# Patient Record
Sex: Female | Born: 1980 | Race: Black or African American | Hispanic: No | Marital: Single | Smoking: Current every day smoker
Health system: Southern US, Community
[De-identification: ages and names within clinical notes are randomized; demographics above are authoritative.]

## PROBLEM LIST (undated history)

## (undated) DIAGNOSIS — Z789 Other specified health status: Secondary | ICD-10-CM

## (undated) DIAGNOSIS — G43909 Migraine, unspecified, not intractable, without status migrainosus: Secondary | ICD-10-CM

## (undated) DIAGNOSIS — F200 Paranoid schizophrenia: Secondary | ICD-10-CM

## (undated) HISTORY — PX: CYST REMOVAL NECK: SHX6281

## (undated) HISTORY — PX: NO PAST SURGERIES: SHX2092

---
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
## 2001-03-03 ENCOUNTER — Emergency Department (HOSPITAL_COMMUNITY): Admission: EM | Admit: 2001-03-03 | Discharge: 2001-03-03 | Payer: Self-pay | Admitting: Emergency Medicine

## 2001-04-06 ENCOUNTER — Other Ambulatory Visit: Admission: RE | Admit: 2001-04-06 | Discharge: 2001-04-06 | Payer: Self-pay | Admitting: *Deleted

## 2001-08-25 ENCOUNTER — Encounter: Admission: RE | Admit: 2001-08-25 | Discharge: 2001-08-25 | Payer: Self-pay | Admitting: Internal Medicine

## 2001-09-15 ENCOUNTER — Encounter: Admission: RE | Admit: 2001-09-15 | Discharge: 2001-09-15 | Payer: Self-pay | Admitting: Internal Medicine

## 2001-11-24 ENCOUNTER — Encounter: Admission: RE | Admit: 2001-11-24 | Discharge: 2001-11-24 | Payer: Self-pay | Admitting: Internal Medicine

## 2002-01-28 ENCOUNTER — Encounter: Admission: RE | Admit: 2002-01-28 | Discharge: 2002-01-28 | Payer: Self-pay | Admitting: Internal Medicine

## 2002-03-16 ENCOUNTER — Encounter: Admission: RE | Admit: 2002-03-16 | Discharge: 2002-03-16 | Payer: Self-pay | Admitting: Internal Medicine

## 2002-03-30 ENCOUNTER — Encounter: Admission: RE | Admit: 2002-03-30 | Discharge: 2002-03-30 | Payer: Self-pay | Admitting: Internal Medicine

## 2002-07-25 ENCOUNTER — Encounter: Admission: RE | Admit: 2002-07-25 | Discharge: 2002-07-25 | Payer: Self-pay | Admitting: Internal Medicine

## 2002-10-12 ENCOUNTER — Ambulatory Visit (HOSPITAL_COMMUNITY): Admission: RE | Admit: 2002-10-12 | Discharge: 2002-10-12 | Payer: Self-pay | Admitting: Internal Medicine

## 2003-02-18 HISTORY — PX: CYST REMOVAL NECK: SHX6281

## 2003-03-09 ENCOUNTER — Inpatient Hospital Stay (HOSPITAL_COMMUNITY): Admission: AD | Admit: 2003-03-09 | Discharge: 2003-03-11 | Payer: Self-pay | Admitting: Internal Medicine

## 2003-03-09 ENCOUNTER — Encounter (INDEPENDENT_AMBULATORY_CARE_PROVIDER_SITE_OTHER): Payer: Self-pay | Admitting: Specialist

## 2003-10-09 ENCOUNTER — Encounter (INDEPENDENT_AMBULATORY_CARE_PROVIDER_SITE_OTHER): Payer: Self-pay | Admitting: Specialist

## 2003-10-09 ENCOUNTER — Ambulatory Visit (HOSPITAL_COMMUNITY): Admission: RE | Admit: 2003-10-09 | Discharge: 2003-10-09 | Payer: Self-pay | Admitting: Otolaryngology

## 2003-10-09 ENCOUNTER — Ambulatory Visit (HOSPITAL_BASED_OUTPATIENT_CLINIC_OR_DEPARTMENT_OTHER): Admission: RE | Admit: 2003-10-09 | Discharge: 2003-10-09 | Payer: Self-pay | Admitting: Otolaryngology

## 2003-10-22 ENCOUNTER — Emergency Department (HOSPITAL_COMMUNITY): Admission: EM | Admit: 2003-10-22 | Discharge: 2003-10-22 | Payer: Self-pay | Admitting: Emergency Medicine

## 2007-06-04 ENCOUNTER — Emergency Department (HOSPITAL_COMMUNITY): Admission: EM | Admit: 2007-06-04 | Discharge: 2007-06-04 | Payer: Self-pay | Admitting: Emergency Medicine

## 2007-12-08 ENCOUNTER — Emergency Department (HOSPITAL_COMMUNITY): Admission: EM | Admit: 2007-12-08 | Discharge: 2007-12-08 | Payer: Self-pay | Admitting: Emergency Medicine

## 2009-02-01 ENCOUNTER — Inpatient Hospital Stay (HOSPITAL_COMMUNITY): Admission: AD | Admit: 2009-02-01 | Discharge: 2009-02-01 | Payer: Self-pay | Admitting: Obstetrics & Gynecology

## 2009-02-28 ENCOUNTER — Encounter: Payer: Self-pay | Admitting: Obstetrics & Gynecology

## 2009-02-28 ENCOUNTER — Ambulatory Visit: Payer: Self-pay | Admitting: Family Medicine

## 2009-02-28 LAB — CONVERTED CEMR LAB
Antibody Screen: NEGATIVE
Basophils Absolute: 0 10*3/uL (ref 0.0–0.1)
Basophils Relative: 0 % (ref 0–1)
Eosinophils Absolute: 0.2 10*3/uL (ref 0.0–0.7)
Eosinophils Relative: 2 % (ref 0–5)
HCT: 37.9 % (ref 36.0–46.0)
Hemoglobin: 12.7 g/dL (ref 12.0–15.0)
Lymphocytes Relative: 19 % (ref 12–46)
MCHC: 33.5 g/dL (ref 30.0–36.0)
MCV: 85.2 fL (ref 78.0–100.0)
Monocytes Absolute: 0.6 10*3/uL (ref 0.1–1.0)
Platelets: 251 10*3/uL (ref 150–400)
RDW: 13.7 % (ref 11.5–15.5)
Rh Type: POSITIVE

## 2009-03-05 ENCOUNTER — Other Ambulatory Visit: Admission: RE | Admit: 2009-03-05 | Discharge: 2009-03-05 | Payer: Self-pay | Admitting: Obstetrics and Gynecology

## 2009-03-05 ENCOUNTER — Ambulatory Visit: Payer: Self-pay | Admitting: Obstetrics and Gynecology

## 2009-03-07 ENCOUNTER — Ambulatory Visit (HOSPITAL_COMMUNITY): Admission: RE | Admit: 2009-03-07 | Discharge: 2009-03-07 | Payer: Self-pay | Admitting: Obstetrics & Gynecology

## 2009-03-29 ENCOUNTER — Ambulatory Visit: Payer: Self-pay | Admitting: Obstetrics & Gynecology

## 2009-04-05 ENCOUNTER — Ambulatory Visit: Payer: Self-pay | Admitting: Obstetrics & Gynecology

## 2009-05-01 ENCOUNTER — Ambulatory Visit: Payer: Self-pay | Admitting: Obstetrics and Gynecology

## 2009-05-01 ENCOUNTER — Encounter: Payer: Self-pay | Admitting: Obstetrics & Gynecology

## 2009-05-01 LAB — CONVERTED CEMR LAB
MCHC: 33.6 g/dL (ref 30.0–36.0)
RBC: 4.2 M/uL (ref 3.87–5.11)
RDW: 14.3 % (ref 11.5–15.5)

## 2009-05-23 ENCOUNTER — Ambulatory Visit: Payer: Self-pay | Admitting: Obstetrics & Gynecology

## 2009-06-26 ENCOUNTER — Ambulatory Visit: Payer: Self-pay | Admitting: Obstetrics & Gynecology

## 2009-06-26 ENCOUNTER — Encounter: Payer: Self-pay | Admitting: Obstetrics & Gynecology

## 2009-06-26 LAB — CONVERTED CEMR LAB: Chlamydia, DNA Probe: NEGATIVE

## 2009-06-27 ENCOUNTER — Encounter: Payer: Self-pay | Admitting: Obstetrics & Gynecology

## 2009-07-04 ENCOUNTER — Ambulatory Visit: Payer: Self-pay | Admitting: Obstetrics & Gynecology

## 2009-07-24 ENCOUNTER — Ambulatory Visit (HOSPITAL_COMMUNITY): Admission: RE | Admit: 2009-07-24 | Discharge: 2009-07-25 | Payer: Self-pay | Admitting: Family Medicine

## 2009-07-25 ENCOUNTER — Ambulatory Visit: Payer: Self-pay | Admitting: Advanced Practice Midwife

## 2009-07-25 ENCOUNTER — Inpatient Hospital Stay (HOSPITAL_COMMUNITY): Admission: AD | Admit: 2009-07-25 | Discharge: 2009-07-27 | Payer: Self-pay | Admitting: Obstetrics & Gynecology

## 2009-07-25 ENCOUNTER — Ambulatory Visit: Payer: Self-pay | Admitting: Family Medicine

## 2009-09-11 ENCOUNTER — Ambulatory Visit: Payer: Self-pay | Admitting: Obstetrics & Gynecology

## 2010-05-06 LAB — RPR: RPR Ser Ql: NONREACTIVE

## 2010-05-06 LAB — CBC
MCHC: 33.5 g/dL (ref 30.0–36.0)
MCV: 88.4 fL (ref 78.0–100.0)
Platelets: 200 10*3/uL (ref 150–400)
WBC: 13.9 10*3/uL — ABNORMAL HIGH (ref 4.0–10.5)

## 2010-05-20 LAB — WET PREP, GENITAL
Clue Cells Wet Prep HPF POC: NONE SEEN
Trich, Wet Prep: NONE SEEN

## 2010-05-20 LAB — GC/CHLAMYDIA PROBE AMP, GENITAL: Chlamydia, DNA Probe: NEGATIVE

## 2010-05-20 LAB — URINALYSIS, ROUTINE W REFLEX MICROSCOPIC
Bilirubin Urine: NEGATIVE
Ketones, ur: NEGATIVE mg/dL
Nitrite: POSITIVE — AB
Urobilinogen, UA: 0.2 mg/dL (ref 0.0–1.0)

## 2010-07-02 NOTE — Assessment & Plan Note (Signed)
Angelica [name], Angelica [name]                 ACCOUNT NO.:  [id number]   MEDICAL RECORD NO.:  [id number]          PATIENT TYPE:  POB   LOCATION:  [facility] at [facility]         FACILITY:  [facility]   PHYSICIAN:  Scheryl Darter, MD       DATE OF BIRTH:  1980-05-11   DATE OF SERVICE:  09/11/2009                                  CLINIC NOTE   The patient is postpartum from vaginal delivery 6 weeks ago.  Infant is  doing well.  She delivered a live born female, 8 pounds and 6 ounces on  July 25, 2009.  No complications.  She has abstained from intercourse  since delivery.  She had some bleeding for about 3 weeks and now has a  mucus discharge.  There have been no complaints of itching or odor.  She  says she thinks she may have had some postpartum depression with her  first delivery, but she feels like she is doing better this time.  The  patient had a LSIL Pap during this pregnancy and colposcopy was done in  February and plan was to repeat colposcopy postpartum.   PHYSICAL EXAMINATION:  GENERAL:  The patient's affect appears normal.  VITAL SIGNS:  Weight is 198 pounds, height 64 inches, blood pressure  114/89.  ABDOMEN:  Soft, nontender, no mass.  PELVIC:  External genitalia, vagina and cervix showed some slight mucus  at os, but no abnormal discharge.  Uterus normal size, nontender, no  mass.   IMPRESSION:  1. The patient is doing well postpartum.  2. The patient wishes to start oral contraceptives with history of      abnormal Pap smear.   PLAN:  The patient will return for repeat colposcopy.  Gave prescription  for [facility] as requested.  She scored 8 on the postnatal depression  scale which is normal.      Scheryl Darter, MD     JA/MEDQ  D:  09/11/2009  T:  09/12/2009  Job:  [id number]

## 2010-07-05 NOTE — Op Note (Signed)
NAME:  Angelica [name], Angelica [name]                           ACCOUNT NO.:  [id number]   MEDICAL RECORD NO.:  [id number]                   PATIENT TYPE:  AMB   LOCATION:  [facility]                                  FACILITY:  [facility]   PHYSICIAN:  Jefry H. Pollyann Kennedy, M.D.                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   PROCEDURE:  __________ procedure.   HISTORY:  This [name] 30 year old with [name] history of an anterior neck mass.  Risks, benefits, alternatives and complications of the procedure were  explained to the patient who seemed to understand and agreed with surgery.   PROCEDURE:  The patient was taken to the operating room and placed on the  operating table in the supine position.  Following induction of general  endotracheal anesthesia, the anterior neck was prepped and draped in [name]  standard fashion.  [name] transverse incision was outlined in [name] cervical skin  crease just above the thyroid notch.  Incision was created using  electrocautery and electrocautery was then used to dissect through the  superficial fascia.  The strap muscles were identified and reflected  laterally.  Just above the thyroid notch was [name] cystic mass.  The mass was  mobile and slightly to the left of midline.  Careful dissection around the  mass was accomplished to remove the mass in its entirety without disturbing  any surrounding structures.  During the dissection, during manipulation of  the mass, [name] small tear created and cloudy mucoid type material was produced  within the mass and this was suctioned from the operative field.  The  dissection continued up to the attachment to the midline of the hyoid bone.  The muscles were dissected off of the hyoid laterally to the  lesser horns  and superiorly and inferiorly.  [name] bone cutting rongeur was used to divide  the hyoid on either side at the lesser horn and this was kept in continuity  with the cyst.  Dissection superior to the hyoid through the muscle layers  did not reveal any extension of the cyst or any type of sinus tract.  The  specimen was delivered from the wound and sent for pathologic evaluation.  The wound was irrigated with saline.  Hemostasis was completed using  electrocautery and the wound was then closed in layers with [name] rubber band  drain in place.  The strap muscles were reapproximated using 4-0 chromic  suture as well as the platysma layer.  The skin was reapproximated with  running 5-0 nylon.  The drain was secured in place with nylon as well.  [name]  sterile dressing was applied.  The patient was  then awakened, extubated and  transferred to recovery in stable condition.                                               Jefry H. Pollyann Kennedy, M.D.    JHR/MEDQ  D:  10/09/2003  T:  10/09/2003  Job:  [id number]

## 2012-01-17 ENCOUNTER — Encounter (HOSPITAL_COMMUNITY): Payer: Self-pay | Admitting: Emergency Medicine

## 2012-01-17 ENCOUNTER — Emergency Department (HOSPITAL_COMMUNITY)
Admission: EM | Admit: 2012-01-17 | Discharge: 2012-01-17 | Disposition: A | Discharge status: Home / Self Care | Payer: Medicaid Other | Attending: Emergency Medicine | Admitting: Emergency Medicine

## 2012-01-17 DIAGNOSIS — Z3202 Encounter for pregnancy test, result negative: Secondary | ICD-10-CM | POA: Insufficient documentation

## 2012-01-17 DIAGNOSIS — F17200 Nicotine dependence, unspecified, uncomplicated: Secondary | ICD-10-CM | POA: Insufficient documentation

## 2012-01-17 DIAGNOSIS — R300 Dysuria: Secondary | ICD-10-CM | POA: Insufficient documentation

## 2012-01-17 DIAGNOSIS — R109 Unspecified abdominal pain: Secondary | ICD-10-CM | POA: Insufficient documentation

## 2012-01-17 LAB — URINE MICROSCOPIC-ADD ON

## 2012-01-17 LAB — URINALYSIS, ROUTINE W REFLEX MICROSCOPIC
Glucose, UA: NEGATIVE mg/dL
Protein, ur: NEGATIVE mg/dL
pH: 6.5 (ref 5.0–8.0)

## 2012-01-17 LAB — PREGNANCY, URINE: Preg Test, Ur: NEGATIVE

## 2012-01-17 LAB — WET PREP, GENITAL
Clue Cells Wet Prep HPF POC: NONE SEEN
Trich, Wet Prep: NONE SEEN

## 2012-01-17 MED ORDER — HYDROCODONE-ACETAMINOPHEN 5-500 MG PO TABS: 1.0000 | ORAL_TABLET | Freq: Four times a day (QID) | ORAL | Status: DC | PRN

## 2012-01-17 NOTE — ED Notes (Signed)
Lab called to advised that the GC/Chlamydia swap needs to be redone with blue swab instead of white.  Advised PA - will repeat.

## 2012-01-17 NOTE — ED Provider Notes (Signed)
Medical screening examination/treatment/procedure(s) were performed by non-physician practitioner and as supervising physician I was immediately available for consultation/collaboration.   Dione Booze, MD 01/17/12 [phone]

## 2012-01-17 NOTE — ED Provider Notes (Signed)
History     CSN: [id number]  Arrival date & time 01/17/12  4098   First MD Initiated Contact with Patient 01/17/12 [phone]      Chief Complaint  Patient presents with  . Abdominal Pain    (Consider location/radiation/quality/duration/timing/severity/associated sxs/prior treatment) HPI  31 year old female with no significant medical history is for evaluations of dysuria.  Patient reports gradual onset of intermittent low abdomen pressure sensation along with the urge to urinate but having difficulty for the past 3 days.  And also affect low back..  No fever, Chills, nausea, vomiting, diarrhea, burning urination, vaginal discharge, or rash.  LMP Nov 21st.  No hx of diabetes, or kidney stone.  Has tried Azo at home with minimal relief.    History reviewed. No pertinent past medical history.  History reviewed. No pertinent past surgical history.  No family history on file.  History  Substance Use Topics  . Smoking status: Current Every Day Smoker -- 0.5 packs/day for 5 years    Types: Cigarettes  . Smokeless tobacco: Not on file  . Alcohol Use: Yes    OB History    Grav Para Term Preterm Abortions TAB SAB Ect Mult Living                  Review of Systems  Constitutional: Negative for fever.  Gastrointestinal: Negative for nausea, vomiting and diarrhea.  Genitourinary: Positive for dysuria, flank pain and difficulty urinating. Negative for vaginal bleeding, vaginal discharge and pelvic pain.  Skin: Negative for rash.  Neurological: Negative for numbness.    Allergies  Review of patient's allergies indicates no known allergies.  Home Medications   Current Outpatient Rx  Name  Route  Sig  Dispense  Refill  . AZO TABS PO   Oral   Take 1 tablet by mouth 2 (two) times daily.           BP 120/59  Pulse 86  Temp 98.1 F (36.7 C) (Oral)  Resp 20  Ht 5\' 3"  (1.6 m)  Wt 190 lb (86.183 kg)  BMI 33.66 kg/m2  SpO2 98%  LMP 01/08/2012  Physical Exam  Nursing note  and vitals reviewed. Constitutional: She appears well-developed and well-nourished. No distress.  HENT:  Head: Normocephalic and atraumatic.  Eyes: Conjunctivae normal are normal.  Neck: Normal range of motion. Neck supple.  Cardiovascular: Normal rate and regular rhythm.   Pulmonary/Chest: Effort normal and breath sounds normal. She exhibits no tenderness.  Abdominal: Soft. There is tenderness (mild tenderness to LLQ without guarding, rebound, or hernia noted.) in the left lower quadrant. There is no rebound, no CVA tenderness, no tenderness at McBurney's point and negative Murphy's sign.       No CVA tenderness  Genitourinary: Vagina normal and uterus normal. There is no rash or lesion on the right labia. There is no rash or lesion on the left labia. Cervix exhibits no motion tenderness and no discharge. Right adnexum displays no mass and no tenderness. Left adnexum displays no mass and no tenderness. No erythema, tenderness or bleeding around the vagina. No vaginal discharge found.       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  Lymphadenopathy:       Right: No inguinal adenopathy present.       Left: No inguinal adenopathy present.    ED Course  Procedures (including critical care time)   Labs Reviewed  URINALYSIS, ROUTINE W  REFLEX MICROSCOPIC  PREGNANCY, URINE  WET PREP, GENITAL  GC/CHLAMYDIA PROBE AMP   No results found.   No diagnosis found.  Results for orders placed during the hospital encounter of 01/17/12  URINALYSIS, ROUTINE W REFLEX MICROSCOPIC      Component Value Range   Color, Urine YELLOW  YELLOW   APPearance CLEAR  CLEAR   Specific Gravity, Urine 1.021  1.005 - 1.030   pH 6.5  5.0 - 8.0   Glucose, UA NEGATIVE  NEGATIVE mg/dL   Hgb urine dipstick NEGATIVE  NEGATIVE   Bilirubin Urine NEGATIVE  NEGATIVE   Ketones, ur NEGATIVE  NEGATIVE mg/dL   Protein, ur NEGATIVE  NEGATIVE mg/dL    Urobilinogen, UA 1.0  0.0 - 1.0 mg/dL   Nitrite NEGATIVE  NEGATIVE   Leukocytes, UA TRACE (*) NEGATIVE  PREGNANCY, URINE      Component Value Range   Preg Test, Ur NEGATIVE  NEGATIVE  WET PREP, GENITAL      Component Value Range   Yeast Wet Prep HPF POC NONE SEEN  NONE SEEN   Trich, Wet Prep NONE SEEN  NONE SEEN   Clue Cells Wet Prep HPF POC NONE SEEN  NONE SEEN   WBC, Wet Prep HPF POC MANY (*) NONE SEEN  URINE MICROSCOPIC-ADD ON      Component Value Range   Squamous Epithelial / LPF MANY (*) RARE   WBC, UA 0-2  <3 WBC/hpf   RBC / HPF 0-2  <3 RBC/hpf   Bacteria, UA RARE  RARE   No results found.    MDM  Pt presents with dysuria and pain to her LLQ. Non surgical abdomen, afebrile, VSS.  Will check UA, pelvic exam, pregnancy test.    1:13 PM Pelvic examination is unremarkable, low suspicion for PID. Doubt ovarian torsion, tuboovarian abscess, diverticulitis.  Doubt kidney stone.  UA shows no evidence of UTI, preg test neg.  GC/Chlamydia culture sent.  I recommend pt to f/u with PCP for further evauation.  Strict return precaution including fever, persistent vomit, worsening abd pain.  Pt voice understanding and agrees with plan.  Able to tolerates PO.  Stable for discharge.    BP 97/48  Pulse 58  Temp 98.1 F (36.7 C) (Oral)  Resp 18  Ht 5\' 3"  (1.6 m)  Wt 190 lb (86.183 kg)  BMI 33.66 kg/m2  SpO2 100%  LMP 01/08/2012   I have reviewed nursing notes and vital signs.  I reviewed available ER/hospitalization records thought the EMR       Fayrene Helper, [place] 01/17/12 1318

## 2012-01-17 NOTE — ED Notes (Signed)
Patient claims having trouble urinating.   Has been taking AZO since Thursday without relief.  Patient claims LLQ pain into L flank pain.

## 2012-01-18 ENCOUNTER — Inpatient Hospital Stay (HOSPITAL_COMMUNITY)
Admission: AD | Admit: 2012-01-18 | Discharge: 2012-01-18 | Disposition: A | Discharge status: Home / Self Care | Payer: Medicaid Other | Source: Ambulatory Visit | Attending: Obstetrics & Gynecology | Admitting: Obstetrics & Gynecology

## 2012-01-18 ENCOUNTER — Encounter (HOSPITAL_COMMUNITY): Payer: Self-pay | Admitting: Obstetrics and Gynecology

## 2012-01-18 ENCOUNTER — Inpatient Hospital Stay (HOSPITAL_COMMUNITY): Payer: Medicaid Other

## 2012-01-18 DIAGNOSIS — K5900 Constipation, unspecified: Secondary | ICD-10-CM

## 2012-01-18 DIAGNOSIS — R1032 Left lower quadrant pain: Secondary | ICD-10-CM

## 2012-01-18 DIAGNOSIS — R350 Frequency of micturition: Secondary | ICD-10-CM | POA: Insufficient documentation

## 2012-01-18 HISTORY — DX: Other specified health status: Z78.9

## 2012-01-18 LAB — URINALYSIS, ROUTINE W REFLEX MICROSCOPIC
Bilirubin Urine: NEGATIVE
Hgb urine dipstick: NEGATIVE
Ketones, ur: 15 mg/dL — AB
Specific Gravity, Urine: 1.02 (ref 1.005–1.030)
Urobilinogen, UA: 0.2 mg/dL (ref 0.0–1.0)
pH: 6 (ref 5.0–8.0)

## 2012-01-18 MED ORDER — POLYETHYLENE GLYCOL 3350 17 G PO PACK: 17.0000 g | PACK | Freq: Every day | ORAL | Status: DC

## 2012-01-18 MED ORDER — KETOROLAC TROMETHAMINE 60 MG/2ML IM SOLN
60.0000 mg | Freq: Once | INTRAMUSCULAR | Status: AC
  Administered 2012-01-18: 60 mg via INTRAMUSCULAR
  Filled 2012-01-18: qty 2

## 2012-01-18 MED ORDER — PHOSPHATE ENEMA 7-19 GM/118ML RE ENEM: 1.0000 | ENEMA | Freq: Once | RECTAL | Status: DC | PRN

## 2012-01-18 NOTE — MAU Note (Signed)
"  I have been having pain in my LLQ since Wednesday or Thursday.  It is like a spasm feeling that comes and goes.  It aches and I'm having trouble sleeping at night.  It is difficult passing urine for the past couple of days.  I have run water in order to be able to use the BR.  No N/V/D."

## 2012-01-18 NOTE — MAU Note (Signed)
Pt reports having LLQ pain that has been for the past 4 days. Feels like a knott in her side. Hurts more in the morning and evening time. Though it may be a UTI went to MD on Friday and it was ruled out. Still having pain. Pt taking AZO for pain but not helping much. Took some Ibuprofen with some relief.

## 2012-01-18 NOTE — MAU Provider Note (Signed)
History     CSN: [id number]  Arrival date and time: 01/18/12 4098   First Provider Initiated Contact with Patient 01/18/12 [phone]      Chief Complaint  Patient presents with  . Abdominal Pain   HPI 31 y.o. [id number] with left sided pelvic pain x 3-4 days, also c/o urinary frequency, urgency and hesitancy. Denies hematuria, vaginal discharge, fever, chills, n/v. + Constipation - no BM in 3-4 days. Pt was evaluated yesterday at [facility] - UPT, urinalysis, wet prep and GC/CT were all negative. Pain continues, however.    Past Medical History  Diagnosis Date  . No pertinent past medical history     Past Surgical History  Procedure Date  . Cyst removal neck     History reviewed. No pertinent family history.  History  Substance Use Topics  . Smoking status: Current Every Day Smoker -- 0.5 packs/day for 5 years    Types: Cigarettes  . Smokeless tobacco: Not on file  . Alcohol Use: 0.0 oz/week    7-8 Glasses of wine per week    Allergies: No Known Allergies  Prescriptions prior to admission  Medication Sig Dispense Refill  . ibuprofen (ADVIL,MOTRIN) 200 MG tablet Take 800 mg by mouth every 6 (six) hours as needed.        Review of Systems  Constitutional: Negative.   Respiratory: Negative.   Cardiovascular: Negative.   Gastrointestinal: Positive for abdominal pain and constipation. Negative for nausea, vomiting and diarrhea.  Genitourinary: Positive for urgency and frequency. Negative for dysuria, hematuria and flank pain.       Negative for vaginal bleeding, vaginal discharge  Musculoskeletal: Negative.   Neurological: Negative.   Psychiatric/Behavioral: Negative.    Physical Exam   Blood pressure 133/63, pulse 87, temperature 97.8 F (36.6 C), temperature source Oral, resp. rate 18, height 5\' 3"  (1.6 m), weight 189 lb 3.2 oz (85.821 kg), last menstrual period 01/08/2012.  Physical Exam  Nursing note and vitals reviewed. Constitutional: She is oriented to person,  place, and time. She appears well-developed and well-nourished. No distress.  Cardiovascular: Normal rate.   Respiratory: Effort normal.  GI: Soft. She exhibits no distension and no mass. There is tenderness (LLQ). There is no rebound and no guarding.  Musculoskeletal: Normal range of motion.  Neurological: She is alert and oriented to person, place, and time.  Skin: Skin is warm and dry.  Psychiatric: She has a normal mood and affect.    MAU Course  Procedures  Results for orders placed during the hospital encounter of 01/18/12 (from the past 24 hour(s))  URINALYSIS, ROUTINE W REFLEX MICROSCOPIC     Status: Abnormal   Collection Time   01/18/12 10:42 AM      Component Value Range   Color, Urine YELLOW  YELLOW   APPearance CLEAR  CLEAR   Specific Gravity, Urine 1.020  1.005 - 1.030   pH 6.0  5.0 - 8.0   Glucose, UA NEGATIVE  NEGATIVE mg/dL   Hgb urine dipstick NEGATIVE  NEGATIVE   Bilirubin Urine NEGATIVE  NEGATIVE   Ketones, ur 15 (*) NEGATIVE mg/dL   Protein, ur NEGATIVE  NEGATIVE mg/dL   Urobilinogen, UA 0.2  0.0 - 1.0 mg/dL   Nitrite NEGATIVE  NEGATIVE   Leukocytes, UA NEGATIVE  NEGATIVE   US Transvaginal Non-ob  01/18/2012  *RADIOLOGY REPORT*  Clinical Data: Left lower quadrant pain for 4 days.  LMP 01/08/2012.  TRANSABDOMINAL AND TRANSVAGINAL ULTRASOUND OF PELVIS Technique:  Both transabdominal and transvaginal  ultrasound examinations of the pelvis were performed. Transabdominal technique was performed for global imaging of the pelvis including uterus, ovaries, adnexal regions, and pelvic cul-de-sac.  It was necessary to proceed with endovaginal exam following the transabdominal exam to visualize the adnexa.  Comparison:  None  Findings:  Uterus: Measures 7.9 x 4.6 x 5.2 cm.  Anteverted.  Within normal limits for echotexture.  No focal mass.  Endometrium: Normal in thickness and appearance.  Measures 8.8 mm.  Right ovary:  Normal appearance/no adnexal mass.  Measures 3.9 x 3.1 x  2.7 cm and contains a dominant follicle.  Left ovary: Normal appearance/no adnexal mass.  Measures 3.3 x 1.6 x 2.6 cm.  Other findings: No free fluid  IMPRESSION: Normal study. No evidence of pelvic mass or other significant abnormality.   Original Report Authenticated By: Britta Mccreedy, M.D.    US Pelvis Complete  01/18/2012  *RADIOLOGY REPORT*  Clinical Data: Left lower quadrant pain for 4 days.  LMP 01/08/2012.  TRANSABDOMINAL AND TRANSVAGINAL ULTRASOUND OF PELVIS Technique:  Both transabdominal and transvaginal ultrasound examinations of the pelvis were performed. Transabdominal technique was performed for global imaging of the pelvis including uterus, ovaries, adnexal regions, and pelvic cul-de-sac.  It was necessary to proceed with endovaginal exam following the transabdominal exam to visualize the adnexa.  Comparison:  None  Findings:  Uterus: Measures 7.9 x 4.6 x 5.2 cm.  Anteverted.  Within normal limits for echotexture.  No focal mass.  Endometrium: Normal in thickness and appearance.  Measures 8.8 mm.  Right ovary:  Normal appearance/no adnexal mass.  Measures 3.9 x 3.1 x 2.7 cm and contains a dominant follicle.  Left ovary: Normal appearance/no adnexal mass.  Measures 3.3 x 1.6 x 2.6 cm.  Other findings: No free fluid  IMPRESSION: Normal study. No evidence of pelvic mass or other significant abnormality.   Original Report Authenticated By: Britta Mccreedy, M.D.     Assessment and Plan   1. Left lower quadrant pain   2. Constipation       Medication List     As of 01/18/2012  3:20 PM    START taking these medications         phosphate enema   Commonly known as: FLEET   Place 1 enema rectally once as needed for constipation.      polyethylene glycol packet   Commonly known as: MIRALAX / GLYCOLAX   Take 17 g by mouth daily.      CONTINUE taking these medications         ibuprofen 200 MG tablet   Commonly known as: ADVIL,MOTRIN          Where to get your medications    These  are the prescriptions that you need to pick up. We sent them to a specific pharmacy, so you will need to go there to get them.   [facility] [address] [place], [place] [address]    [address]  [address]    Phone: [phone]        phosphate enema   polyethylene glycol packet            Follow-up Information    Follow up with a primary care doctor. (As needed)            Navjot Pilgrim 01/18/2012, 9:45 AM

## 2012-01-21 NOTE — MAU Provider Note (Signed)
Attestation of Attending Supervision of Advanced Practitioner (CNM/NP): Evaluation and management procedures were performed by the Advanced Practitioner under my supervision and collaboration. I have reviewed the Advanced Practitioner's note and chart, and I agree with the management and plan.  Akai Dollard H. 9:00 PM   

## 2012-05-08 ENCOUNTER — Encounter (HOSPITAL_COMMUNITY): Payer: Self-pay | Admitting: *Deleted

## 2012-05-08 ENCOUNTER — Emergency Department (HOSPITAL_COMMUNITY): Payer: Medicaid Other

## 2012-05-08 ENCOUNTER — Emergency Department (HOSPITAL_COMMUNITY)
Admission: EM | Admit: 2012-05-08 | Discharge: 2012-05-08 | Disposition: A | Discharge status: Home / Self Care | Payer: Medicaid Other | Attending: Emergency Medicine | Admitting: Emergency Medicine

## 2012-05-08 DIAGNOSIS — R51 Headache: Secondary | ICD-10-CM

## 2012-05-08 DIAGNOSIS — R5381 Other malaise: Secondary | ICD-10-CM | POA: Insufficient documentation

## 2012-05-08 DIAGNOSIS — Z3202 Encounter for pregnancy test, result negative: Secondary | ICD-10-CM | POA: Insufficient documentation

## 2012-05-08 DIAGNOSIS — R42 Dizziness and giddiness: Secondary | ICD-10-CM | POA: Insufficient documentation

## 2012-05-08 DIAGNOSIS — R5383 Other fatigue: Secondary | ICD-10-CM | POA: Insufficient documentation

## 2012-05-08 DIAGNOSIS — F17200 Nicotine dependence, unspecified, uncomplicated: Secondary | ICD-10-CM | POA: Insufficient documentation

## 2012-05-08 LAB — POCT I-STAT, CHEM 8
BUN: 10 mg/dL (ref 6–23)
Creatinine, Ser: 0.8 mg/dL (ref 0.50–1.10)
Glucose, Bld: 105 mg/dL — ABNORMAL HIGH (ref 70–99)
Potassium: 3.8 mEq/L (ref 3.5–5.1)
Sodium: 142 mEq/L (ref 135–145)

## 2012-05-08 MED ORDER — HYDROCODONE-ACETAMINOPHEN 5-325 MG PO TABS
2.0000 | ORAL_TABLET | Freq: Once | ORAL | Status: AC
  Administered 2012-05-08: 2 via ORAL
  Filled 2012-05-08: qty 2

## 2012-05-08 MED ORDER — HYDROCODONE-ACETAMINOPHEN 5-325 MG PO TABS: 2.0000 | ORAL_TABLET | Freq: Four times a day (QID) | ORAL | Status: DC | PRN

## 2012-05-08 NOTE — ED Notes (Signed)
MD at bedside. 

## 2012-05-08 NOTE — ED Notes (Signed)
Pt instructed to notify RN when she is able to void for urine specimen.

## 2012-05-08 NOTE — ED Notes (Signed)
Pt has been has been having increasingly painful headaches x 1.5 months.  Pt describes pain as "spasms" that last for an hour and then go away.  States pain to entire. Pt photophobic, dizzy and lightheaded but denies nausea.  No relief with ibuprofen.

## 2012-05-08 NOTE — ED Provider Notes (Signed)
History     CSN: [id number]  Arrival date & time 05/08/12  1251   First MD Initiated Contact with Patient 05/08/12 1329      Chief Complaint  Patient presents with  . Headache    (Consider location/radiation/quality/duration/timing/severity/associated sxs/prior treatment) HPI [place] of diffuse headache dull in nature, constant, accompanied by generalized weakness onset gradually 1.5 months ago. She has to do herself with ibuprofen with partial relief symptoms are accompanied by lightheadedness, sensation of vertigo intermittently. She is not vertiginous now. She is treated herself with ibuprofen with partial relief. She presents today as pain is worse over the past few days. No fever no other associated symptoms. Pain is worsened with loud noises improved after treatment with ibuprofen. Last normal menstrual period 5 days ago. She is currently on her menses Past Medical History  Diagnosis Date  . No pertinent past medical history     Past Surgical History  Procedure Laterality Date  . Cyst removal neck      No family history on file.  History  Substance Use Topics  . Smoking status: Current Every Day Smoker -- 0.50 packs/day for 5 years    Types: Cigarettes  . Smokeless tobacco: Not on file  . Alcohol Use: 0.0 oz/week    7-8 Glasses of wine per week    OB History   Grav Para Term Preterm Abortions TAB SAB Ect Mult Living   2 2 2       2       Review of Systems  HENT:       Hyperacusis  Neurological: Positive for dizziness, light-headedness and headaches. Negative for weakness.  All other systems reviewed and are negative.    Allergies  Review of patient's allergies indicates no known allergies.  Home Medications   Current Outpatient Rx  Name  Route  Sig  Dispense  Refill  . ibuprofen (ADVIL,MOTRIN) 200 MG tablet   Oral   Take 600-800 mg by mouth every 6 (six) hours as needed for pain.            BP 131/71  Pulse 69  Temp(Src) 98.2 F (36.8 C)  (Oral)  Resp 16  SpO2 99%  LMP 05/03/2012  Physical Exam  Nursing note and vitals reviewed. Constitutional: She is oriented to person, place, and time. She appears well-developed and well-nourished.  HENT:  Head: Normocephalic and atraumatic.  Eyes: Conjunctivae are normal. Pupils are equal, round, and reactive to light.  Fundi benign  Neck: Neck supple. No tracheal deviation present. No thyromegaly present.  Cardiovascular: Normal rate and regular rhythm.   No murmur heard. Pulmonary/Chest: Effort normal and breath sounds normal.  Abdominal: Soft. Bowel sounds are normal. She exhibits no distension. There is no tenderness.  Musculoskeletal: Normal range of motion. She exhibits no edema and no tenderness.  Neurological: She is alert and oriented to person, place, and time. She has normal reflexes. Coordination normal.  Gait normal  Normal Romberg normal pronator drift normal. Not lightheaded on standing.  Skin: Skin is warm and dry. No rash noted.  Psychiatric: She has a normal mood and affect.    ED Course  Procedures (including critical care time)  Labs Reviewed - No data to display No results found.   No diagnosis found. 4:30 PM pain improved after treatment with Norco. Results for orders placed during the hospital encounter of 05/08/12  POCT PREGNANCY, URINE      Result Value Range   Preg Test, Ur NEGATIVE  NEGATIVE  POCT I-STAT, CHEM 8      Result Value Range   Sodium 142  135 - 145 mEq/L   Potassium 3.8  3.5 - 5.1 mEq/L   Chloride 107  96 - 112 mEq/L   BUN 10  6 - 23 mg/dL   Creatinine, Ser 1.61  0.50 - 1.10 mg/dL   Glucose, Bld 096 (*) 70 - 99 mg/dL   Calcium, Ion 0.45  4.09 - 1.23 mmol/L   TCO2 26  0 - 100 mmol/L   Hemoglobin 15.0  12.0 - 15.0 g/dL   HCT 81.1  91.4 - 78.2 %   Ct Head Wo Contrast  05/08/2012  *RADIOLOGY REPORT*  Clinical Data: Headaches.  Increasing in severity.  Photophobia  CT HEAD WITHOUT CONTRAST  Technique:  Contiguous axial images  were obtained from the base of the skull through the vertex without contrast.  Comparison: None  Findings: No acute intracranial hemorrhage.  No focal mass lesion. No CT evidence of acute infarction.   No midline shift or mass effect.  No hydrocephalus.  Basilar cisterns are patent. Paranasal sinuses and mastoid air cells are clear.  Orbits are normal.  IMPRESSION: Normal head CT   Original Report Authenticated By: Genevive Bi, M.D.     MDM  Plan prescription Norco. Referral resource guide Diagnosis#1 nonspecific headache #2 nonspecific weakness        Doug Sou, MD 05/08/12 1640

## 2012-05-08 NOTE — ED Notes (Addendum)
Pt denies feeling like the room is spinning but states she feels lightheaded when she stands up. VSS at this time. Pt ambulatory to room. Pt states she is not sure if HA onset due to new living situation, pt is now living with her parents.

## 2012-05-17 ENCOUNTER — Emergency Department (HOSPITAL_COMMUNITY)
Admission: EM | Admit: 2012-05-17 | Discharge: 2012-05-17 | Discharge status: Against Medical Advice | Payer: Medicaid Other | Attending: Emergency Medicine | Admitting: Emergency Medicine

## 2012-05-17 ENCOUNTER — Encounter (HOSPITAL_COMMUNITY): Payer: Self-pay | Admitting: Emergency Medicine

## 2012-05-17 ENCOUNTER — Telehealth (HOSPITAL_COMMUNITY): Payer: Self-pay | Admitting: Emergency Medicine

## 2012-05-17 DIAGNOSIS — R51 Headache: Secondary | ICD-10-CM | POA: Insufficient documentation

## 2012-05-17 NOTE — ED Notes (Addendum)
Pt here c/o left sided temple HA x several months with some anxiety; family concerned and feels pt is depressed but pt denies wanting any help for this; pt tearful at present; pt sts seen here recently for HA; pt denies SI/HI

## 2012-05-18 ENCOUNTER — Emergency Department (HOSPITAL_COMMUNITY)
Admission: EM | Admit: 2012-05-18 | Discharge: 2012-05-18 | Disposition: A | Discharge status: Home / Self Care | Payer: Medicaid Other | Attending: Emergency Medicine | Admitting: Emergency Medicine

## 2012-05-18 ENCOUNTER — Encounter (HOSPITAL_COMMUNITY): Payer: Self-pay | Admitting: *Deleted

## 2012-05-18 DIAGNOSIS — F32A Depression, unspecified: Secondary | ICD-10-CM

## 2012-05-18 DIAGNOSIS — F919 Conduct disorder, unspecified: Secondary | ICD-10-CM | POA: Insufficient documentation

## 2012-05-18 DIAGNOSIS — F329 Major depressive disorder, single episode, unspecified: Secondary | ICD-10-CM | POA: Insufficient documentation

## 2012-05-18 DIAGNOSIS — R51 Headache: Secondary | ICD-10-CM | POA: Insufficient documentation

## 2012-05-18 DIAGNOSIS — F17200 Nicotine dependence, unspecified, uncomplicated: Secondary | ICD-10-CM | POA: Insufficient documentation

## 2012-05-18 DIAGNOSIS — G479 Sleep disorder, unspecified: Secondary | ICD-10-CM | POA: Insufficient documentation

## 2012-05-18 DIAGNOSIS — F3289 Other specified depressive episodes: Secondary | ICD-10-CM | POA: Insufficient documentation

## 2012-05-18 DIAGNOSIS — Z3202 Encounter for pregnancy test, result negative: Secondary | ICD-10-CM | POA: Insufficient documentation

## 2012-05-18 LAB — COMPREHENSIVE METABOLIC PANEL
ALT: 19 U/L (ref 0–35)
BUN: 13 mg/dL (ref 6–23)
CO2: 26 mEq/L (ref 19–32)
Calcium: 9.7 mg/dL (ref 8.4–10.5)
Creatinine, Ser: 0.91 mg/dL (ref 0.50–1.10)
GFR calc Af Amer: >90 mL/min (ref >90)
GFR calc non Af Amer: 84 mL/min — ABNORMAL LOW (ref >90)
Glucose, Bld: 98 mg/dL (ref 70–99)

## 2012-05-18 LAB — COMPREHENSIVE METABOLIC PANEL WITH GFR
AST: 16 U/L (ref 0–37)
Albumin: 4.2 g/dL (ref 3.5–5.2)
Alkaline Phosphatase: 71 U/L (ref 39–117)
Chloride: 104 meq/L (ref 96–112)
Potassium: 4.4 meq/L (ref 3.5–5.1)
Sodium: 140 meq/L (ref 135–145)
Total Bilirubin: 0.5 mg/dL (ref 0.3–1.2)
Total Protein: 7.6 g/dL (ref 6.0–8.3)

## 2012-05-18 LAB — ACETAMINOPHEN LEVEL: Acetaminophen (Tylenol), Serum: <15 ug/mL (ref 10–30)

## 2012-05-18 LAB — CBC
HCT: 44 % (ref 36.0–46.0)
Hemoglobin: 15.5 g/dL — ABNORMAL HIGH (ref 12.0–15.0)
MCH: 29.5 pg (ref 26.0–34.0)
MCHC: 35.2 g/dL (ref 30.0–36.0)
MCV: 83.7 fL (ref 78.0–100.0)
Platelets: 207 K/uL (ref 150–400)
RBC: 5.26 MIL/uL — ABNORMAL HIGH (ref 3.87–5.11)
RDW: 13.9 % (ref 11.5–15.5)
WBC: 14.3 K/uL — ABNORMAL HIGH (ref 4.0–10.5)

## 2012-05-18 LAB — RAPID URINE DRUG SCREEN, HOSP PERFORMED
Amphetamines: NOT DETECTED
Barbiturates: NOT DETECTED
Benzodiazepines: NOT DETECTED
Cocaine: NOT DETECTED
Opiates: NOT DETECTED
Tetrahydrocannabinol: NOT DETECTED

## 2012-05-18 LAB — SALICYLATE LEVEL: Salicylate Lvl: <2 mg/dL — ABNORMAL LOW (ref 2.8–20.0)

## 2012-05-18 LAB — POCT PREGNANCY, URINE: Preg Test, Ur: NEGATIVE

## 2012-05-18 LAB — ETHANOL: Alcohol, Ethyl (B): <11 mg/dL (ref 0–11)

## 2012-05-18 MED ORDER — ZOLPIDEM TARTRATE 5 MG PO TABS: 5.0000 mg | ORAL_TABLET | Freq: Every evening | ORAL | Status: DC | PRN

## 2012-05-18 MED ORDER — IBUPROFEN 400 MG PO TABS: 600.0000 mg | ORAL_TABLET | Freq: Three times a day (TID) | ORAL | Status: DC | PRN

## 2012-05-18 MED ORDER — LORAZEPAM 1 MG PO TABS: 1.0000 mg | ORAL_TABLET | Freq: Three times a day (TID) | ORAL | Status: DC | PRN

## 2012-05-18 MED ORDER — KETOROLAC TROMETHAMINE 30 MG/ML IJ SOLN
30.0000 mg | Freq: Once | INTRAMUSCULAR | Status: AC
  Administered 2012-05-18: 30 mg via INTRAVENOUS
  Filled 2012-05-18: qty 1

## 2012-05-18 MED ORDER — METOCLOPRAMIDE HCL 5 MG/ML IJ SOLN
10.0000 mg | Freq: Once | INTRAMUSCULAR | Status: AC
  Administered 2012-05-18: 10 mg via INTRAVENOUS
  Filled 2012-05-18: qty 2

## 2012-05-18 MED ORDER — ACETAMINOPHEN 325 MG PO TABS: 650.0000 mg | ORAL_TABLET | ORAL | Status: DC | PRN

## 2012-05-18 MED ORDER — ONDANSETRON HCL 8 MG PO TABS: 4.0000 mg | ORAL_TABLET | Freq: Three times a day (TID) | ORAL | Status: DC | PRN

## 2012-05-18 MED ORDER — DIPHENHYDRAMINE HCL 50 MG/ML IJ SOLN
25.0000 mg | Freq: Once | INTRAMUSCULAR | Status: AC
  Administered 2012-05-18: 14:00:00 via INTRAVENOUS
  Filled 2012-05-18: qty 1
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
## 2012-05-18 NOTE — ED Provider Notes (Signed)
History     CSN: [id number]  Arrival date & time 05/18/12  1155   First MD Initiated Contact with Patient 05/18/12 1216      Chief Complaint  Patient presents with  . Depression    (Consider location/radiation/quality/duration/timing/severity/associated sxs/prior treatment) HPI Comments: Patient presents to the ED voluntarily due to depression.  Patient brought in by her mother.  Patient reports that she has been depressed over the past six months.  Depression has gotten worse recently.  Mother reports that the patient sits at home in a dark room all day. Patient is unable to care for her children.  She also has been having panic attacks intermittently and has been very anxious.  She has also been having difficulty sleeping at night due to anxiety.   She is currently not on any psychiatric medications.  She does not have a Photographer.  She denies SI or HI.  She denies recreational drug use.  Denies regular alcohol use.  Patient's mother in concerned about the patient going home.    She is also complaining of a headache.  Headache has been present intermittently over the past two weeks.  She was seen in the ED for this on 05/08/12 and had a negative Head CT done at that time.  She denies any head trauma.  Denies vision changes.  Denies neck pain or stiffness.  No fever or chills.  No nausea or vomiting.  She does have some photophobia.    The history is provided by the patient.    Past Medical History  Diagnosis Date  . No pertinent past medical history     Past Surgical History  Procedure Laterality Date  . Cyst removal neck      No family history on file.  History  Substance Use Topics  . Smoking status: Current Every Day Smoker -- 0.50 packs/day for 5 years    Types: Cigarettes  . Smokeless tobacco: Not on file  . Alcohol Use: 0.0 oz/week    7-8 Glasses of wine per week    OB History   Grav Para Term Preterm Abortions TAB SAB Ect Mult Living   2 2 2       2        Review of Systems  Constitutional: Negative for fever and chills.  HENT: Negative for neck pain and neck stiffness.   Eyes: Negative for visual disturbance.  Neurological: Positive for headaches. Negative for dizziness, syncope and light-headedness.  Psychiatric/Behavioral: Positive for behavioral problems, sleep disturbance and dysphoric mood. Negative for suicidal ideas, hallucinations, confusion and self-injury. The patient is nervous/anxious.   All other systems reviewed and are negative.    Allergies  Review of patient's allergies indicates no known allergies.  Home Medications   Current Outpatient Rx  Name  Route  Sig  Dispense  Refill  . HYDROcodone-acetaminophen (NORCO/VICODIN) 5-325 MG per tablet   Oral   Take 2 tablets by mouth every 6 (six) hours as needed for pain.   10 tablet   0   . ibuprofen (ADVIL,MOTRIN) 200 MG tablet   Oral   Take 600-800 mg by mouth every 6 (six) hours as needed for pain.            BP 132/60  Pulse 94  Temp(Src) 98.1 F (36.7 C) (Oral)  Resp 18  SpO2 100%  LMP 05/03/2012  Physical Exam  Nursing note and vitals reviewed. Constitutional: She appears well-developed and well-nourished. No distress.  HENT:  Head: Normocephalic  and atraumatic.  Mouth/Throat: Oropharynx is clear and moist.  Eyes: EOM are normal. Pupils are equal, round, and reactive to light.  Neck: Normal range of motion. Neck supple.  Cardiovascular: Normal rate, regular rhythm and normal heart sounds.   Pulmonary/Chest: Effort normal and breath sounds normal.  Musculoskeletal: Normal range of motion.  Neurological: She is alert. She has normal strength. No cranial nerve deficit or sensory deficit. Gait normal.  Skin: Skin is warm and dry. No rash noted. She is not diaphoretic.  Psychiatric: Her speech is normal. She is withdrawn. Cognition and memory are normal. She exhibits a depressed mood. She expresses no homicidal and no suicidal ideation. She  expresses no suicidal plans and no homicidal plans.    ED Course  Procedures (including critical care time)  Labs Reviewed  CBC - Abnormal; Notable for the following:    WBC 14.3 (*)    RBC 5.26 (*)    Hemoglobin 15.5 (*)    All other components within normal limits  COMPREHENSIVE METABOLIC PANEL - Abnormal; Notable for the following:    GFR calc non Af Amer 84 (*)    All other components within normal limits  SALICYLATE LEVEL - Abnormal; Notable for the following:    Salicylate Lvl <2.0 (*)    All other components within normal limits  ETHANOL  ACETAMINOPHEN LEVEL  URINE RAPID DRUG SCREEN (HOSP PERFORMED)  POCT PREGNANCY, URINE   No results found.   No diagnosis found.  Discussed with the ACT team.  She recommends getting a Telepsych consult.    MDM  Patient presents today with depression.  Patient denies SI or HI.  No regular EtOH use.  No recreational drug use.  ACT team consulted and recommended Telepsych.  Telepsych consult has been ordered.  Labs unremarkable.  Telepsych pending.       Pascal Lux Wilbur, PA-C 05/18/12 1616

## 2012-05-18 NOTE — ED Notes (Signed)
Pt's mother has requested that we not give pt any meds without her there.  She left to go check on her grandson.  States she'll be back around 5pm.

## 2012-05-18 NOTE — ED Provider Notes (Signed)
Medical screening examination/treatment/procedure(s) were performed by non-physician practitioner and as supervising physician I was immediately available for consultation/collaboration.   Gavin Pound. Oletta Lamas, MD 05/18/12 2147

## 2012-05-18 NOTE — ED Notes (Signed)
Pt feels as if she is having a panic attack and cant catch her breath.  Resp 20, O2 97% on room air.  HR 70.

## 2012-05-18 NOTE — BH Assessment (Signed)
[facility] Assessment Progress Note      Consulted with Pascal Lux Wingen, PA-C in regard to pt.  Telepsych to be consulted, as pt denies SI/HI or psychosis and because pt's mother is worried about pt.  ACT to see pt if inpatient treatment recommended by telepsych.

## 2012-05-18 NOTE — ED Notes (Signed)
Pt discharged.Vital signs stable and GCS 15 
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
## 2012-05-19 ENCOUNTER — Emergency Department (HOSPITAL_COMMUNITY)
Admission: EM | Admit: 2012-05-19 | Discharge: 2012-05-19 | Disposition: A | Discharge status: Home / Self Care | Payer: Medicaid Other | Source: Home / Self Care

## 2012-05-19 ENCOUNTER — Encounter (HOSPITAL_COMMUNITY): Payer: Self-pay | Admitting: *Deleted

## 2012-05-19 DIAGNOSIS — G43909 Migraine, unspecified, not intractable, without status migrainosus: Secondary | ICD-10-CM

## 2012-05-19 MED ORDER — SUMATRIPTAN SUCCINATE 25 MG PO TABS: 25.0000 mg | ORAL_TABLET | Freq: Four times a day (QID) | ORAL | Status: DC | PRN

## 2012-05-19 MED ORDER — IBUPROFEN 600 MG PO TABS: 600.0000 mg | ORAL_TABLET | Freq: Three times a day (TID) | ORAL | Status: DC | PRN

## 2012-05-19 NOTE — ED Notes (Signed)
Patient complains of head aches, confusion, depression. Was seen at [facility] ED yesterday and was told to follow up with a primary care provider.

## 2012-05-19 NOTE — ED Notes (Signed)
Patient Demographics  Angelica [name], is a 32 y.o. female  ZOX:[id number]  [id number]  DOB - 11/19/1980  Chief Complaint  Patient presents with  . Panic Attack  . Depression        Subjective:   Angelica [name] mild depression here for headaches which are around the left frontal area, pulsatile, intermittent lasting few hours, causes mild nausea and she prefers staying in a dark room where she feels better, he also has mild depression she was checked out in the ER and was referred for outpatient treatment, she's not suicidal homicidal. Affect seems to be good on exam.  Objective:   Past Medical History  Diagnosis Date  . No pertinent past medical history       Past Surgical History  Procedure Laterality Date  . Cyst removal neck       Filed Vitals:   05/19/12 1701  BP: 118/73  Pulse: 78  Temp: 98.1 F (36.7 C)  TempSrc: Oral  Resp: 16  SpO2: 100%     Exam  Awake Alert, Oriented X 3, No new F.N deficits, Normal affect, smiling and laughing. [place].AT,PERRAL Supple Neck,No JVD, No cervical lymphadenopathy appriciated. Not suicidal homicidal Symmetrical Chest wall movement, Good air movement bilaterally, CTAB RRR,No Gallops,Rubs or new Murmurs, No Parasternal Heave +ve B.Sounds, Abd Soft, Non tender, No organomegaly appriciated, No rebound - guarding or rigidity. No Cyanosis, Clubbing or edema, No new Rash or bruise       Data Review   CBC  Recent Labs Lab 05/18/12 1215  WBC 14.3*  HGB 15.5*  HCT 44.0  PLT 207  MCV 83.7  MCH 29.5  MCHC 35.2  RDW 13.9    Chemistries    Recent Labs Lab 05/18/12 1215  NA 140  K 4.4  CL 104  CO2 26  GLUCOSE 98  BUN 13  CREATININE 0.91  CALCIUM 9.7  AST 16  ALT 19  ALKPHOS 71  BILITOT 0.5   ------------------------------------------------------------------------------------------------------------------ No results found for this basename: HGBA1C,  in the last 72  hours ------------------------------------------------------------------------------------------------------------------ No results found for this basename: CHOL, HDL, LDLCALC, TRIG, CHOLHDL, LDLDIRECT,  in the last 72 hours ------------------------------------------------------------------------------------------------------------------ No results found for this basename: TSH, T4TOTAL, FREET3, T3FREE, THYROIDAB,  in the last 72 hours ------------------------------------------------------------------------------------------------------------------ No results found for this basename: VITAMINB12, FOLATE, FERRITIN, TIBC, IRON, RETICCTPCT,  in the last 72 hours  Coagulation profile  No results found for this basename: INR, PROTIME,  in the last 168 hours     Prior to Admission medications   Medication Sig Start Date End Date Taking? Authorizing Provider  diazepam (VALIUM) 5 MG tablet Take 5 mg by mouth every 6 (six) hours as needed for anxiety.   Yes Historical Provider, MD  ibuprofen (ADVIL,MOTRIN) 600 MG tablet Take 1 tablet (600 mg total) by mouth every 8 (eight) hours as needed for pain. 05/19/12   Leroy Sea, MD  SUMAtriptan (IMITREX) 25 MG tablet Take 1 tablet (25 mg total) by mouth every 6 (six) hours as needed for migraine. 05/19/12   Leroy Sea, MD     Assessment & Plan   Migraine headaches. Advised to stop taking narcotics for the same, prescribed her with Motrin and Imitrex.   Depression. Mild. Not suicidal homicidal, outpatient psychiatry followup being set by Engineer, manufacturing Jamila.    Follow-up Information   Follow up with this clinic. Schedule an appointment as soon as possible for a visit in 1 month.      Follow  up with Psychiatrist . Schedule an appointment as soon as possible for a visit in 1 week. (you will be given details before discharge by Practice Manager Harvin Hazel)        Leroy Sea M.D on 05/19/2012 at 5:20 PM   Leroy Sea,  MD 05/19/12 [phone]

## 2012-07-31 ENCOUNTER — Emergency Department (HOSPITAL_COMMUNITY)
Admission: EM | Admit: 2012-07-31 | Discharge: 2012-08-02 | Disposition: A | Discharge status: Home / Self Care | Payer: Medicaid Other | Attending: Emergency Medicine | Admitting: Emergency Medicine

## 2012-07-31 ENCOUNTER — Encounter (HOSPITAL_COMMUNITY): Payer: Self-pay

## 2012-07-31 DIAGNOSIS — F39 Unspecified mood [affective] disorder: Secondary | ICD-10-CM | POA: Insufficient documentation

## 2012-07-31 DIAGNOSIS — F319 Bipolar disorder, unspecified: Secondary | ICD-10-CM | POA: Insufficient documentation

## 2012-07-31 DIAGNOSIS — F17200 Nicotine dependence, unspecified, uncomplicated: Secondary | ICD-10-CM | POA: Insufficient documentation

## 2012-07-31 LAB — CBC
MCHC: 33.9 g/dL (ref 30.0–36.0)
Platelets: 208 10*3/uL (ref 150–400)
RDW: 13.6 % (ref 11.5–15.5)
WBC: 13.7 10*3/uL — ABNORMAL HIGH (ref 4.0–10.5)
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
## 2012-08-01 ENCOUNTER — Encounter (HOSPITAL_COMMUNITY): Payer: Self-pay | Admitting: Registered Nurse

## 2012-08-01 DIAGNOSIS — F329 Major depressive disorder, single episode, unspecified: Secondary | ICD-10-CM

## 2012-08-01 LAB — COMPREHENSIVE METABOLIC PANEL
AST: 13 U/L (ref 0–37)
Albumin: 3.9 g/dL (ref 3.5–5.2)
Alkaline Phosphatase: 65 U/L (ref 39–117)
BUN: 11 mg/dL (ref 6–23)
Chloride: 104 mEq/L (ref 96–112)
Potassium: 3.7 mEq/L (ref 3.5–5.1)
Sodium: 139 mEq/L (ref 135–145)
Total Bilirubin: 0.3 mg/dL (ref 0.3–1.2)
Total Protein: 7.1 g/dL (ref 6.0–8.3)

## 2012-08-01 LAB — RAPID URINE DRUG SCREEN, HOSP PERFORMED
Amphetamines: NOT DETECTED
Barbiturates: NOT DETECTED
Benzodiazepines: NOT DETECTED
Cocaine: NOT DETECTED

## 2012-08-01 LAB — ETHANOL: Alcohol, Ethyl (B): <11 mg/dL (ref 0–11)

## 2012-08-01 LAB — ACETAMINOPHEN LEVEL: Acetaminophen (Tylenol), Serum: <15 ug/mL (ref 10–30)

## 2012-08-01 MED ORDER — DIVALPROEX SODIUM ER 500 MG PO TB24
500.0000 mg | ORAL_TABLET | Freq: Every day | ORAL | Status: DC
  Filled 2012-08-01 (×2): qty 1

## 2012-08-01 MED ORDER — MECLIZINE HCL 25 MG PO TABS
25.0000 mg | ORAL_TABLET | Freq: Once | ORAL | Status: AC
  Administered 2012-08-01: 25 mg via ORAL
  Filled 2012-08-01: qty 1

## 2012-08-01 MED ORDER — ACETAMINOPHEN 325 MG PO TABS
650.0000 mg | ORAL_TABLET | ORAL | Status: DC | PRN
  Administered 2012-08-02: 650 mg via ORAL
  Filled 2012-08-01: qty 2
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
## 2012-08-01 NOTE — BH Assessment (Signed)
BHH Assessment Progress Note    Pt accepted to [facility] by [facility] NP and will go when bed available.  BHH will follow up with ACT

## 2012-08-01 NOTE — ED Provider Notes (Addendum)
Patient is alert. She appears depressed. She denies active suicidal ideation or intent to hurt anyone. She has not yet been assessed by the psychiatric service. She remains under involuntary commitment.  Flint Melter, MD 08/01/12 1125  She has been seen by the psychiatric services and they plan on admitting her to the behavioral health hospital  Flint Melter, MD 08/01/12 1442

## 2012-08-01 NOTE — BHH Counselor (Signed)
Writer ordered telepsych consult by phone and fax. Aurther Loft from [facility] states Dr. Jacky Kindle is MD on call tonight.  Evette Cristal, [place] Assessment Counselor
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
## 2012-08-01 NOTE — ED Provider Notes (Signed)
Medical screening examination/treatment/procedure(s) were performed by non-physician practitioner and as supervising physician I was immediately available for consultation/collaboration.  Derwood Kaplan, MD 08/01/12 1526

## 2012-08-02 NOTE — ED Provider Notes (Signed)
Psychiatry consultation appreciated. She does not meet criteria for involuntary hospitalization and recommendation was made to refer her back to her outpatient clinic with [facility]. She is discharged with instructions to followup with [facility].  Dione Booze, MD 08/02/12 [phone]

## 2012-08-02 NOTE — BHH Counselor (Signed)
Dr. Lanell Matar telepsych consult recommends discharge and he reversed pt's involuntary commitment. EDP Preston Fleeting notified and in agreement with disposition. Per Joanie Coddington RN, pt requests to be d/c at 7 am.  Evette Cristal, [place] Assessment Counselor

## 2013-02-02 ENCOUNTER — Encounter (HOSPITAL_COMMUNITY): Payer: Self-pay | Admitting: Emergency Medicine

## 2013-02-02 ENCOUNTER — Encounter (HOSPITAL_COMMUNITY): Payer: Self-pay | Admitting: Behavioral Health

## 2013-02-02 ENCOUNTER — Emergency Department (HOSPITAL_COMMUNITY): Payer: Medicaid Other

## 2013-02-02 ENCOUNTER — Emergency Department (HOSPITAL_COMMUNITY)
Admission: EM | Admit: 2013-02-02 | Discharge: 2013-02-02 | Disposition: A | Discharge status: Other Acute Inpatient Hospital | Payer: Medicaid Other | Attending: Emergency Medicine | Admitting: Emergency Medicine

## 2013-02-02 ENCOUNTER — Inpatient Hospital Stay (HOSPITAL_COMMUNITY)
Admission: AD | Admit: 2013-02-02 | Discharge: 2013-02-09 | DRG: 885 | Disposition: A | Discharge status: Home / Self Care | Payer: Medicaid Other | Source: Intra-hospital | Attending: Psychiatry | Admitting: Psychiatry

## 2013-02-02 DIAGNOSIS — F200 Paranoid schizophrenia: Principal | ICD-10-CM | POA: Diagnosis present

## 2013-02-02 DIAGNOSIS — F17200 Nicotine dependence, unspecified, uncomplicated: Secondary | ICD-10-CM | POA: Insufficient documentation

## 2013-02-02 DIAGNOSIS — F39 Unspecified mood [affective] disorder: Secondary | ICD-10-CM | POA: Diagnosis present

## 2013-02-02 DIAGNOSIS — F29 Unspecified psychosis not due to a substance or known physiological condition: Secondary | ICD-10-CM | POA: Diagnosis present

## 2013-02-02 DIAGNOSIS — H5500 Unspecified nystagmus: Secondary | ICD-10-CM | POA: Insufficient documentation

## 2013-02-02 DIAGNOSIS — Z0289 Encounter for other administrative examinations: Secondary | ICD-10-CM | POA: Insufficient documentation

## 2013-02-02 DIAGNOSIS — Z79899 Other long term (current) drug therapy: Secondary | ICD-10-CM

## 2013-02-02 DIAGNOSIS — Z3202 Encounter for pregnancy test, result negative: Secondary | ICD-10-CM | POA: Insufficient documentation

## 2013-02-02 HISTORY — DX: Other specified health status: Z78.9

## 2013-02-02 LAB — COMPREHENSIVE METABOLIC PANEL
Alkaline Phosphatase: 65 U/L (ref 39–117)
BUN: 12 mg/dL (ref 6–23)
GFR calc Af Amer: >90 mL/min (ref >90)
Glucose, Bld: 104 mg/dL — ABNORMAL HIGH (ref 70–99)
Potassium: 4 mEq/L (ref 3.5–5.1)
Total Bilirubin: 0.4 mg/dL (ref 0.3–1.2)
Total Protein: 7.2 g/dL (ref 6.0–8.3)

## 2013-02-02 LAB — ETHANOL: Alcohol, Ethyl (B): <11 mg/dL (ref 0–11)

## 2013-02-02 LAB — RAPID URINE DRUG SCREEN, HOSP PERFORMED
Barbiturates: NOT DETECTED
Cocaine: NOT DETECTED

## 2013-02-02 LAB — CBC
HCT: 42 % (ref 36.0–46.0)
Hemoglobin: 14.2 g/dL (ref 12.0–15.0)
MCHC: 33.8 g/dL (ref 30.0–36.0)
MCV: 84.7 fL (ref 78.0–100.0)

## 2013-02-02 LAB — ACETAMINOPHEN LEVEL: Acetaminophen (Tylenol), Serum: <15 ug/mL (ref 10–30)

## 2013-02-02 MED ORDER — QUETIAPINE FUMARATE 300 MG PO TABS
300.0000 mg | ORAL_TABLET | Freq: Every day | ORAL | Status: DC
  Administered 2013-02-02: 300 mg via ORAL
  Filled 2013-02-02 (×4): qty 1

## 2013-02-02 MED ORDER — MECLIZINE HCL 25 MG PO TABS: 25.0000 mg | ORAL_TABLET | Freq: Four times a day (QID) | ORAL | Status: DC | PRN

## 2013-02-02 MED ORDER — QUETIAPINE FUMARATE 100 MG PO TABS
100.0000 mg | ORAL_TABLET | ORAL | Status: AC
  Administered 2013-02-02: 100 mg via ORAL
  Filled 2013-02-02 (×2): qty 1

## 2013-02-02 MED ORDER — PNEUMOCOCCAL VAC POLYVALENT 25 MCG/0.5ML IJ INJ
0.5000 mL | INJECTION | INTRAMUSCULAR | Status: AC
  Administered 2013-02-03: 0.5 mL via INTRAMUSCULAR

## 2013-02-02 MED ORDER — ALUM & MAG HYDROXIDE-SIMETH 200-200-20 MG/5ML PO SUSP
30.0000 mL | ORAL | Status: DC | PRN
  Administered 2013-02-08: 30 mL via ORAL

## 2013-02-02 MED ORDER — ACETAMINOPHEN 325 MG PO TABS
650.0000 mg | ORAL_TABLET | Freq: Four times a day (QID) | ORAL | Status: DC | PRN
  Administered 2013-02-02 – 2013-02-08 (×6): 650 mg via ORAL
  Filled 2013-02-02 (×7): qty 2

## 2013-02-02 MED ORDER — INFLUENZA VAC SPLIT QUAD 0.5 ML IM SUSP
0.5000 mL | INTRAMUSCULAR | Status: AC
  Administered 2013-02-03: 0.5 mL via INTRAMUSCULAR
  Filled 2013-02-02: qty 0.5

## 2013-02-02 MED ORDER — MAGNESIUM HYDROXIDE 400 MG/5ML PO SUSP: 30.0000 mL | Freq: Every day | ORAL | Status: DC | PRN

## 2013-02-02 MED ORDER — QUETIAPINE FUMARATE 100 MG PO TABS
100.0000 mg | ORAL_TABLET | ORAL | Status: DC
  Administered 2013-02-02: 100 mg via ORAL
  Filled 2013-02-02: qty 1

## 2013-02-02 MED ORDER — QUETIAPINE FUMARATE 300 MG PO TABS: 300.0000 mg | ORAL_TABLET | Freq: Every day | ORAL | Status: DC

## 2013-02-02 NOTE — Progress Notes (Signed)
CSW completed support paperwork with patient. Pt accepted to [facility] [id number]. TTS to call rn when bed available. Pt to be transported voluntarily by [employer].   Catha Gosselin, LCSW [phone]  ED CSW .02/02/2013 1442pm

## 2013-02-02 NOTE — ED Notes (Signed)
Pt w/ mobile crisis [employer], mother called them. Pt denies SI/HI, states she feels great not depressed. Pt states along worker that pt wanted to talk to psychiatrist about psychosis.

## 2013-02-02 NOTE — ED Notes (Signed)
Pt. Belongings are blue jean pants, pink and grey tennis shoes, brown sun glasses, purple shirt, white socks, head band, bra, panties.  Nurse was notified.
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
## 2013-02-02 NOTE — ED Notes (Signed)
Pt. Was given a [place] sandwich along with a cup of  Ginger ale. Nurse was notified.

## 2013-02-02 NOTE — Tx Team (Signed)
Initial Interdisciplinary Treatment Plan  PATIENT STRENGTHS: (choose at least two) Ability for insight Average or above average intelligence Communication skills Motivation for treatment/growth Physical Health Supportive family/friends  PATIENT STRESSORS: Loss of estrainged from entire family due to mental illness Marital or family conflict Medication change or noncompliance   PROBLEM LIST: Problem List/Patient Goals Date to be addressed Date deferred Reason deferred Estimated date of resolution  Psychosis 02/02/2013   02/08/2013  Depression 02/02/2013   02/08/2013  Family conflict 02/02/2013   02/08/2013                                       DISCHARGE CRITERIA:  Ability to meet basic life and health needs Improved stabilization in mood, thinking, and/or behavior Motivation to continue treatment in a less acute level of care Need for constant or close observation no longer present Safe-care adequate arrangements made Verbal commitment to aftercare and medication compliance  PRELIMINARY DISCHARGE PLAN: Outpatient therapy Participate in family therapy Return to previous living arrangement Return to previous work or school arrangements  PATIENT/FAMIILY INVOLVEMENT: This treatment plan has been presented to and reviewed with the patient, Angelica [name], and/or family member.  The patient and family have been given the opportunity to ask questions and make suggestions.  Angelica [name], Angelica [name] 02/02/2013, 6:53 PM

## 2013-02-02 NOTE — Progress Notes (Signed)
Patient ID: Angelica [name], female   DOB: 02-21-1980, 32 y.o.   MRN: [id number] This is a 32 year old female admitted for tx of depression and psychosis. Pt has increased conflict within the family and feels as though she is lost to them. Pt believes that her mother and GM wanted to "make her sick to get her in here." Pt is somewhat ambivalent. She does not believe she is bipolar, but agrees that she needs some kind of help. Pt mood is depressed and her affect is flat, but she is pleasant and cooperative with staff and redirectable. Pt did request a 72 hour request for d/c. Writer informed RN providing care. Pt denies SI/HI and AVH and contracts for safety as well. Writer oriented pt to the unit and 15 minute checks initiated for safety.

## 2013-02-02 NOTE — ED Notes (Signed)
[facility] has asked for [place] to wait .
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
## 2013-02-02 NOTE — ED Notes (Signed)
Pt transported to [facility] by [facility].  All belongings sent w/ Pt.

## 2013-02-02 NOTE — Progress Notes (Signed)
The focus of this group is to help patients review their daily goal of treatment and discuss progress on daily workbooks. Pt attended the evening group session and responded to all discussion prompts from the Writer. Pt shared that today was a good day on account of being at the hospital voluntarily. "I brought myself here and I consider this the first step to making myself better." Pt shared that her goal was to be happy and spend time with her children. Pt's affect was appropriate.
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
## 2013-02-03 DIAGNOSIS — F200 Paranoid schizophrenia: Secondary | ICD-10-CM | POA: Diagnosis present

## 2013-02-03 LAB — POCT PREGNANCY, URINE: Preg Test, Ur: NEGATIVE

## 2013-02-03 MED ORDER — HALOPERIDOL 2 MG PO TABS
2.0000 mg | ORAL_TABLET | Freq: Two times a day (BID) | ORAL | Status: DC
Start: 2013-02-03 — End: 2013-02-04
  Administered 2013-02-03 – 2013-02-04 (×2): 2 mg via ORAL
  Filled 2013-02-03 (×4): qty 1

## 2013-02-03 MED ORDER — IBUPROFEN 600 MG PO TABS
ORAL_TABLET | ORAL | Status: AC
  Filled 2013-02-03: qty 1

## 2013-02-03 MED ORDER — TRAZODONE HCL 50 MG PO TABS
50.0000 mg | ORAL_TABLET | Freq: Every day | ORAL | Status: DC
  Administered 2013-02-03 – 2013-02-06 (×3): 50 mg via ORAL
  Filled 2013-02-03 (×8): qty 1

## 2013-02-03 MED ORDER — BENZTROPINE MESYLATE 0.5 MG PO TABS
0.5000 mg | ORAL_TABLET | Freq: Two times a day (BID) | ORAL | Status: DC
  Administered 2013-02-03 – 2013-02-04 (×2): 0.5 mg via ORAL
  Filled 2013-02-03 (×4): qty 1

## 2013-02-03 MED ORDER — IBUPROFEN 200 MG PO TABS
600.0000 mg | ORAL_TABLET | Freq: Four times a day (QID) | ORAL | Status: DC | PRN
  Administered 2013-02-03 – 2013-02-07 (×7): 600 mg via ORAL
  Filled 2013-02-03 (×7): qty 3

## 2013-02-03 MED ORDER — OLANZAPINE 10 MG PO TBDP
10.0000 mg | ORAL_TABLET | Freq: Three times a day (TID) | ORAL | Status: DC | PRN
  Filled 2013-02-03: qty 1

## 2013-02-03 NOTE — Progress Notes (Addendum)
D-pt is paranoid and seems depressed, sts she slept well, denies hopelessness, feels slightly depressed, she sts she will cont. Taking her meds when she leaves here A-pt attended group and took her medications R-cont. To monitor pt for safety

## 2013-02-03 NOTE — [facility] Suicide Risk Assessment (Signed)
[facility] INPATIENT: Family/Significant Other Suicide Prevention Education  Suicide Prevention Education:  Education Completed; No one has been identified by the patient as the family member/significant other with whom the patient will be residing, and identified as the person(s) who will aid the patient in the event of a mental health crisis (suicidal ideations/suicide attempt).   Pt did not c/o SI at admission, nor have they endorsed SI during their stay here. SPE not required. SPI pamphlet provided to pt and she was encouraged to share with her support network. Pt refused to consent to family contact for collateral information.  [employer], LCSWA 02/03/2013 2:43 PM
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
## 2013-02-03 NOTE — [facility] Group Notes (Signed)
[facility] Group Notes:  (Counselor/Nursing/MHT/Case Management/Adjunct)  02/03/2013 1:15PM  Type of Therapy:  Group Therapy  Participation Level:  Invited, refused to attend    Summary of Progress/Problems: The topic for group was balance in life.  Pt participated in the discussion about when their life was in balance and out of balance and how this feels.  Pt discussed ways to get back in balance and short term goals they can work on to get where they want to be.    Angelica [name] B 02/03/2013 2:26 PM
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
## 2013-02-03 NOTE — Progress Notes (Signed)
D: Patient denies SI/HI/AVH.  She has a depressed mood and flat affect.  Pt. Has been up and doing some interacting.  She did attend group tonight for karaoke and stated she had a good time.  Pt. Reports that her appetite and sleep are good.    A: Patient given emotional support from RN. Patient encouraged to come to staff with concerns and/or questions. Patient's medication routine continued. Patient's orders and plan of care reviewed.   R: Patient remains appropriate and cooperative. Will continue to monitor patient q15 minutes for safety.

## 2013-02-03 NOTE — Progress Notes (Signed)
Adult Psychoeducational Group Note  Date:  02/03/2013 Time:  9:55 PM  Group Topic/Focus:  Karaoke  Participation Level:  Active  Participation Quality:  Appropriate  Affect:  Appropriate  Cognitive:  Alert  Insight: Appropriate  Engagement in Group:  Engaged  Modes of Intervention:  Socialization  Additional CommentsFlonnie Hailstone 02/03/2013, 9:55 PM

## 2013-02-04 MED ORDER — CARBAMAZEPINE ER 200 MG PO TB12
200.0000 mg | ORAL_TABLET | Freq: Two times a day (BID) | ORAL | Status: DC
  Administered 2013-02-04 – 2013-02-08 (×4): 200 mg via ORAL
  Filled 2013-02-04 (×14): qty 1

## 2013-02-04 MED ORDER — BENZTROPINE MESYLATE 1 MG PO TABS
1.0000 mg | ORAL_TABLET | Freq: Two times a day (BID) | ORAL | Status: DC
  Administered 2013-02-04 – 2013-02-08 (×4): 1 mg via ORAL
  Filled 2013-02-04 (×14): qty 1

## 2013-02-04 MED ORDER — HALOPERIDOL 5 MG PO TABS
5.0000 mg | ORAL_TABLET | Freq: Two times a day (BID) | ORAL | Status: DC
  Administered 2013-02-04 – 2013-02-08 (×4): 5 mg via ORAL
  Filled 2013-02-04 (×14): qty 1

## 2013-02-04 NOTE — [facility] Group Notes (Signed)
[facility] LCSW Group Therapy  02/04/2013  1:05 PM  Type of Therapy:  Group therapy  Participation Level:  Active  Participation Quality:  Attentive  Affect:  Flat  Cognitive:  Oriented  Insight:  Limited  Engagement in Therapy:  Limited  Modes of Intervention:  Discussion, Socialization  Summary of Progress/Problems:  Chaplain was here to lead a group on themes of hope and courage. "Hope is having goals.  One of my goals is to be a better mom by protecting my children."  This in the context of her paranoia that is making her think that her family is putting cocaine in her children's food and drink.  She was engaged throughout in terms of talking about family relationships and the need for trusting relationships. Daryel Gerald B 02/04/2013 1:23 PM

## 2013-02-04 NOTE — Progress Notes (Signed)
Recreation Therapy Notes  Date: 12.19.2014 Time: 9:30am Location: [address] Dayroom   Group Topic: Self-Expression  Goal Area(s) Addresses:  Patient will effectively express emotions through use of art.   Behavioral Response: Appropriate  Intervention: Art, Music  Activity: Two faces of me. Patients were given a worksheet with two faces facing each other. On the right side patients were asked to draw or identify how they feel about themselves in this moment. On to left side patients were asked to draw or identify how they think they will feel at d/c.   Education: Discahrge Planning, Emotional Recognition.    Education Outcome: Acknowledges understanding.   Clinical Observations/Feedback: Patient participated in group session, but was unable to do so within parameters of activity. Patient worksheet was covered in statements such as "go back to school" and "i need a new life."  Patient explained this as all the thoughts going through her head.   Marykay Lex Anden Bartolo, LRT/CTRS  Jearl Klinefelter 02/04/2013 12:15 PM
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
## 2013-02-04 NOTE — Progress Notes (Signed)
[facility] MD Progress Note  02/04/2013 11:19 AM Angelica [name]  MRN:  [id number] Subjective: "I am very sure that my family are after me, my mother is feeding my 59 and 31 year old children cocaine." Objective: Patient who is very paranoid, delusional, easily agitated, irritable with bizarre behavior and disorganized thought process. Patient is religiously preoccupied and has a fixed paranoia ideation that her family were going poison her. Patient is showing poor judgment and poor insight into her mental illness.  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   HENT: Negative.   Eyes: Negative.   Respiratory: Negative.   Cardiovascular: Negative.   Gastrointestinal: Negative.   Genitourinary: Negative.   Musculoskeletal: Negative.   Skin: Negative.   Neurological: Negative.   Endo/Heme/Allergies: Negative.   Psychiatric/Behavioral: Positive for hallucinations. The patient is nervous/anxious and has insomnia.     Blood pressure 104/70, pulse 93, temperature 98.4 F (36.9 C), temperature source Oral, resp. rate 18, height 5\' 3"  (1.6 m), weight 86.183 kg (190 lb), last menstrual period 01/03/2013.Body mass index is 33.67 kg/(m^2).  General Appearance: Fairly Groomed  Patent attorney::  Fair  Speech:  Pressured  Volume:  Increased  Mood:  Angry and Irritable  Affect:  Labile  Thought Process:  Disorganized  Orientation:  Full (Time, Place, and Person)  Thought Content:  Delusions and Paranoid Ideation  Suicidal  Thoughts:  No  Homicidal Thoughts:  No  Memory:  Immediate;   Fair Recent;   Fair Remote;   Fair  Judgement:  Poor  Insight:  Lacking  Psychomotor Activity:  Increased  Concentration:  Fair  Recall:  Fair  Akathisia:  No  Handed:  Right  AIMS (if indicated):     Assets:  Physical Health Social Support  Sleep:  Number of Hours: 6.25   Current Medications: Current Facility-Administered Medications  Medication Dose Route Frequency Provider Last Rate Last Dose  . acetaminophen (TYLENOL) tablet 650 mg  650 mg Oral Q6H PRN Shuvon Rankin, NP   650 mg at 02/04/13 0823  . alum & mag hydroxide-simeth (MAALOX/MYLANTA) 200-200-20 MG/5ML suspension 30 mL  30 mL Oral Q4H PRN Shuvon Rankin, NP      . benztropine (COGENTIN) tablet 1 mg  1 mg Oral BID Kashonda Sarkisyan      . carbamazepine (TEGRETOL XR) 12 hr tablet 200 mg  200 mg Oral BID Archie Shea      . haloperidol (HALDOL) tablet 5 mg  5 mg Oral BID Kurt Azimi      . ibuprofen (ADVIL,MOTRIN) tablet 600 mg  600 mg Oral Q6H PRN Fransisca Kaufmann, NP   600 mg at 02/03/13 1108  . magnesium hydroxide (MILK OF MAGNESIA) suspension 30 mL  30 mL Oral Daily PRN Shuvon Rankin, NP      . OLANZapine zydis (ZYPREXA) disintegrating tablet 10 mg  10 mg Oral Q8H PRN Brant Peets      . traZODone (DESYREL) tablet 50 mg  50 mg Oral QHS Fujie Dickison   50 mg at 02/03/13 2157    Lab Results:  Results for orders placed during the hospital  encounter of 02/02/13 (from the past 48 hour(s))  ACETAMINOPHEN LEVEL     Status: None   Collection Time    02/02/13 12:25 PM      Result Value Range   Acetaminophen (Tylenol), Serum <15.0  10 - 30 ug/mL   Comment:            THERAPEUTIC CONCENTRATIONS VARY     SIGNIFICANTLY. A RANGE OF 10-30     ug/mL MAY BE AN EFFECTIVE     CONCENTRATION FOR MANY PATIENTS.     HOWEVER, SOME ARE BEST TREATED     AT CONCENTRATIONS OUTSIDE THIS     RANGE.     ACETAMINOPHEN CONCENTRATIONS     >150 ug/mL AT 4 HOURS AFTER      INGESTION AND >50 ug/mL AT 12     HOURS AFTER INGESTION ARE     OFTEN ASSOCIATED WITH TOXIC     REACTIONS.  CBC     Status: Abnormal   Collection Time    02/02/13 12:25 PM      Result Value Range   WBC 10.7 (*) 4.0 - 10.5 K/uL   RBC 4.96  3.87 - 5.11 MIL/uL   Hemoglobin 14.2  12.0 - 15.0 g/dL   HCT 16.1  09.6 - 04.5 %   MCV 84.7  78.0 - 100.0 fL   MCH 28.6  26.0 - 34.0 pg   MCHC 33.8  30.0 - 36.0 g/dL   RDW 40.9  81.1 - 91.4 %   Platelets 187  150 - 400 K/uL  COMPREHENSIVE METABOLIC PANEL     Status: Abnormal   Collection Time    02/02/13 12:25 PM      Result Value Range   Sodium 136  135 - 145 mEq/L   Potassium 4.0  3.5 - 5.1 mEq/L   Chloride 102  96 - 112 mEq/L   CO2 26  19 - 32 mEq/L   Glucose, Bld 104 (*) 70 - 99 mg/dL   BUN 12  6 - 23 mg/dL   Creatinine, Ser 7.82  0.50 - 1.10 mg/dL   Calcium 9.8  8.4 - 95.6 mg/dL   Total Protein 7.2  6.0 - 8.3 g/dL   Albumin 4.0  3.5 - 5.2 g/dL   AST 11  0 - 37 U/L   ALT 12  0 - 35 U/L   Alkaline Phosphatase 65  39 - 117 U/L   Total Bilirubin 0.4  0.3 - 1.2 mg/dL   GFR calc non Af Amer >90  >90 mL/min   GFR calc Af Amer >90  >90 mL/min   Comment: (NOTE)     The eGFR has been calculated using the CKD EPI equation.     This calculation has not been validated in all clinical situations.     eGFR's persistently <90 mL/min signify possible Chronic Kidney     Disease.  ETHANOL     Status: None   Collection Time    02/02/13 12:25 PM      Result Value Range   Alcohol, Ethyl (B) <11  0 - 11 mg/dL   Comment:            LOWEST DETECTABLE LIMIT FOR     SERUM ALCOHOL IS 11 mg/dL     FOR MEDICAL PURPOSES ONLY  SALICYLATE LEVEL     Status: Abnormal   Collection Time    02/02/13 12:25 PM      Result Value Range   Salicylate Lvl <2.0 (*)  2.8 - 20.0 mg/dL  URINE RAPID DRUG SCREEN (HOSP PERFORMED)     Status: None   Collection Time    02/02/13 12:27 PM      Result Value Range   Opiates NONE DETECTED  NONE DETECTED   Cocaine NONE  DETECTED  NONE DETECTED   Benzodiazepines NONE DETECTED  NONE DETECTED   Amphetamines NONE DETECTED  NONE DETECTED   Tetrahydrocannabinol NONE DETECTED  NONE DETECTED   Barbiturates NONE DETECTED  NONE DETECTED   Comment:            DRUG SCREEN FOR MEDICAL PURPOSES     ONLY.  IF CONFIRMATION IS NEEDED     FOR ANY PURPOSE, NOTIFY LAB     WITHIN 5 DAYS.                LOWEST DETECTABLE LIMITS     FOR URINE DRUG SCREEN     Drug Class       Cutoff (ng/mL)     Amphetamine      1000     Barbiturate      200     Benzodiazepine   200     Tricyclics       300     Opiates          300     Cocaine          300     THC              50  POCT PREGNANCY, URINE     Status: None   Collection Time    02/02/13 12:32 PM      Result Value Range   Preg Test, Ur NEGATIVE  NEGATIVE   Comment:            THE SENSITIVITY OF THIS     METHODOLOGY IS >24 mIU/mL    Physical Findings: AIMS: Facial and Oral Movements Muscles of Facial Expression: None, normal Lips and Perioral Area: None, normal Jaw: None, normal Tongue: None, normal,Extremity Movements Upper (arms, wrists, hands, fingers): None, normal Lower (legs, knees, ankles, toes): None, normal, Trunk Movements Neck, shoulders, hips: None, normal, Overall Severity Severity of abnormal movements (highest score from questions above): None, normal Incapacitation due to abnormal movements: None, normal Patient's awareness of abnormal movements (rate only patient's report): No Awareness, Dental Status Current problems with teeth and/or dentures?: Yes (left lower jaw cavity) Does patient usually wear dentures?: No  CIWA:    COWS:     Treatment Plan Summary: Daily contact with patient to assess and evaluate symptoms and progress in treatment Medication management  Plan:1. Admit for crisis management and stabilization. 2. Medication management to reduce current symptoms to base line and improve the     patient's overall level of functioning 3.  Treat health problems as indicated. 4. Develop treatment plan to decrease risk of relapse upon discharge and the need for     readmission. 5. Psycho-social education regarding relapse prevention and self care. 6. Health care follow up as needed for medical problems. 7. Restart home medications where appropriate. 8. Increase Haldol to 5mg  po BID for delusions 9. Initiate Tegretol XR 200mg  po bid for agitation.  Medical Decision Making Problem Points:  Established problem, worsening (2), Review of last therapy session (1) and Review of psycho-social stressors (1) Data Points:  Order Aims Assessment (2) Review of medication regiment & side effects (2) Review of new medications or change in dosage (2)  I certify  that inpatient services furnished can reasonably be expected to improve the patient's condition.   Thedore Mins, MD 02/04/2013, 11:19 AM

## 2013-02-04 NOTE — Progress Notes (Signed)
D-Patient is out in milieu with minimal interaction.  She rates her depression at 2 and hopelessness at "0-any".  Reports fair sleep and good appetite.  A-Support and encouragement offered.  Continue current POC and evaluation of treatment goals.  Continue 15' checks for safety.  R- Safety maintained.

## 2013-02-04 NOTE — Progress Notes (Signed)
Patient observed interacting among peers in the dayroom. Denies SI, HI, AVH. Contracts for safety. Patient states "I'm doing fine".  Encouragement offered. Given tylenol for headache. Patient reports headaches on going since 12/17. Patient is unsure of possible source of pain. Given medications as ordered.  Patient safety maintained, q15 checks continue.

## 2013-02-05 DIAGNOSIS — F200 Paranoid schizophrenia: Secondary | ICD-10-CM

## 2013-02-05 MED ORDER — MAGNESIUM CITRATE PO SOLN
1.0000 | Freq: Once | ORAL | Status: AC
  Administered 2013-02-05: 1 via ORAL

## 2013-02-05 NOTE — Progress Notes (Addendum)
Patient ID: Angelica [name], female   DOB: [birth year], 32 y.o.   MRN: [id number] D. Patient presents with labile mood, suspicious , with paranoid disorganized though process.She continues to be very gamey and difficult with medication administration. Patient states '' Oh I don't really feel like getting into why I'm here. Let's just say it was bad vibes and bad karma, people were putting out on me. '' Patient in am noted to cheek medications and refused to swallow or spit out at medication window, running back to room to spit out medications. She remains resistant to care and paranoid stating '' I didn't cheek those medications,well what was it that you gave me, Im not bipolar I'm not supposed to be taking that. Let me see. I'm supposed to take depakote and I need something to help me go to the bathroom '' Pt offered magnesium citrate and then refused stating '' I don't want this it tastes funny? Did you put something in it ? I want another ''  A. Patient educated on medications. Support and encouragement provided. Discussed above information with L. Davis NP, (pt cheeking medications and persistent paranoid thinking.) R. Patient remains in milieu, interacting with peers. She denies any further acute concerns at this time. Will continue to monitor q 15 minutes for safety.

## 2013-02-05 NOTE — Progress Notes (Signed)
Patient ID: Angelica [name], female   DOB: December 26, 1980, 32 y.o.   MRN: [id number] Psychoeducational Group Note  Date:  02/05/2013 Time:0930am  Group Topic/Focus:  Identifying Needs:   The focus of this group is to help patients identify their personal needs that have been historically problematic and identify healthy behaviors to address their needs.  Participation Level:  Active  Participation Quality:  Appropriate  Affect:  Appropriate  Cognitive:  Delusional  Insight:  Limited  Engagement in Group:  Limited  Additional Comments:  Inventory and Psychoeducational group    [name] 02/05/2013,10:05 AM

## 2013-02-05 NOTE — Progress Notes (Signed)
[facility] Group Notes:  (Nursing/MHT/Case Management/Adjunct)  Date:  02/05/2013  Time:  8:00 p.m.   Type of Therapy:  Psychoeducational Skills  Participation Level:  Minimal  Participation Quality:  Attentive  Affect:  Depressed  Cognitive:  Disorganized  Insight:  Lacking  Engagement in Group:  Lacking  Modes of Intervention:  Education  Summary of Progress/Problems: The patient described her day as having been "okay". The patient gave credit to God (her "creator") for her good day and also mentioned that she felt good about spending time with her peers. Finally, the patient verbalized that she went to the gym where she was able to let some of the stress out by playing basketball. In terms of the theme of the day, her coping skill is to meditate.   Hazle Coca S 02/05/2013, 9:07 PM

## 2013-02-05 NOTE — [facility] Group Notes (Signed)
[facility] Group Notes:  (Clinical Social Work)  02/05/2013  11:00-11:45AM  Summary of Progress/Problems:   The main focus of today's process group was for the patient to identify ways in which they have in the past sabotaged their own recovery and reasons they may have done this/what they received from doing it.  We then worked to identify a specific plan to avoid doing this when discharged from the hospital for this admission.  The patient expressed that she does not like taking her psychatric medications because of the side effects.    Type of Therapy:  Group Therapy - Process  Participation Level:  Active  Participation Quality:  Attentive and Resistant  Affect:  Blunted  Cognitive:  Alert  Insight:  Limited  Engagement in Therapy:  Improving  Modes of Intervention:  Clarification, Education, Exploration, Discussion  Ambrose Mantle, LCSW 02/05/2013, 1:16 PM

## 2013-02-05 NOTE — Progress Notes (Signed)
Patient ID: Angelica [name], female   DOB: 01-May-1980, 32 y.o.   MRN: [id number] [facility] MD Progress Note  02/05/2013 2:49 PM CAROLE DONER  MRN:  [id number] Subjective:  Patient states "I came in here voluntarily so I should be able to leave. I don't know what medicines you all are giving me. I don't trust this place. Let me see some medication handouts. Yes I did spit out my medications this morning because I did not know what they were. I want a copy of my rights."   Objective: Patient who is very paranoid, delusional, easily agitated, irritable with bizarre behavior and disorganized thought process. Patient is religiously preoccupied and has a fixed paranoia ideation that her family were going poison her. Patient is showing poor judgment and poor insight into her mental illness. Nursing staff report that patient has been acting very paranoid. She declined the magnesium citrate she requested because "It does not taste right." This Clinical research associate spoke with patient in presence of AC about rescinding her 72 hour request for discharge rather than be made IVC due to her psychotic symptoms. The patient continued to express displeasure with taking medications stating "I don't need anything except to leave" but did rescind 72 hr.   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   HENT: Negative.   Eyes: Negative.   Respiratory: Negative.   Cardiovascular: Negative.   Gastrointestinal: Negative.   Genitourinary: Negative.    Musculoskeletal: Negative.   Skin: Negative.   Neurological: Negative.   Endo/Heme/Allergies: Negative.   Psychiatric/Behavioral: Positive for hallucinations. The patient is nervous/anxious and has insomnia.     Blood pressure 106/69, pulse 89, temperature 98.1 F (36.7 C), temperature source Oral, resp. rate 18, height 5\' 3"  (1.6 m), weight 86.183 kg (190 lb), last menstrual period 01/03/2013.Body mass index is 33.67 kg/(m^2).  General Appearance: Fairly Groomed  Patent attorney::  Fair  Speech:  Pressured  Volume:  Increased  Mood:  Angry and Irritable  Affect:  Labile  Thought Process:  Disorganized  Orientation:  Full (Time, Place, and Person)  Thought Content:  Delusions and Paranoid Ideation  Suicidal Thoughts:  No  Homicidal Thoughts:  No  Memory:  Immediate;   Fair Recent;   Fair Remote;   Fair  Judgement:  Poor  Insight:  Lacking  Psychomotor Activity:  Increased  Concentration:  Fair  Recall:  Fair  Akathisia:  No  Handed:  Right  AIMS (if indicated):     Assets:  Physical Health Social Support  Sleep:  Number of Hours: 4.75   Current Medications: Current Facility-Administered Medications  Medication Dose Route Frequency Provider Last Rate Last Dose  . acetaminophen (TYLENOL) tablet 650 mg  650 mg Oral Q6H PRN Shuvon Rankin, NP   650 mg at 02/04/13 1949  . alum & mag hydroxide-simeth (MAALOX/MYLANTA) 200-200-20 MG/5ML suspension 30 mL  30 mL Oral Q4H PRN Shuvon Rankin, NP      . benztropine (COGENTIN) tablet 1 mg  1 mg Oral BID Mojeed Akintayo   1 mg at 02/04/13 1700  . carbamazepine (TEGRETOL XR) 12 hr tablet  200 mg  200 mg Oral BID Mojeed Akintayo   200 mg at 02/04/13 1700  . haloperidol (HALDOL) tablet 5 mg  5 mg Oral BID Mojeed Akintayo   5 mg at 02/04/13 1700  . ibuprofen (ADVIL,MOTRIN) tablet 600 mg  600 mg Oral Q6H PRN Fransisca Kaufmann, NP   600 mg at 02/05/13 1610  . magnesium hydroxide (MILK OF MAGNESIA) suspension 30 mL  30 mL Oral Daily PRN Shuvon Rankin, NP       . OLANZapine zydis (ZYPREXA) disintegrating tablet 10 mg  10 mg Oral Q8H PRN Mojeed Akintayo      . traZODone (DESYREL) tablet 50 mg  50 mg Oral QHS Mojeed Akintayo   50 mg at 02/04/13 2139    Lab Results:  No results found for this or any previous visit (from the past 48 hour(s)).  Physical Findings: AIMS: Facial and Oral Movements Muscles of Facial Expression: None, normal Lips and Perioral Area: None, normal Jaw: None, normal Tongue: None, normal,Extremity Movements Upper (arms, wrists, hands, fingers): None, normal Lower (legs, knees, ankles, toes): None, normal, Trunk Movements Neck, shoulders, hips: None, normal, Overall Severity Severity of abnormal movements (highest score from questions above): None, normal Incapacitation due to abnormal movements: None, normal Patient's awareness of abnormal movements (rate only patient's report): No Awareness, Dental Status Current problems with teeth and/or dentures?: Yes (left lower jaw cavity) Does patient usually wear dentures?: No  CIWA:    COWS:     Treatment Plan Summary: Daily contact with patient to assess and evaluate symptoms and progress in treatment Medication management  Plan:1. Continue crisis management and stabilization. 2. Medication management to reduce current symptoms to base line and improve the  patient's overall level of functioning 3. Treat health problems as indicated. 4. Develop treatment plan to decrease risk of relapse upon discharge and the need for readmission. 5. Psycho-social education regarding relapse prevention and self care. 6. Health care follow up as needed for medical problems. 7. Restart home medications where appropriate. 8. Continue Haldol to 5mg  po BID for delusions 9. Continue Tegretol XR 200mg  po bid for agitation. 10. Will provide patient with medication handouts. Obtain second opinion for forced medications if the patient continues to refuse her medications.   Medical Decision  Making Problem Points:  Established problem, worsening (2), Review of last therapy session (1) and Review of psycho-social stressors (1) Data Points:  Order Aims Assessment (2) Review of medication regiment & side effects (2) Review of new medications or change in dosage (2)  I certify that inpatient services furnished can reasonably be expected to improve the patient's condition.   Fransisca Kaufmann, NP-C 02/05/2013, 2:48 PM  Reviewed the information documented and agree with the treatment plan.  Bristyl Mclees,JANARDHAHA R. 02/05/2013 3:04 PM

## 2013-02-06 NOTE — Progress Notes (Signed)
Patient ID: Angelica [name], female   DOB: 1980/07/25, 32 y.o.   MRN: [id number] [facility] MD Progress Note  02/06/2013 4:12 PM Angelica [name]  MRN:  [id number] Subjective:  Patient states "I'm not taking any medications. I read the printouts for haldol and I don't need any of that. I don't have Tourette's. I need to get a lawyer because of all this."   Objective: Patient remains very delusional, paranoid, confrontation, and noncompliant with treatment. She was seen by Dr. Elsie Saas who agrees that the patient would benefit from taking psychiatric medication. The patient has no insight into her mental illness and nursing staff report she is becoming increasingly paranoid and suspicious of staff. Staff report that patient poured out her water and refused tylenol due to her paranoia about being poisoned.   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   HENT: Negative.   Eyes: Negative.   Respiratory: Negative.   Cardiovascular: Negative.   Gastrointestinal: Negative.   Genitourinary: Negative.   Musculoskeletal: Negative.   Skin: Negative.   Neurological: Negative.   Endo/Heme/Allergies: Negative.   Psychiatric/Behavioral: Positive for hallucinations. The patient is nervous/anxious and has insomnia.     Blood pressure 106/63, pulse 85, temperature 98.6 F (37 C), temperature source Oral, resp. rate 18, height 5\' 3"  (1.6 m), weight 86.183 kg (190 lb), last menstrual period 01/03/2013.Body mass index is 33.67  kg/(m^2).  General Appearance: Fairly Groomed  Patent attorney::  Fair  Speech:  Pressured  Volume:  Increased  Mood:  Angry and Irritable  Affect:  Labile  Thought Process:  Disorganized  Orientation:  Full (Time, Place, and Person)  Thought Content:  Delusions and Paranoid Ideation  Suicidal Thoughts:  No  Homicidal Thoughts:  No  Memory:  Immediate;   Fair Recent;   Fair Remote;   Fair  Judgement:  Poor  Insight:  Lacking  Psychomotor Activity:  Increased  Concentration:  Fair  Recall:  Fair  Akathisia:  No  Handed:  Right  AIMS (if indicated):     Assets:  Physical Health Social Support  Sleep:  Number of Hours: 5.75   Current Medications: Current Facility-Administered Medications  Medication Dose Route Frequency Provider Last Rate Last Dose  . acetaminophen (TYLENOL) tablet 650 mg  650 mg Oral Q6H PRN Shuvon Rankin, NP   650 mg at 02/04/13 1949  . alum & mag hydroxide-simeth (MAALOX/MYLANTA) 200-200-20 MG/5ML suspension 30 mL  30 mL Oral Q4H PRN Shuvon Rankin, NP      . benztropine (COGENTIN) tablet 1 mg  1 mg Oral BID Mojeed Akintayo   1 mg at 02/04/13 1700  . carbamazepine (TEGRETOL XR) 12 hr tablet 200 mg  200 mg Oral BID Mojeed Akintayo   200 mg at 02/04/13 1700  . haloperidol (HALDOL) tablet 5 mg  5 mg Oral BID Mojeed Akintayo   5 mg at 02/04/13 1700  . ibuprofen (ADVIL,MOTRIN) tablet 600 mg  600 mg Oral Q6H PRN Fransisca Kaufmann, NP   600 mg at 02/06/13 1537  . magnesium hydroxide (MILK OF MAGNESIA) suspension 30 mL  30 mL Oral Daily  PRN Shuvon Rankin, NP      . OLANZapine zydis (ZYPREXA) disintegrating tablet 10 mg  10 mg Oral Q8H PRN Mojeed Akintayo      . traZODone (DESYREL) tablet 50 mg  50 mg Oral QHS Mojeed Akintayo   50 mg at 02/04/13 2139    Lab Results:  No results found for this or any previous visit (from the past 48 hour(s)).  Physical Findings: AIMS: Facial and Oral Movements Muscles of Facial Expression: None, normal Lips and Perioral Area: None,  normal Jaw: None, normal Tongue: None, normal,Extremity Movements Upper (arms, wrists, hands, fingers): None, normal Lower (legs, knees, ankles, toes): None, normal, Trunk Movements Neck, shoulders, hips: None, normal, Overall Severity Severity of abnormal movements (highest score from questions above): None, normal Incapacitation due to abnormal movements: None, normal Patient's awareness of abnormal movements (rate only patient's report): No Awareness, Dental Status Current problems with teeth and/or dentures?: Yes (left lower jaw cavity) Does patient usually wear dentures?: No  CIWA:    COWS:     Treatment Plan Summary: Daily contact with patient to assess and evaluate symptoms and progress in treatment Medication management  Plan:1. Continue crisis management and stabilization. 2. Medication management to reduce current symptoms to base line and improve the patient's overall level of functioning. Continue to encourage patient to be compliant with medications that are prescribed for her.  3. Treat health problems as indicated. 4. Develop treatment plan to decrease risk of relapse upon discharge and the need for readmission. 5. Psycho-social education regarding relapse prevention and self care. 6. Health care follow up as needed for medical problems. 7. Restart home medications where appropriate. 8. Continue Haldol to 5mg  po BID for delusions 9. Continue Tegretol XR 200mg  po bid for agitation. 10. Second opinion for forced medication completed today with note from Dr. Elsie Saas in epic.   Medical Decision Making Problem Points:  Established problem, worsening (2), Review of last therapy session (1) and Review of psycho-social stressors (1) Data Points:  Order Aims Assessment (2) Review of medication regiment & side effects (2) Review of new medications or change in dosage (2)  I certify that inpatient services furnished can reasonably be expected to improve the patient's  condition.   Fransisca Kaufmann, NP-C 02/06/2013, 4:12 PM

## 2013-02-06 NOTE — Progress Notes (Signed)
[facility] Group Notes:  (Nursing/MHT/Case Management/Adjunct)  Date:  02/06/2013  Time:  8:00 pm.   Type of Therapy:  Psychoeducational Skills  Participation Level:  Active  Participation Quality:  Appropriate  Affect:  Excited  Cognitive:  Disorganized  Insight:  Lacking  Engagement in Group:  Improving  Modes of Intervention:  Education  Summary of Progress/Problems: The patient was more conversational and involved with the group as compared to yesterday. Her affect brightened considerably when she spoke of her new friendship with her roommate. She described her roommate as her "b.f.f." and that her roommate will be part of her support system (theme of the day).   Hazle Coca S 02/06/2013, 9:29 PM

## 2013-02-06 NOTE — Progress Notes (Signed)
Patient ID: Angelica [name], female   DOB: 16-May-1980, 32 y.o.   MRN: [id number]  Patient has been suffering with the Paranoid schizophrenia in the noncompliant with medications. Agree with forced medication management and patient becomes unmanageable with the danger to self and others without treatment.  Angelica [name],JANARDHAHA R. 02/06/2013 3:32 PM

## 2013-02-06 NOTE — Progress Notes (Signed)
Patient ID: Angelica [name], female   DOB: [birth year], 32 y.o.   MRN: [id number] D. Patient presents with labile mood, suspicious , with paranoid disorganized thought process.She continues to be very gamey and difficult with medication administration, and very paranoid and suspicious of staff. In am she states '' I am having a headache, I need some tylenol. '' Writer pulled tylenol, unopened package, and opened in front of patient and then she refused stating '' No I've changed my mind. '' Attempted to give am medications as well, and patient noted to be suspicious of water, pouring water out that was provided to her and getting fresh from water fountain at medication window. She then refused all medications stating '' I don't need medications.'' She remains with poor insight and disorganized thought processes stating '' My family are the problem they are the ones that made me come in here, I volunteered to come in here because of them, But i'm not taking any medications you can talk to my lawyer. '' A. . Support and encouragement provided. Discussed above information with L. Earlene Plater NP, R. Patient remains in milieu, interacting with peers. She denies any further acute concerns at this time. Will continue to monitor q 15 minutes for safety.

## 2013-02-06 NOTE — Progress Notes (Signed)
Patient ID: Angelica [name], female   DOB: 1980/11/01, 32 y.o.   MRN: [id number] Psychoeducational Group Note  Date:  02/06/2013 Time:  [id number] am  Group Topic/Focus:  Making Healthy Choices:   The focus of this group is to help patients identify negative/unhealthy choices they were using prior to admission and identify positive/healthier coping strategies to replace them upon discharge.  Participation Level:  Active  Participation Quality:  Intrusive  Affect:  Anxious  Cognitive:  Disorganized  Insight:  Monopolizing  Engagement in Group:  Monopolizing  Additional Comments:  Inventory and Psychoeducational group- healthy support systems.   [name] 02/06/2013,11:54 AM

## 2013-02-06 NOTE — [facility] Group Notes (Signed)
[facility] Group Notes:  (Clinical Social Work)  02/06/2013   11:15am-12:00pm  Summary of Progress/Problems:  The main focus of today's process group was to listen to a variety of genres of music and to identify that different types of music provoke different responses.  The patient then was able to identify personally what was soothing for them, as well as energizing.  Handouts were used to record feelings evoked, as well as how patient can personally use this knowledge in sleep habits, with depression, and with other symptoms.  The patient expressed understanding of concepts, as well as knowledge of how each type of music affected them and how this can be used when they are at home as a tool in their recovery.  Angelica [name] responded to numerous music genres as ways she can improve her mood.  Type of Therapy:  Music Therapy   Participation Level:  Active  Participation Quality:  Attentive and Sharing  Affect:  Blunted  Cognitive:  Oriented  Insight:  Engaged  Engagement in Therapy:  Engaged  Modes of Intervention:   Activity, Exploration  Ambrose Mantle, LCSW 02/06/2013, 12:30pm

## 2013-02-07 NOTE — Progress Notes (Signed)
D:  Per pt self inventory pt reports sleeping fair, appetite good, energy level normal, ability to pay attention improving, rates depression at a 1 out of 10 and hopelessness at a 1 out of 10, denies SI/BHi/AVH, pt is very animated this am, laughing inappropriately during meeting with MD, bizarre and does not feel as though she should be here, pt still having paranoid delusions that her family and staff here at [facility] are trying to poison her, pt did take her am meds but would not drink the water provided to her by the writer d/t suspicion.    A:  Emotional support provided, Encouraged pt to continue with treatment plan and attend all group activities, q15 min checks maintained for safety.  R:  Pt is calm and cooperative, going to groups, insight is limited.

## 2013-02-07 NOTE — [facility] Group Notes (Signed)
[facility] LCSW Group Therapy  02/07/2013 1:15 pm  Type of Therapy: Process Group Therapy  Participation Level:  Active  Participation Quality:  Appropriate  Affect:  Flat  Cognitive:  Oriented  Insight:  Limited  Engagement in Group:  Limited  Engagement in Therapy:  Limited  Modes of Intervention:  Activity, Clarification, Education, Problem-solving and Support  Summary of Progress/Problems: Today's group addressed the issue of overcoming obstacles.  Patients were asked to identify their biggest obstacle post d/c that stands in the way of their on-going success, and then problem solve as to how to manage this.  Toryn stated her biggest obstacle is the people at home that she does not trust, and the fact that as a result she will have to move into a shelter, and take her children with her.  Feedback from group members was that a shelter is a place you only want to go to as a last resort, but if you must go, remember that it is only temporary and of course a mother needs to do whatever she can for the best interest of her children.  Of course, this feedback was without the benefit of her revealing the extent of her paranoid delusions.  Daryel Gerald B 02/07/2013   3:05 PM

## 2013-02-07 NOTE — Progress Notes (Signed)
t is observed in the dayroom watching TV and occasionally talking with peers.  Pt reports she is doing fine this evening and feels she is ready to go home.  Earlier shift nurse reported that pt has been refusing her meds until today.  She still thinks her family and staff here are trying to poison her.  RN reported pt took her meds this afternoon and swallowed them.  Pt says she may be going to a shelter.  Pt did not elaborate on why she made this decision.  Pt denies SI/HI/AV.  Pt was encouraged to make her needs known to staff.  Support and encouragement offered.  Safety maintained with q15 minute checks.

## 2013-02-07 NOTE — Progress Notes (Signed)
Patient ID: Angelica [name], female   DOB: 1980/11/20, 32 y.o.   MRN: [id number] D. The patient spent most of the evening interacting with her roommate. She has become overly attached referring to her as her best friend and identified her as her support system for her recovery plan. When her roommate had a headache and requested pain medication, she stated she had a headache too and needed medication. A. Met with patient to assess. Encouraged to attend evening group. Administered HS medication. R. Attended evening group. Poor judgement and limited insight.

## 2013-02-07 NOTE — Progress Notes (Signed)
Patient ID: Angelica [name], female   DOB: October 02, 1980, 32 y.o.   MRN: [id number]  [facility] MD Progress Note  02/07/2013 11:03 AM Angelica [name]  MRN:  [id number] Subjective:  "I'm ready to go home, I think you all are plotting with my family to poison me with your medications.'' Objective: Patient with poor insight and judgment. She has refused to take her medications since admission until today. She insist that her medications are poisions and that the hospital staff are plotting with her family to harm her. She remains very delusional, paranoid, confrontation, and partially. She has been reported by staffs to refuse our drinking water for fear of being poisoned.   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   HENT: Negative.   Eyes: Negative.   Respiratory: Negative.   Cardiovascular: Negative.   Gastrointestinal: Negative.   Genitourinary: Negative.   Musculoskeletal: Negative.   Skin: Negative.   Neurological: Negative.   Endo/Heme/Allergies: Negative.   Psychiatric/Behavioral: Positive for hallucinations. The patient is nervous/anxious and has insomnia.     Blood pressure 103/67, pulse 88, temperature 98.1 F (36.7 C), temperature source Oral, resp. rate 16, height 5\' 3"  (1.6 m), weight 86.183 kg (190 lb), last menstrual period 01/03/2013.Body mass index is 33.67 kg/(m^2).  General Appearance: Fairly Groomed  Patent attorney::  Fair  Speech:  Pressured  Volume:  Increased  Mood:  Angry and Irritable  Affect:   Labile  Thought Process:  Disorganized  Orientation:  Full (Time, Place, and Person)  Thought Content:  Delusions and Paranoid Ideation  Suicidal Thoughts:  No  Homicidal Thoughts:  No  Memory:  Immediate;   Fair Recent;   Fair Remote;   Fair  Judgement:  Poor  Insight:  Lacking  Psychomotor Activity:  Increased  Concentration:  Fair  Recall:  Fair  Akathisia:  No  Handed:  Right  AIMS (if indicated):     Assets:  Physical Health Social Support  Sleep:  Number of Hours: 6.5   Current Medications: Current Facility-Administered Medications  Medication Dose Route Frequency Provider Last Rate Last Dose  . acetaminophen (TYLENOL) tablet 650 mg  650 mg Oral Q6H PRN Shuvon Rankin, NP   650 mg at 02/04/13 1949  . alum & mag hydroxide-simeth (MAALOX/MYLANTA) 200-200-20 MG/5ML suspension 30 mL  30 mL Oral Q4H PRN Shuvon Rankin, NP      . benztropine (COGENTIN) tablet 1 mg  1 mg Oral BID Daci Stubbe   1 mg at 02/07/13 0815  . carbamazepine (TEGRETOL XR) 12 hr tablet 200 mg  200 mg Oral BID Bowdy Bair   200 mg at 02/07/13 0815  . haloperidol (HALDOL) tablet 5 mg  5 mg Oral BID Mckenize Mezera   5 mg at 02/07/13 0815  . ibuprofen (ADVIL,MOTRIN) tablet 600 mg  600 mg Oral Q6H PRN Fransisca Kaufmann, NP   600 mg at 02/06/13 2115  . magnesium hydroxide (MILK OF MAGNESIA) suspension 30 mL  30 mL Oral Daily PRN Shuvon Rankin, NP      . OLANZapine zydis (ZYPREXA) disintegrating tablet 10 mg  10 mg Oral  Q8H PRN Noah Lembke      . traZODone (DESYREL) tablet 50 mg  50 mg Oral QHS Talesha Ellithorpe   50 mg at 02/06/13 2114    Lab Results:  No results found for this or any previous visit (from the past 48 hour(s)).  Physical Findings: AIMS: Facial and Oral Movements Muscles of Facial Expression: None, normal Lips and Perioral Area: None, normal Jaw: None, normal Tongue: None, normal,Extremity Movements Upper (arms, wrists, hands, fingers): None, normal Lower (legs, knees, ankles, toes):  None, normal, Trunk Movements Neck, shoulders, hips: None, normal, Overall Severity Severity of abnormal movements (highest score from questions above): None, normal Incapacitation due to abnormal movements: None, normal Patient's awareness of abnormal movements (rate only patient's report): No Awareness, Dental Status Current problems with teeth and/or dentures?: Yes (left lower jaw cavity) Does patient usually wear dentures?: No  CIWA:    COWS:     Treatment Plan Summary: Daily contact with patient to assess and evaluate symptoms and progress in treatment Medication management  Plan:1. Continue crisis management and stabilization. 2. Medication management to reduce current symptoms to base line and improve the patient's overall level of functioning. Continue to encourage patient to be compliant with medications that are prescribed for her.  3. Treat health problems as indicated. 4. Develop treatment plan to decrease risk of relapse upon discharge and the need for readmission. 5. Psycho-social education regarding relapse prevention and self care. 6. Health care follow up as needed for medical problems. 7. Restart home medications where appropriate. 8. Continue Haldol to 5mg  po BID for delusions 9. Continue Tegretol XR 200mg  po bid for agitation. 10. Second opinion for forced medication completed by Dr. Elsie Saas in epic.   Medical Decision Making Problem Points:  Established problem, worsening (2), Review of last therapy session (1) and Review of psycho-social stressors (1) Data Points:  Order Aims Assessment (2) Review of medication regiment & side effects (2) Review of new medications or change in dosage (2)  I certify that inpatient services furnished can reasonably be expected to improve the patient's condition.   Espiridion Supinski,MD 02/07/2013, 11:03 AM

## 2013-02-07 NOTE — Progress Notes (Signed)
Recreation Therapy Notes  Date: 12.22.2014 Time: 9:30am Location: [address] Dayroom   Group Topic: Exercise/Wellness  Goal Area(s) Addresses:  Patient will verbalize benefit of exercise. Patient will identify how they can implement exercise into their lives post d/c.   Behavioral Response: Engaged, Attentive, Appropriate   Intervention: Game   Activity: LRT places 9 tiles on the floor, each tile containing a different exercise (arm circles, jogging in place, jumping jacks, leg lifts, plank, push-ups, sit-ups, stretching, touch toes). Patient was asked to toss a bean bag onto a tile and perform the exercise on that tile.   Education: Exercise, Wellness, Building control surveyor.   Education Outcome: Acknowledges understanding.   Clinical Observations/Feedback: Patient actively engaged in group activity, performing exercises requested. Patient identified benefit of exercise as it relates to sleep and helping her keep to a schedule.    Marykay Lex Seve Monette, LRT/CTRS  Prim Morace L 02/07/2013 12:30 PM

## 2013-02-07 NOTE — [facility] Group Notes (Signed)
[facility] LCSW Aftercare Discharge Planning Group Note   02/07/2013 11:25 AM  Participation Quality:  Engaged  Mood/Affect:  Flat  Depression Rating:  3  Anxiety Rating:  3  Thoughts of Suicide:  No Will you contract for safety?   NA  Current AVH:  No  Plan for Discharge/Comments:  States she is becoming increasingly paranoid "because of the medication you are giving me."  Denied that she has been refusing medication, but then accused nursing staff of treating her like a child "because they think I have not been taking the medication."  States she needs a list of shelters "because I will not be returning to live with family-they are all against me."  Transportation Means:  unk  Supports: none  [place], [place]

## 2013-02-08 NOTE — Tx Team (Signed)
  Interdisciplinary Treatment Plan Update   Date Reviewed:  02/08/2013  Time Reviewed:  8:26 AM  Progress in Treatment:   Attending groups: Yes Participating in groups: Yes Taking medication as prescribed: Yes  Tolerating medication: Yes Family/Significant other contact made: Yes  Patient understands diagnosis: Yes  Discussing patient identified problems/goals with staff: Yes Medical problems stabilized or resolved: Yes Denies suicidal/homicidal ideation: Yes Patient has not harmed self or others: Yes  For review of initial/current patient goals, please see plan of care.  Estimated Length of Stay:  Likely d/c tomorrow  Reason for Continuation of Hospitalization:   New Problems/Goals identified:  N/A  Discharge Plan or Barriers:   states she will stay in a shelter, but in actuality will likely return home, and follow up at [facility]  Additional Comments:  Angelica [name] continues to believe that both she and her children are in danger from her family, and she is not safe, nor are they.  She has consistently stated that she plans to take them to a shelter with her.  Began taking anti-psychotic for the first time yesterday.  Attendees:  Signature: Thedore Mins, MD 02/08/2013 8:26 AM   Signature: Richelle Ito, LCSW 02/08/2013 8:26 AM  Signature: Fransisca Kaufmann, NP 02/08/2013 8:26 AM  Signature: Joslyn Devon, RN 02/08/2013 8:26 AM  Signature: Liborio Nixon, RN 02/08/2013 8:26 AM  Signature:  02/08/2013 8:26 AM  Signature:   02/08/2013 8:26 AM  Signature:    Signature:    Signature:    Signature:    Signature:    Signature:      Scribe for Treatment Team:   Richelle Ito, LCSW  02/08/2013 8:26 AM

## 2013-02-08 NOTE — Progress Notes (Signed)
D:  Per pt self inventory pt reports sleeping fair, appetite good, energy level normal, ability to pay attention improving, rates depression at a 1 out of 10 and hopelessness at a 0 out of 10, pt is euphoric, superficial during interaction, denies SI/HI/AVH.    A:  Emotional support provided, Encouraged pt to continue with treatment plan and attend all group activities, q15 min checks maintained for safety.  R:  Pt is going to groups, cooperative with patients and staff

## 2013-02-08 NOTE — Progress Notes (Signed)
D:Pt is suspicious and guarded during interaction with Clinical research associate. Pt reports that she is waiting on a check from where she works at an anonymous job. 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   A:Offered support, redirection and 15 minute checks. R:Pt denies si and hi. Safety maintained on the unit.

## 2013-02-08 NOTE — B[facility] Group Notes (Signed)
[facility] LCSW Group Therapy  02/08/2013 , 3:15 PM   Type of Therapy:  Group Therapy  Participation Level:  Active  Participation Quality:  Attentive  Affect:  Appropriate  Cognitive:  Alert  Insight:  Limited  Engagement in Therapy:  Engaged  Modes of Intervention:  Discussion, Exploration and Socialization  Summary of Progress/Problems: Today's group focused on the term Diagnosis.  Participants were asked to define the term, and then pronounce whether it is a negative, positive or neutral term.  Angelica [name] presented in her usual somewhat disorganized and bizarre fashion with paranoid overtones.  Her examples of diagnosis were interesting, but heavily influenced by the fact that she believes there is nothing wrong with her, though she did admit to having some paranoid thoughts at one point in the group, but then became extremely anxious as a result, and stated she regretted saying that "as it will probably be used against me."  Others assured her it would not, while she accused everyone of "stigmatizing me,"  But was unable to give examples when pressed.  Angelica [name] 02/08/2013 , 3:15 PM

## 2013-02-08 NOTE — Progress Notes (Signed)
Patient ID: Angelica [name], female   DOB: 08/08/1980, 31 y.o.   MRN: [id number]  [facility] MD Progress Note  02/08/2013 10:15 AM Angelica [name]  MRN:  [id number] Subjective:  "Please I need you to discharge me before Christmas so that I can be celebrate Christmas with my children.''  Objective: Patient with  poor insight into her mental illness but has started taking medications since yesterday. She continues to insist that her medications are poisions and  remains very delusional, paranoid but less confrontation. So far, she has not reported any adverse reactions to her medications.  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   HENT: Negative.   Eyes: Negative.   Respiratory: Negative.   Cardiovascular: Negative.   Gastrointestinal: Negative.   Genitourinary: Negative.   Musculoskeletal: Negative.   Skin: Negative.   Neurological: Negative.   Endo/Heme/Allergies: Negative.   Psychiatric/Behavioral: Positive for hallucinations. The patient is nervous/anxious and has insomnia.     Blood pressure 117/72, pulse 82, temperature 98.6 F (37 C), temperature source Oral, resp. rate 20, height 5\' 3"  (1.6 m), weight 86.183 kg (190 lb), last menstrual period 01/03/2013.Body mass index is 33.67 kg/(m^2).  General Appearance: Fairly Groomed  Patent attorney::  Fair  Speech:  Pressured  Volume:  Increased  Mood:   Irritable  Affect:  Labile  Thought Process:  Disorganized  Orientation:  Full (Time, Place, and Person)   Thought Content:  Delusions and Paranoid Ideation  Suicidal Thoughts:  No  Homicidal Thoughts:  No  Memory:  Immediate;   Fair Recent;   Fair Remote;   Fair  Judgement:  Poor  Insight:  Lacking  Psychomotor Activity:  Increased  Concentration:  Fair  Recall:  Fair  Akathisia:  No  Handed:  Right  AIMS (if indicated):     Assets:  Physical Health Social Support  Sleep:  Number of Hours: 6.75   Current Medications: Current Facility-Administered Medications  Medication Dose Route Frequency Provider Last Rate Last Dose  . acetaminophen (TYLENOL) tablet 650 mg  650 mg Oral Q6H PRN Shuvon Rankin, NP   650 mg at 02/08/13 0756  . alum & mag hydroxide-simeth (MAALOX/MYLANTA) 200-200-20 MG/5ML suspension 30 mL  30 mL Oral Q4H PRN Shuvon Rankin, NP      . benztropine (COGENTIN) tablet 1 mg  1 mg Oral BID Ryla Cauthon   1 mg at 02/08/13 0754  . carbamazepine (TEGRETOL XR) 12 hr tablet 200 mg  200 mg Oral BID Emelyn Roen   200 mg at 02/08/13 0754  . haloperidol (HALDOL) tablet 5 mg  5 mg Oral BID Dekari Bures   5 mg at 02/08/13 0754  . ibuprofen (ADVIL,MOTRIN) tablet 600 mg  600 mg Oral Q6H PRN Fransisca Kaufmann, NP   600 mg at 02/07/13 2127  . magnesium hydroxide (MILK OF MAGNESIA) suspension 30 mL  30 mL Oral Daily PRN Shuvon Rankin, NP      . OLANZapine zydis (ZYPREXA) disintegrating tablet 10 mg  10 mg Oral Q8H PRN Tyrie Porzio      . traZODone (DESYREL) tablet 50 mg  50  mg Oral QHS Darric Plante   50 mg at 02/06/13 2114    Lab Results:  No results found for this or any previous visit (from the past 48 hour(s)).  Physical Findings: AIMS: Facial and Oral Movements Muscles of Facial Expression: None, normal Lips and Perioral Area: None, normal Jaw: None, normal Tongue: None, normal,Extremity Movements Upper (arms, wrists, hands, fingers): None, normal Lower (legs, knees, ankles, toes): None, normal, Trunk Movements Neck, shoulders, hips: None, normal, Overall  Severity Severity of abnormal movements (highest score from questions above): None, normal Incapacitation due to abnormal movements: None, normal Patient's awareness of abnormal movements (rate only patient's report): No Awareness, Dental Status Current problems with teeth and/or dentures?: Yes (left lower jaw cavity) Does patient usually wear dentures?: No  CIWA:    COWS:     Treatment Plan Summary: Daily contact with patient to assess and evaluate symptoms and progress in treatment Medication management  Plan:1. Continue crisis management and stabilization. 2. Medication management to reduce current symptoms to base line and improve the patient's overall level of functioning. Continue to encourage patient to be compliant with medications that are prescribed for her.  3. Treat health problems as indicated. 4. Develop treatment plan to decrease risk of relapse upon discharge and the need for readmission. 5. Psycho-social education regarding relapse prevention and self care. 6. Health care follow up as needed for medical problems. 7. Restart home medications where appropriate. 8. Continue Haldol to 5mg  po BID for delusions 9. Continue Tegretol XR 200mg  po bid for agitation. 10. Second opinion for forced medication completed by Dr. Elsie Saas in epic.   Medical Decision Making Problem Points:  Established problem, worsening (2), Review of last therapy session (1) and Review of psycho-social stressors (1) Data Points:  Order Aims Assessment (2) Review of medication regiment & side effects (2) Review of new medications or change in dosage (2)  I certify that inpatient services furnished can reasonably be expected to improve the patient's condition.   Joy Reiger,MD 02/08/2013, 10:15 AM

## 2013-02-08 NOTE — Progress Notes (Signed)
Pt has been visible on the unit, with minimal interaction with her peers.  Pt is still suspicious of staff.  Previous RN reported that pt refused her afternoon medications.  She would not take them from writer, but she did request med for heartburn which was given.  Even though pt asked for the med, she was hesitant to take it and studied the packaging before taking it.  Pt states she does not need her scheduled sleep aid.  Pt reports she is doing well and wants to be discharged Wednesday to spend Christmas with her kids.  She does not want to return to her mother's home, but plans to go to a shelter and take her kids with her.  Pt denies SI/HI/AV at this time.  Pt makes her needs known to staff.  Support and encouragement offered.  Safety maintained with q15 minute checks.

## 2013-02-08 NOTE — BHH Group Notes (Signed)
Adult Psychoeducational Group Note  Date:  02/08/2013 Time:  8:35 PM  Group Topic/Focus:  Wrap-Up Group:   The focus of this group is to help patients review their daily goal of treatment and discuss progress on daily workbooks.  Participation Level:  Minimal  Participation Quality:  Appropriate  Affect:  Flat  Cognitive:  Appropriate  Insight: Appropriate  Engagement in Group:  Limited  Modes of Intervention:  Discussion  Additional Comments:  Brigitta said her day was ok and hopefully she will go home tomorrow.  She also expressed that she is looking forward to seeing her children.  In addition, she stated she is working on a recovery plan to work on stress when she is released.  Caroll Rancher A 02/08/2013, 8:35 PM

## 2013-02-08 NOTE — Progress Notes (Signed)
Pt continues to be paranoid and guarded. Pt refuses to take her medication. Encouraged pt to take medication at the medication window and again in her room. Mental health tech also encouraged pt to take medication. Pt continues to refuse. Pt then reported that the medication bothers her esophagus. Pt breathing WNL.Respirations even and unlabored. Pt later reported that she needs caffeine pills no ibuprofen as she was given earlier in the day.

## 2013-02-08 NOTE — Progress Notes (Signed)
Adult Psychoeducational Group Note  Date:  02/08/2013 Time:  10:46 AM  Group Topic/Focus:  Recovery Goals:   The focus of this group is to identify appropriate goals for recovery and establish a plan to achieve them.  Participation Level:  Minimal  Participation Quality:  Inattentive and Redirectable  Affect:  Blunted and Irritable  Cognitive:  Confused  Insight: Lacking  Engagement in Group:  Off Topic and Poor  Modes of Intervention:  Discussion and Education  Additional Comments:  Pt was off topic and had to be redirected several times.  Renly Guedes E 02/08/2013, 10:46 AM

## 2013-02-09 MED ORDER — BENZTROPINE MESYLATE 1 MG PO TABS: 1.0000 mg | ORAL_TABLET | Freq: Two times a day (BID) | ORAL | Status: DC

## 2013-02-09 MED ORDER — HALOPERIDOL 5 MG PO TABS: 5.0000 mg | ORAL_TABLET | Freq: Two times a day (BID) | ORAL | Status: DC

## 2013-02-09 MED ORDER — TRAZODONE HCL 50 MG PO TABS: 50.0000 mg | ORAL_TABLET | Freq: Every day | ORAL | Status: DC

## 2013-02-09 MED ORDER — CARBAMAZEPINE ER 200 MG PO TB12: 200.0000 mg | ORAL_TABLET | Freq: Two times a day (BID) | ORAL | Status: DC

## 2013-02-09 NOTE — Progress Notes (Signed)
Pt discharged per MD orders; pt currently denies SI/HI and auditory/visual hallucinations; pt was given education by RN regarding follow-up appointments and medications and pt denied any questions or concerns about these instructions; pt was then escorted to search room to retrieve her belongings by RN before being discharged to hospital lobby. 

## 2013-02-09 NOTE — Progress Notes (Signed)
[facility] Adult Case Management Discharge Plan :  Will you be returning to the same living situation after discharge: Yes,  home At discharge, do you have transportation home?:Yes,  bus pass Do you have the ability to pay for your medications:Yes,  mental health  Release of information consent forms completed and in the chart;  Patient's signature needed at discharge.  Patient to Follow up at: Follow-up Information   Follow up with [facility]. (Go to the walk-in clinic M-F between 8 and9AM for your hospital follow up appointment)    Contact information:   [address]  [place]  [phone]      Patient denies SI/HI:   Yes,  yes    Safety Planning and Suicide Prevention discussed:  Yes,  yes  Daryel Gerald B 02/09/2013, 10:06 AM
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
## 2013-02-09 NOTE — [facility] Suicide Risk Assessment (Signed)
Suicide Risk Assessment  Discharge Assessment     Demographic Factors:  Low socioeconomic status and Unemployed  Mental Status Per Nursing Assessment::   On Admission:  NA  Current Mental Status by Physician: patient denies suicidal ideation, intent or plan  Loss Factors: Financial problems/change in socioeconomic status  Historical Factors: Impulsivity  Risk Reduction Factors:   Sense of responsibility to family and Positive social support  Continued Clinical Symptoms:  Resolving delusional thinking  Cognitive Features That Contribute To Risk:  Closed-mindedness Polarized thinking    Suicide Risk:  Minimal: No identifiable suicidal ideation.  Patients presenting with no risk factors but with morbid ruminations; may be classified as minimal risk based on the severity of the depressive symptoms  Discharge Diagnoses:   AXIS I:  Chronic Paranoid Schizophrenia AXIS II:  Deferred AXIS III:   Past Medical History  Diagnosis Date  . No pertinent past medical history   . Medical history non-contributory    AXIS IV:  other psychosocial or environmental problems, problems related to social environment and problems with primary support group AXIS V:  61-70 mild symptoms  Plan Of Care/Follow-up recommendations:  Activity:  as tolerated Diet:  healthy Tests:  routine Other:  patient to keep her after care appointment  Is patient on multiple antipsychotic therapies at discharge:  No   Has Patient had three or more failed trials of antipsychotic monotherapy by history:  No  Recommended Plan for Multiple Antipsychotic Therapies: NA  Thedore Mins, MD 02/09/2013, 10:21 AM

## 2013-02-14 NOTE — Progress Notes (Signed)
Patient Discharge Instructions:  After Visit Summary (AVS):   Faxed to:  02/14/13 Discharge Summary Note:   Faxed to:  02/14/13 Psychiatric Admission Assessment Note:   Faxed to:  02/14/13 Suicide Risk Assessment - Discharge Assessment:   Faxed to:  02/14/13 Faxed/Sent to the Next Level Care provider:  02/14/13 Faxed to [facility] @ [phone]  Jerelene Redden, 02/14/2013, 4:41 PM

## 2013-07-30 ENCOUNTER — Emergency Department (HOSPITAL_COMMUNITY)
Admission: EM | Admit: 2013-07-30 | Discharge: 2013-08-01 | Payer: Medicaid Other | Attending: Emergency Medicine | Admitting: Emergency Medicine

## 2013-07-30 ENCOUNTER — Encounter (HOSPITAL_COMMUNITY): Payer: Self-pay | Admitting: Emergency Medicine

## 2013-07-30 DIAGNOSIS — F23 Brief psychotic disorder: Secondary | ICD-10-CM

## 2013-07-30 DIAGNOSIS — F17200 Nicotine dependence, unspecified, uncomplicated: Secondary | ICD-10-CM | POA: Insufficient documentation

## 2013-07-30 DIAGNOSIS — R443 Hallucinations, unspecified: Secondary | ICD-10-CM | POA: Insufficient documentation

## 2013-07-30 DIAGNOSIS — Z79899 Other long term (current) drug therapy: Secondary | ICD-10-CM | POA: Insufficient documentation

## 2013-07-30 DIAGNOSIS — IMO0002 Reserved for concepts with insufficient information to code with codable children: Secondary | ICD-10-CM | POA: Insufficient documentation

## 2013-07-30 DIAGNOSIS — Z3202 Encounter for pregnancy test, result negative: Secondary | ICD-10-CM | POA: Insufficient documentation

## 2013-07-30 DIAGNOSIS — Z9119 Patient's noncompliance with other medical treatment and regimen: Secondary | ICD-10-CM | POA: Insufficient documentation

## 2013-07-30 DIAGNOSIS — F39 Unspecified mood [affective] disorder: Secondary | ICD-10-CM | POA: Diagnosis present

## 2013-07-30 DIAGNOSIS — F911 Conduct disorder, childhood-onset type: Secondary | ICD-10-CM | POA: Insufficient documentation

## 2013-07-30 DIAGNOSIS — F29 Unspecified psychosis not due to a substance or known physiological condition: Secondary | ICD-10-CM | POA: Diagnosis present

## 2013-07-30 DIAGNOSIS — Z91199 Patient's noncompliance with other medical treatment and regimen due to unspecified reason: Secondary | ICD-10-CM | POA: Insufficient documentation

## 2013-07-30 DIAGNOSIS — F200 Paranoid schizophrenia: Secondary | ICD-10-CM | POA: Diagnosis present

## 2013-07-30 HISTORY — DX: Paranoid schizophrenia: F20.0

## 2013-07-30 HISTORY — DX: Migraine, unspecified, not intractable, without status migrainosus: G43.909

## 2013-07-30 LAB — RAPID URINE DRUG SCREEN, HOSP PERFORMED
AMPHETAMINES: NOT DETECTED
BARBITURATES: NOT DETECTED
Benzodiazepines: NOT DETECTED
Cocaine: NOT DETECTED
Opiates: NOT DETECTED
Tetrahydrocannabinol: NOT DETECTED

## 2013-07-30 LAB — CBC WITH DIFFERENTIAL/PLATELET
BASOS ABS: 0 10*3/uL (ref 0.0–0.1)
BASOS PCT: 0 % (ref 0–1)
Eosinophils Absolute: 0.4 10*3/uL (ref 0.0–0.7)
Eosinophils Relative: 4 % (ref 0–5)
HCT: 42.1 % (ref 36.0–46.0)
Hemoglobin: 14.3 g/dL (ref 12.0–15.0)
Lymphocytes Relative: 31 % (ref 12–46)
Lymphs Abs: 3.1 10*3/uL (ref 0.7–4.0)
MCH: 28.9 pg (ref 26.0–34.0)
MCHC: 34 g/dL (ref 30.0–36.0)
MCV: 85.2 fL (ref 78.0–100.0)
Monocytes Absolute: 0.6 10*3/uL (ref 0.1–1.0)
Monocytes Relative: 6 % (ref 3–12)
NEUTROS ABS: 5.7 10*3/uL (ref 1.7–7.7)
NEUTROS PCT: 59 % (ref 43–77)
PLATELETS: 235 10*3/uL (ref 150–400)
RBC: 4.94 MIL/uL (ref 3.87–5.11)
RDW: 13.7 % (ref 11.5–15.5)
WBC: 9.7 10*3/uL (ref 4.0–10.5)

## 2013-07-30 LAB — COMPREHENSIVE METABOLIC PANEL
ALBUMIN: 3.9 g/dL (ref 3.5–5.2)
ALT: 21 U/L (ref 0–35)
AST: 15 U/L (ref 0–37)
Alkaline Phosphatase: 66 U/L (ref 39–117)
BUN: 16 mg/dL (ref 6–23)
CALCIUM: 9.4 mg/dL (ref 8.4–10.5)
CHLORIDE: 102 meq/L (ref 96–112)
CO2: 25 mEq/L (ref 19–32)
Creatinine, Ser: 0.91 mg/dL (ref 0.50–1.10)
GFR calc Af Amer: >90 mL/min (ref >90)
GFR, EST NON AFRICAN AMERICAN: 83 mL/min — AB (ref >90)
Glucose, Bld: 113 mg/dL — ABNORMAL HIGH (ref 70–99)
Potassium: 4.1 mEq/L (ref 3.7–5.3)
SODIUM: 139 meq/L (ref 137–147)
Total Bilirubin: 0.3 mg/dL (ref 0.3–1.2)
Total Protein: 7.5 g/dL (ref 6.0–8.3)

## 2013-07-30 LAB — ACETAMINOPHEN LEVEL: Acetaminophen (Tylenol), Serum: <15 ug/mL (ref 10–30)

## 2013-07-30 LAB — ETHANOL: Alcohol, Ethyl (B): <11 mg/dL (ref 0–11)

## 2013-07-30 LAB — PREGNANCY, URINE: Preg Test, Ur: NEGATIVE

## 2013-07-30 LAB — SALICYLATE LEVEL

## 2013-07-30 MED ORDER — LORAZEPAM 2 MG/ML IJ SOLN
1.0000 mg | Freq: Once | INTRAMUSCULAR | Status: AC
Start: 2013-07-30 — End: 2013-07-30
  Administered 2013-07-30: 1 mg via INTRAMUSCULAR
  Filled 2013-07-30: qty 1

## 2013-07-30 MED ORDER — ONDANSETRON HCL 4 MG PO TABS: 4.0000 mg | ORAL_TABLET | Freq: Three times a day (TID) | ORAL | Status: DC | PRN

## 2013-07-30 MED ORDER — CARBAMAZEPINE ER 200 MG PO TB12
200.0000 mg | ORAL_TABLET | Freq: Two times a day (BID) | ORAL | Status: DC
  Administered 2013-07-30 – 2013-07-31 (×3): 200 mg via ORAL
  Filled 2013-07-30 (×5): qty 1

## 2013-07-30 MED ORDER — ZOLPIDEM TARTRATE 5 MG PO TABS: 5.0000 mg | ORAL_TABLET | Freq: Every evening | ORAL | Status: DC | PRN

## 2013-07-30 MED ORDER — ALUM & MAG HYDROXIDE-SIMETH 200-200-20 MG/5ML PO SUSP: 30.0000 mL | ORAL | Status: DC | PRN

## 2013-07-30 MED ORDER — BENZTROPINE MESYLATE 1 MG PO TABS
1.0000 mg | ORAL_TABLET | Freq: Two times a day (BID) | ORAL | Status: DC
  Administered 2013-07-30 – 2013-07-31 (×3): 1 mg via ORAL
  Filled 2013-07-30 (×5): qty 1

## 2013-07-30 MED ORDER — LORAZEPAM 1 MG PO TABS: 1.0000 mg | ORAL_TABLET | Freq: Three times a day (TID) | ORAL | Status: DC | PRN

## 2013-07-30 MED ORDER — NICOTINE 21 MG/24HR TD PT24
21.0000 mg | MEDICATED_PATCH | Freq: Every day | TRANSDERMAL | Status: DC
  Administered 2013-07-31 – 2013-08-01 (×2): 21 mg via TRANSDERMAL
  Filled 2013-07-30 (×2): qty 1

## 2013-07-30 MED ORDER — HALOPERIDOL 5 MG PO TABS
5.0000 mg | ORAL_TABLET | Freq: Two times a day (BID) | ORAL | Status: DC
  Administered 2013-07-30 – 2013-07-31 (×4): 5 mg via ORAL
  Filled 2013-07-30 (×5): qty 1

## 2013-07-30 MED ORDER — IBUPROFEN 200 MG PO TABS
600.0000 mg | ORAL_TABLET | Freq: Three times a day (TID) | ORAL | Status: DC | PRN
  Administered 2013-07-30: 600 mg via ORAL
  Filled 2013-07-30: qty 3

## 2013-07-30 NOTE — Progress Notes (Signed)
Pt's referral has been faxed to the following facilities with bed availability:  [facility]- per [facility] per [facility]- [facility]- [facility]- [facility]   Laura Caldas Disposition MHT
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
## 2013-07-30 NOTE — BH Assessment (Signed)
Spoke with Antony MaduraKelly Humes, PA-C who stated that patient was brought in under IVC petition. Pt has not been taking her medication and has been aggressive with [employer]. Pt is agitated and appears to be responding to internal stimuli. No SI/HI reported. Assessment will be initiated.
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
## 2013-07-31 ENCOUNTER — Encounter (HOSPITAL_COMMUNITY): Payer: Self-pay | Admitting: Registered Nurse

## 2013-07-31 DIAGNOSIS — F200 Paranoid schizophrenia: Secondary | ICD-10-CM

## 2013-07-31 LAB — URINALYSIS, ROUTINE W REFLEX MICROSCOPIC
Bilirubin Urine: NEGATIVE
Glucose, UA: NEGATIVE mg/dL
HGB URINE DIPSTICK: NEGATIVE
KETONES UR: NEGATIVE mg/dL
NITRITE: NEGATIVE
PH: 6 (ref 5.0–8.0)
Protein, ur: NEGATIVE mg/dL
Specific Gravity, Urine: 1.022 (ref 1.005–1.030)
Urobilinogen, UA: 1 mg/dL (ref 0.0–1.0)

## 2013-07-31 LAB — URINE MICROSCOPIC-ADD ON

## 2013-07-31 NOTE — Progress Notes (Signed)
Follow-up calls have been placed to the following facilities regarding status of referral:  [facility]- per Tammy pt declined d/t acuity [facility]- continue to get voicemail [facility]- per Arline Aspindy at [facility]- per Olegario MessierKathy at capacity, will begin reviewing referral tomorrow 6.15.15 [facility]- per Elijah Birkom did not receive, referral re-faxed  The following facilities have been contacted and are at capacity: [facility] per Victorino DikeJennifer at "high acuity"  [facility]- per Olegario MessierKathy at capacity  [facility]- [facility] [place]  [facility] [place]- per Zollie Scalelivia at [facility]  [place]- per [facility]  [facility]- per Rosey Batheresa at [employer]  [facility]- per [facility] adolescent beds only  [facility]- per [place] kid beds only  [facility]- at [facility]  [facility]- per Verlon AuLeslie at capacity  [facility]- per Sue LushAndrea at [employer]  [facility]- per Sawyerarly at [employer]  [facility]- per Delaney Meigsamara not reviewing referrals until Monday  [facility]- at capacity per [employer]a  [facility]- "a few gero beds only"   [facility] Teena Mangus Disposition [facility]
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
## 2013-07-31 NOTE — ED Provider Notes (Signed)
Medical screening examination/treatment/procedure(s) were performed by non-physician practitioner and as supervising physician I was immediately available for consultation/collaboration.   EKG Interpretation None       Derwood KaplanAnkit Candida Vetter, MD 07/31/13 [phone]

## 2013-08-01 ENCOUNTER — Encounter (HOSPITAL_COMMUNITY): Payer: Self-pay | Admitting: *Deleted

## 2013-08-01 ENCOUNTER — Inpatient Hospital Stay (HOSPITAL_COMMUNITY)
Admission: AD | Admit: 2013-08-01 | Discharge: 2013-08-09 | DRG: 885 | Disposition: A | Discharge status: Home / Self Care | Payer: Medicaid Other | Source: Intra-hospital | Attending: Psychiatry | Admitting: Psychiatry

## 2013-08-01 ENCOUNTER — Encounter (HOSPITAL_COMMUNITY): Payer: Self-pay | Admitting: Psychiatry

## 2013-08-01 DIAGNOSIS — F329 Major depressive disorder, single episode, unspecified: Secondary | ICD-10-CM | POA: Diagnosis present

## 2013-08-01 DIAGNOSIS — Z91199 Patient's noncompliance with other medical treatment and regimen due to unspecified reason: Secondary | ICD-10-CM

## 2013-08-01 DIAGNOSIS — F411 Generalized anxiety disorder: Secondary | ICD-10-CM | POA: Diagnosis present

## 2013-08-01 DIAGNOSIS — Z9119 Patient's noncompliance with other medical treatment and regimen: Secondary | ICD-10-CM

## 2013-08-01 DIAGNOSIS — F3289 Other specified depressive episodes: Secondary | ICD-10-CM | POA: Diagnosis present

## 2013-08-01 DIAGNOSIS — F200 Paranoid schizophrenia: Principal | ICD-10-CM | POA: Diagnosis present

## 2013-08-01 DIAGNOSIS — F29 Unspecified psychosis not due to a substance or known physiological condition: Secondary | ICD-10-CM | POA: Diagnosis present

## 2013-08-01 DIAGNOSIS — F17200 Nicotine dependence, unspecified, uncomplicated: Secondary | ICD-10-CM | POA: Diagnosis present

## 2013-08-01 DIAGNOSIS — Z800 Family history of malignant neoplasm of digestive organs: Secondary | ICD-10-CM

## 2013-08-01 DIAGNOSIS — G4700 Insomnia, unspecified: Secondary | ICD-10-CM | POA: Diagnosis present

## 2013-08-01 MED ORDER — BENZTROPINE MESYLATE 1 MG/ML IJ SOLN
1.0000 mg | Freq: Two times a day (BID) | INTRAMUSCULAR | Status: DC
  Administered 2013-08-01: 1 mg via INTRAMUSCULAR
  Filled 2013-08-01: qty 2

## 2013-08-01 MED ORDER — BENZTROPINE MESYLATE 1 MG/ML IJ SOLN
1.0000 mg | Freq: Two times a day (BID) | INTRAMUSCULAR | Status: DC
  Filled 2013-08-01 (×4): qty 1

## 2013-08-01 MED ORDER — HALOPERIDOL LACTATE 5 MG/ML IJ SOLN
5.0000 mg | Freq: Two times a day (BID) | INTRAMUSCULAR | Status: DC
  Filled 2013-08-01 (×4): qty 1

## 2013-08-01 MED ORDER — ACETAMINOPHEN 325 MG PO TABS
650.0000 mg | ORAL_TABLET | Freq: Four times a day (QID) | ORAL | Status: DC | PRN
  Administered 2013-08-04 – 2013-08-09 (×4): 650 mg via ORAL
  Filled 2013-08-01 (×4): qty 2

## 2013-08-01 MED ORDER — HALOPERIDOL 5 MG PO TABS
ORAL_TABLET | ORAL | Status: AC
  Administered 2013-08-01: 5 mg
  Filled 2013-08-01: qty 1

## 2013-08-01 MED ORDER — HALOPERIDOL LACTATE 5 MG/ML IJ SOLN
5.0000 mg | Freq: Two times a day (BID) | INTRAMUSCULAR | Status: DC
  Administered 2013-08-01: 5 mg via INTRAMUSCULAR
  Filled 2013-08-01: qty 1

## 2013-08-01 MED ORDER — LORAZEPAM 1 MG PO TABS
2.0000 mg | ORAL_TABLET | Freq: Once | ORAL | Status: AC
  Administered 2013-08-01: 2 mg via ORAL
  Filled 2013-08-01: qty 2

## 2013-08-01 MED ORDER — CARBAMAZEPINE ER 200 MG PO TB12
200.0000 mg | ORAL_TABLET | Freq: Two times a day (BID) | ORAL | Status: DC
  Administered 2013-08-01 – 2013-08-09 (×16): 200 mg via ORAL
  Filled 2013-08-01 (×21): qty 1

## 2013-08-01 MED ORDER — BENZTROPINE MESYLATE 1 MG PO TABS
ORAL_TABLET | ORAL | Status: AC
  Administered 2013-08-01: 1 mg
  Filled 2013-08-01: qty 1

## 2013-08-01 MED ORDER — ALUM & MAG HYDROXIDE-SIMETH 200-200-20 MG/5ML PO SUSP
30.0000 mL | ORAL | Status: DC | PRN
  Administered 2013-08-06 (×3): 30 mL via ORAL

## 2013-08-01 MED ORDER — MAGNESIUM HYDROXIDE 400 MG/5ML PO SUSP: 30.0000 mL | Freq: Every day | ORAL | Status: DC | PRN

## 2013-08-01 NOTE — Progress Notes (Signed)
The focus of this group is to help patients review their daily goal of treatment and discuss progress on daily workbooks. Pt attended the evening group session and responded to all discussion prompts from the Writer. Pt shared that the highlight of her day was was getting to speak to her children on the phone. "I don't usually call them when I'm in places like this because I feel ashamed, but I wanted them to know I was okay." Pt had no additional requests from Nursing Staff this evening. Pt told the group that she wanted to be well upon discharge and, more importantly to her, maintain that wellness. "I need to work on maintaining it when I get out this time." Pt's affect was appropriate.

## 2013-08-01 NOTE — Progress Notes (Signed)
Pt was out the room without socks, writer explained to pt that she needed to have the socks with the grips for safety. Pt was upset, got loud with Clinical research associatewriter and said she is not like the other people here and don't talk to her like that.

## 2013-08-01 NOTE — ED Notes (Signed)
Patient reports that she wants to see doctor this morning so she can go home. Encouragement and support provided and safety maintain.
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
## 2013-08-01 NOTE — Progress Notes (Signed)
D: Pt denies SI/HI/AVH. Pt is tangential, delusional, paranoid, and argumentative.   A: Pt was offered support and encouragement. Pt was given scheduled medications. Pt was encourage to attend groups. Q 15 minute checks were done for safety.   R: safety maintained on unit.
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
## 2013-08-01 NOTE — Progress Notes (Signed)
Pt came back to the medication stating "you want me to take some more, I did take the medication, you saw me take the medication"."that was paper in my mouth i spit out"

## 2013-08-01 NOTE — ED Notes (Signed)
Pt provided with coloring pages. 

## 2013-08-01 NOTE — ED Notes (Signed)
Pt overheard calling for assistance from bathroom. Pt found standing in bathroom, wet and nude, saying she was going to pass out. Writer was able to assist pt to the toilet to sit. She was tachypneic and appeared anxious. SpO2 was 100%. NP called in to assess. Pt states she is feeling increasingly anxious but was able to calm in our presence. Pt was assisted to bed and reminded of call bell location and encouraged her to push call bell if she needs to get up or has a return of symptoms. Orders received.

## 2013-08-01 NOTE — ED Notes (Signed)
Patient attempted to spit her medications back into her cup in an attempt to fool writer into thinking she took her meds. When writer pointed out the pills in the cup, pt tried to ignore the question. Writer took a spoon from her tray, retrieved the pills from the cup and attempted to give them to her, but she then outwardly refused to take her meds.

## 2013-08-01 NOTE — Progress Notes (Signed)
When giving medications pt tried to cheek medications, pt spit them out and stated she took them . Pt may not not be trusted to take PO medications.

## 2013-08-01 NOTE — Progress Notes (Addendum)
CSW called [facility] actt team, to obtain collateral information at [phone].   Byrd HesselbachKristen Ayuub Penley, LCSW [phone]  ED CSW 08/01/2013 1127am   CSW attempted to reach patient mother who is listed as patient guardian. Pt grandmother answered the phone, and Ms. Madilyn FiremanHayes is currently at work and unable to be reached at this time. Pt mother/guardian to call CSW back around lunch time, once Pt grandmother isa bel to reach pt mother/guardian.   Byrd HesselbachKristen Berry Gallacher, LCSW [phone]  ED CSW 08/01/2013 1134am

## 2013-08-01 NOTE — Progress Notes (Signed)
Pt anticipated to have [facility] hall bed [facility] per [facility].   Byrd HesselbachKristen Audry Kauzlarich, LCSW [phone]  ED CSW 08/01/2013 1215pm

## 2013-08-01 NOTE — Progress Notes (Signed)
Pt came back to the medication window arguing with Clinical research associatewriter. "I have the right to refuse medication" pt was informed that was true and it may be possible for the dr. To give you a forced medication order if it is in your best interest.

## 2013-08-01 NOTE — Progress Notes (Signed)
Pt accepted to 402-2, by Catha NottinghamJamison to Dr. Jannifer FranklinAkintayo. Pt to be transported by sheriff under IVC. Pt guardianship papers to be transferred with patient.   Byrd HesselbachKristen Roshni Burbano, LCSW [phone]  ED CSW 08/01/2013 1246pm

## 2013-08-02 ENCOUNTER — Encounter (HOSPITAL_COMMUNITY): Payer: Self-pay | Admitting: Psychiatry

## 2013-08-02 MED ORDER — OLANZAPINE 10 MG PO TBDP
10.0000 mg | ORAL_TABLET | Freq: Three times a day (TID) | ORAL | Status: DC | PRN
  Administered 2013-08-07 – 2013-08-08 (×2): 10 mg via ORAL
  Filled 2013-08-02 (×2): qty 1

## 2013-08-02 MED ORDER — NICOTINE 21 MG/24HR TD PT24
21.0000 mg | MEDICATED_PATCH | Freq: Every day | TRANSDERMAL | Status: DC
  Administered 2013-08-03 – 2013-08-09 (×7): 21 mg via TRANSDERMAL
  Filled 2013-08-02 (×10): qty 1

## 2013-08-02 MED ORDER — DIPHENHYDRAMINE-ZINC ACETATE 2-0.1 % EX CREA
TOPICAL_CREAM | Freq: Two times a day (BID) | CUTANEOUS | Status: DC | PRN
  Administered 2013-08-02 – 2013-08-06 (×4): via TOPICAL
  Filled 2013-08-02: qty 28

## 2013-08-02 MED ORDER — PALIPERIDONE ER 6 MG PO TB24
6.0000 mg | ORAL_TABLET | Freq: Every day | ORAL | Status: DC
  Administered 2013-08-02 – 2013-08-09 (×8): 6 mg via ORAL
  Filled 2013-08-02 (×9): qty 1

## 2013-08-02 MED ORDER — BENZTROPINE MESYLATE 1 MG PO TABS
1.0000 mg | ORAL_TABLET | Freq: Every day | ORAL | Status: DC
  Administered 2013-08-02 – 2013-08-09 (×8): 1 mg via ORAL
  Filled 2013-08-02 (×10): qty 1

## 2013-08-02 MED ORDER — TRAZODONE HCL 50 MG PO TABS
50.0000 mg | ORAL_TABLET | Freq: Every evening | ORAL | Status: DC | PRN
  Filled 2013-08-02: qty 3

## 2013-08-02 NOTE — Consult Note (Signed)
Patient seen, evaluated by me. Patient is psychotic and needs inpatient psychiatric admission

## 2013-08-02 NOTE — Progress Notes (Signed)
D: Pt denies SI/HI/AVH. Pt  cooperative. Pt  Avoids Clinical research associatewriter but answers questions. Pt still appears to be delusional and confrontational.  A: Pt was offered support and encouragement. Pt was given scheduled medications. Pt was encourage to attend groups. Q 15 minute checks were done for safety.   R:Pt attends groups and interacts well with peers and staff. Pt is taking medication. Pt receptive to treatment and safety maintained on unit.

## 2013-08-02 NOTE — Tx Team (Signed)
  Interdisciplinary Treatment Plan Update   Date Reviewed:  08/02/2013  Time Reviewed:  8:19 AM  Progress in Treatment:   Attending groups: Yes Participating in groups: Yes Taking medication as prescribed: Yes  Tolerating medication: Yes Family/Significant other contact made: No  Patient understands diagnosis: No  Limited insight Discussing patient identified problems/goals with staff: Yes  See initial care plan Medical problems stabilized or resolved: Yes Denies suicidal/homicidal ideation: Yes In tx team Patient has not harmed self or others: Yes  For review of initial/current patient goals, please see plan of care.  Estimated Length of Stay:  4-5 days  Reason for Continuation of Hospitalization: Delusions  Hallucinations Medication stabilization Other; describe labile mood  New Problems/Goals identified:  N/A  Discharge Plan or Barriers:   return home, follow up with ACT team  Additional Comments:   Angelica [name] is an 33 y.o. female [facility] after being IVC'd by her mother. Pt stated "I don't know why I am here". "They say that I was spazzing".  Pt is alert and oriented to person. Pt denies SI,HI, AH and VH; however pt appears to be responding to internal stimuli      [facility] ACT team at [phone].   Patient's mother  is listed as patient guardian.  Attendees:  Signature: Thedore MinsMojeed Akintayo, MD 08/02/2013 8:19 AM   Signature: Richelle Itood Lucely Leard, LCSW 08/02/2013 8:19 AM  Signature: Fransisca KaufmannLaura Davis, NP 08/02/2013 8:19 AM  Signature: Joslyn Devonaroline Beaudry, RN 08/02/2013 8:19 AM  Signature: Liborio NixonPatrice White, RN 08/02/2013 8:19 AM  Signature:  08/02/2013 8:19 AM  Signature:   08/02/2013 8:19 AM  Signature:    Signature:    Signature:    Signature:    Signature:    Signature:      Scribe for Treatment Team:   Richelle Itood Niaja Stickley, LCSW  08/02/2013 8:19 AM

## 2013-08-02 NOTE — Progress Notes (Signed)
Adult Art Group  Date:  08/02/2013 Time:  9:30am  Group Topic/Focus: Art  Participation Level:  Active  Participation Quality:  Appropriate  Affect:  Appropriate  Activity: Patients asked to create art to coincide with daily unit theme using any combination of markers, crayons, color pencils, construction paper, magazine clippings, glue, and scissors.   Additional Comments:  Pts talked about different aspects of hygiene and how it relates to recovery. Pts used pictures from magazines to identify different parts of hygiene and used them to make a collage.  [name], Angelica C 08/02/2013, 2:49 PM 
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
## 2013-08-02 NOTE — [facility] Group Notes (Signed)
[facility] LCSW Group Therapy  08/02/2013 , 1:13 PM   Type of Therapy:  Group Therapy  Participation Level:  Active  Participation Quality:  Attentive  Affect:  Appropriate  Cognitive:  Alert  Insight:  Improving  Engagement in Therapy:  Engaged  Modes of Intervention:  Discussion, Exploration and Socialization  Summary of Progress/Problems: Today's group focused on the term Diagnosis.  Participants were asked to define the term, and then pronounce whether it is a negative, positive or neutral term.  Angelica [name] was engaged throughout.  Although she denied having a mental health diagnosis, other than a mis-diagnosis of Tourrette's syndrome,  She admitted to being stubborn and refusing to take her medication.  "Sometimes you don't realize you are having problems or symptoms, and family members do know, but you don't want them to tell you, or like them when they tell you.  So you don't take your meds."  I was appreciative of her honesty.  Other group members echoed the same experience.  We used it as a teachable moment to talk about how we sometimes don't make decisions that are in our best interest even though we know better because of how the message is delivered, or who the messenger is.  [name], Rodney B 08/02/2013 , 1:13 PM
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
## 2013-08-02 NOTE — [facility] Group Notes (Signed)
Adult Psychoeducational Group Note  Date:  08/02/2013 Time:  9:44 PM  Group Topic/Focus:  Wrap-Up Group:   The focus of this group is to help patients review their daily goal of treatment and discuss progress on daily workbooks.  Participation Level:  Active  Participation Quality:  Appropriate  Affect:  Appropriate  Cognitive:  Appropriate  Insight: Good  Engagement in Group:  Engaged  Modes of Intervention:  Discussion  Additional Comments:  Angelica [name] stated her day was okay.  She said the groups were good today, they talked about relapse and she really wants to try to stay clean.  She also stated that she met some new people and hopes to become friends with some of them.  Victorino Sparrow A 08/02/2013, 9:44 PM

## 2013-08-03 LAB — HEMOGLOBIN A1C
Hgb A1c MFr Bld: 5.5 % (ref <5.7)
Mean Plasma Glucose: 111 mg/dL (ref <117)

## 2013-08-03 LAB — PROLACTIN: Prolactin: 112.4 ng/mL

## 2013-08-03 LAB — LIPID PANEL
Cholesterol: 197 mg/dL (ref 0–200)
HDL: 49 mg/dL (ref >39)
LDL CALC: 126 mg/dL — AB (ref 0–99)
TRIGLYCERIDES: 109 mg/dL (ref <150)
Total CHOL/HDL Ratio: 4 RATIO
VLDL: 22 mg/dL (ref 0–40)

## 2013-08-03 MED ORDER — LORATADINE 10 MG PO TABS
10.0000 mg | ORAL_TABLET | Freq: Every day | ORAL | Status: DC | PRN
  Administered 2013-08-03 – 2013-08-08 (×6): 10 mg via ORAL
  Filled 2013-08-03 (×6): qty 1

## 2013-08-03 NOTE — Progress Notes (Signed)
Patient ID: Angelica RidgelMaria A [name], female   DOB: 12-30-1980, 33 y.o.   MRN: [id number] D: Patient irritable and demanding. Patient denied homicidal and suicidal thoughts. Patient denied psychosis. A: Patient given support and encouragement. R: Patient taking medications and compliant with treatment plan.

## 2013-08-03 NOTE — Progress Notes (Signed)
D: Patient denies SI/HI and visual hallucinations. The patient is paranoid and delusional on the unit. The patient states that "everyone has HIV" and that she "doesn't need" medication to help her think and "know things." The patient denies any psychosis but is noticed to be delusional and paranoid on the unit. The patient is attending groups on the unit and is participating within the milieu.  A: Patient given emotional support from RN. Patient encouraged to come to staff with concerns and/or questions. Patient's medication routine continued. Patient's orders and plan of care reviewed. MD/NP notified of patient's behavior and her hesitance to take medication.  R: RN was able to help patient comply with medication administration. Patient remains safe. No new orders were given at this time. Will continue to monitor patient q15 minutes for safety.

## 2013-08-03 NOTE — Progress Notes (Signed)
Adult Activity Group Note    Group Topic: Art  Participation Level: Active  Participation Quality: Appropriate and Sharing  Affect:  Appropriate  Activity: Patients asked to create art to coincide with daily unit theme using any combination of markers, crayons, color pencils, construction paper, magazine clippings, glue, and scissors.   Additional Comments: Pt engaged with the activity wrote leaves and a butterfly with summertime activities.
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
## 2013-08-03 NOTE — [facility] Group Notes (Signed)
[facility]  08/03/2013 , 10:29 AM    Type of Therapy:  [facility] [employer] Presentation  Participation Level:  Active  Participation Quality:  Attentive  Affect:  Blunted  Cognitive:  Oriented  Insight:  Limited  Engagement in Therapy:  Engaged  Modes of Intervention:  Discussion, Education and Socialization  Summary of Progress/Problems:  Angelica HuaDavid from [employer] came to present his recovery story and play the guitar.  Was engaged thoughout.  Asked the speaker questions like "what is schizoaffective d/o?" and "why aren't you using a pick?"  [name], [name] 08/03/2013 , 10:29 AM

## 2013-08-03 NOTE — Progress Notes (Signed)
Adult Activity Group Note   Date: 08/03/2013  Time: 2:15pm  Group Topic: Increased Cognitive Functioning   Participation Level: Active   Participation Quality: Appropriate   Affect: Appropriate  Activity: Trivia. Patients will be asked to commonalities on ordinary items that have no appeared direct connection.   Additional Comments: Pt participated and answered many of the trivia questions. She appeared to enjoy group.

## 2013-08-03 NOTE — [facility] Group Notes (Signed)
[facility]   08/03/2013 10:27 AM  Participation Quality:  Engaged  Mood/Affect:  Appropriate  Depression Rating:  denies  Anxiety Rating:  denies  Thoughts of Suicide:  No Will you contract for safety?   NA  Current AVH:  denies  Plan for Discharge/Comments:  [name] is in a good mood.  She has no complaints, and wonders if she will have to be here for the full 10 days as outlined by the Dr yesterday.  I told her she would be with us for sure through the weekend, and would evaluate on Monday AM.  She had no problems with this plan.  Transportation Means: family  Supports: family   Kiribatiorth, Baldo DaubRodney B

## 2013-08-04 DIAGNOSIS — F200 Paranoid schizophrenia: Principal | ICD-10-CM

## 2013-08-04 MED ORDER — DIPHENHYDRAMINE HCL 50 MG/ML IJ SOLN
50.0000 mg | Freq: Once | INTRAMUSCULAR | Status: AC
  Administered 2013-08-05: 50 mg via INTRAMUSCULAR
  Filled 2013-08-04 (×2): qty 1

## 2013-08-04 MED ORDER — PALIPERIDONE PALMITATE 234 MG/1.5ML IM SUSP
234.0000 mg | Freq: Once | INTRAMUSCULAR | Status: DC
  Filled 2013-08-04: qty 1.5

## 2013-08-04 NOTE — Progress Notes (Signed)
Patient ID: Angelica [name], female   DOB: 07/18/1980, 33 y.o.   MRN: [id number] D: Patient reported she had a good day. Pt reports her medication made her feel emotional and crying alot. Pt stated she reported to  and explanation given. Pt reports eating and sleeping well.  Pt thought process is organized and behavior is appropriate. Pt denies suicidal /homicidal ideation intent and plan. Pt denies auditory and visual hallucination. Pt attended evening karaoke group and engaged with peers. Cooperative with assessment. No acute distressed noted at this time.   A: Met with pt 1:1. Medications administered as prescribed. Writer encouraged pt to discuss feelings. Pt encouraged to come to staff with any questions or concerns.   R: Patient is safe on the unit. She is complaint with medications and denies any adverse reaction. Continue current POC.     

## 2013-08-04 NOTE — Progress Notes (Signed)
Patient ID: Angelica [name], female   DOB: 1980/11/26, 33 y.o.   MRN: [id number] D: Took over patient's care @ 2330. Patient in bed sleeping. Respiration regular and unlabored. No sign of distress noted at this time A: 15 mins checks for safety. R: Patient is safe.

## 2013-08-04 NOTE — Progress Notes (Signed)
Pt attended karaoke this evening.  

## 2013-08-04 NOTE — Progress Notes (Signed)
Patient ID: Angelica [name], female   DOB: [birth year], 33 y.o.   MRN: [id number] [facility]  08/04/2013 11:14 AM Angelica [name]  MRN:  [id number] Subjective:   Patient states "I have become more emotional lately, I feel like all my family are dying of HIV and they don't seem to care about it. My family is playing a vicious game with me. Even my ex called me to secretly let me know some information. I have never seen documentation that they have HIV but I saw information appear on some pop up ads on the computer. My family says all this is nonsense. But I do not believe them.''  Objective:  Patient is seen, her chart is reviewed with the member of treatment team. Patient shows no insight as she denies having any mental illness and does not need medications. She is extremely paranoid and delusional. She is fixated on all her family suffering from HIV and her grandmother having terminal cancer. However, her family reports that none of them has been diagnosed with HiV or cancer. Patient is being followed by an ACT Team outside of the hospital but has a long history of medication non-compliance. However, she has tentatively agreed to take [place]vega Sustenna and tablet. She has been compliant with all the unit milieu but very intrusive and needs redirections. Diagnosis:   DSM5: Total Time spent with patient: 20 minutes AXIS I: Paranoid Schizophrenia, chronic condition with acute exacerbation  AXIS II: Deferred  AXIS III:  Past Medical History   Diagnosis  Date   .  No pertinent past medical history    .  Medical history non-contributory    .  Migraine    .  Paranoid schizophrenia     AXIS IV: other psychosocial or environmental problems and problems with primary support group  AXIS V: 31-40 impairment in reality testing   ADL's:  Intact  Sleep: Fair  Appetite:  Fair  Suicidal Ideation:  Denies Homicidal Ideation:  Denies AEB (as evidenced by):  Psychiatric Specialty Exam: Physical  Exam  Review of Systems  Constitutional: Negative.   HENT: Negative.   Eyes: Negative.   Respiratory: Negative.   Cardiovascular: Negative.   Gastrointestinal: Negative.   Genitourinary: Negative.   Musculoskeletal: Negative.   Skin: Negative.   Neurological: Negative.   Endo/Heme/Allergies: Negative.   Psychiatric/Behavioral: Positive for hallucinations. The patient is nervous/anxious and has insomnia.     Blood pressure 100/64, pulse 102, temperature 98.6 F (37 C), temperature source Oral, resp. rate 20, height 5\' 3"  (1.6 m), weight 89.812 kg (198 lb), SpO2 100.00%.Body mass index is 35.08 kg/(m^2).  General Appearance: Disheveled  Eye Contact::  Good  Speech:  Clear and Coherent  Volume:  Increased  Mood:  Irritable  Affect:  Labile  Thought Process:  Circumstantial  Orientation:  Full (Time, Place, and Person)  Thought Content:  Delusions, Hallucinations: Auditory, Paranoid Ideation and Rumination  Suicidal Thoughts:  No  Homicidal Thoughts:  No  Memory:  Immediate;   Good Recent;   Fair Remote;   Fair  Judgement:  Impaired  Insight:  Lacking  Psychomotor Activity:  Normal  Concentration:  Fair  Recall:  FiservFair  Fund of Knowledge:Fair  Language: Fair  Akathisia:  No  Handed:  Right  AIMS (if indicated):     Assets:  Communication Skills Physical Health Resilience Social Support  Sleep:  Number of Hours: 6.25   Musculoskeletal: Strength & Muscle Tone: within normal limits Gait & Station:  normal Patient leans: N/A  Current Medications: Current Facility-Administered Medications  Medication Dose Route Frequency Provider Last Rate Last Dose  . acetaminophen (TYLENOL) tablet 650 mg  650 mg Oral Q6H PRN Nanine Means, NP      . alum & mag hydroxide-simeth (MAALOX/MYLANTA) 200-200-20 MG/5ML suspension 30 mL  30 mL Oral Q4H PRN Nanine Means, NP      . benztropine (COGENTIN) tablet 1 mg  1 mg Oral Daily Mojeed Akintayo   1 mg at 08/04/13 0829  . carbamazepine  (TEGRETOL XR) 12 hr tablet 200 mg  200 mg Oral BID Nanine Means, NP   200 mg at 08/04/13 0829  . diphenhydrAMINE-zinc acetate (BENADRYL) 2-0.1 % cream   Topical BID PRN Kerry Hough, PA-C      . loratadine (CLARITIN) tablet 10 mg  10 mg Oral Daily PRN Fransisca Kaufmann, NP   10 mg at 08/03/13 2054  . magnesium hydroxide (MILK OF MAGNESIA) suspension 30 mL  30 mL Oral Daily PRN Nanine Means, NP      . nicotine (NICODERM CQ - dosed in mg/24 hours) patch 21 mg  21 mg Transdermal Daily Mojeed Akintayo   21 mg at 08/04/13 0831  . OLANZapine zydis (ZYPREXA) disintegrating tablet 10 mg  10 mg Oral Q8H PRN Mojeed Akintayo      . paliperidone (INVEGA) 24 hr tablet 6 mg  6 mg Oral Daily Mojeed Akintayo   6 mg at 08/04/13 0830  . traZODone (DESYREL) tablet 50 mg  50 mg Oral QHS PRN Mojeed Akintayo        Lab Results:  Results for orders placed during the hospital encounter of 08/01/13 (from the past 48 hour(s))  HEMOGLOBIN A1C     Status: None   Collection Time    08/03/13  6:25 AM      Result Value Ref Range   Hemoglobin A1C 5.5  <5.7 %   Comment: (NOTE)                                                                               According to the ADA Clinical Practice Recommendations for 2011, when     HbA1c is used as a screening test:      >=6.5%   Diagnostic of Diabetes Mellitus               (if abnormal result is confirmed)     5.7-6.4%   Increased risk of developing Diabetes Mellitus     References:Diagnosis and Classification of Diabetes Mellitus,Diabetes     Care,2011,34(Suppl 1):S62-S69 and Standards of Medical Care in             Diabetes - 2011,Diabetes Care,2011,34 (Suppl 1):S11-S61.   Mean Plasma Glucose 111  <117 mg/dL   Comment: Performed at [facility]  LIPID PANEL     Status: Abnormal   Collection Time    08/03/13  6:25 AM      Result Value Ref Range   Cholesterol 197  0 - 200 mg/dL   Triglycerides 161  <096 mg/dL   HDL 49  >04 mg/dL   Total CHOL/HDL Ratio 4.0      VLDL 22  0 - 40  mg/dL   LDL Cholesterol 161126 (*) 0 - 99 mg/dL   Comment:            Total Cholesterol/HDL:CHD Risk     Coronary Heart Disease Risk Table                         Men   Women      1/2 Average Risk   3.4   3.3      Average Risk       5.0   4.4      2 X Average Risk   9.6   7.1      3 X Average Risk  23.4   11.0                Use the calculated Patient Ratio     above and the CHD Risk Table     to determine the patient's CHD Risk.                ATP III CLASSIFICATION (LDL):      <100     mg/dL   Optimal      [id number]  mg/dL   Near or Above                        Optimal      130-159  mg/dL   Borderline      [id number]  mg/dL   High      >914>190     mg/dL   Very High     [facility]  PROLACTIN     Status: None   Collection Time    08/03/13  6:25 AM      Result Value Ref Range   Prolactin 112.4     Comment: (NOTE)         Reference Ranges:                     Female:                       2.1 -  17.1 ng/ml                     Female:   Pregnant          9.7 - 208.5 ng/mL                               Non Pregnant      2.8 -  29.2 ng/mL                               Post Menopausal   1.8 -  20.3 ng/mL                           [facility]    Physical Findings: AIMS: Facial and Oral Movements Muscles of Facial Expression: None, normal Lips and Perioral Area: None, normal Jaw: None, normal Tongue: None, normal,Extremity Movements Upper (arms, wrists, hands, fingers): None, normal Lower (legs, knees, ankles, toes): None, normal, Trunk Movements Neck, shoulders, hips: None, normal, Overall Severity Severity of abnormal movements (highest score from questions above): None, normal Incapacitation due to abnormal movements: None, normal Patient's awareness of abnormal  movements (rate only patient's report): No Awareness, Dental Status Current problems with teeth and/or dentures?: No Does patient usually wear dentures?: No  CIWA:     COWS:     Treatment Plan Summary: Daily contact with patient to assess and evaluate symptoms and progress in treatment Medication management  Plan:  1. Continue crisis management and stabilization.  2. Medication management:  -Continue Invega 6 mg daily for psychosis -Continue Cogentin 1 mg daily for EPS prevention -Continue Tegretol XR 200 mg BID for improved mood stability -Initiate Invega sustenna 234mg  IM q28days, Patient will receive 1st dose tomorrow per pharmacy. 3. Encouraged patient to attend groups and participate in group counseling sessions and activities.  4. Discharge plan in progress.  5. Continue current treatment plan.  6. Address health issues: Vitals reviewed and stable.   Medical Decision Making Problem Points:  Established problem, worsening (2), Review of last therapy session (1) and Review of psycho-social stressors (1) Data Points:  Review or order clinical lab tests (1) Review and summation of old records (2) Review of medication regiment & side effects (2) Review of new medications or change in dosage (2)  I certify that inpatient services furnished can reasonably be expected to improve the patient's condition.   Thedore Mins, MD 08/04/2013, 11:14 AM

## 2013-08-04 NOTE — [facility] Group Notes (Signed)
[facility] Group Notes:  (Counselor/Nursing/MHT/Case Management/Adjunct)  08/04/2013 1:15PM  Type of Therapy:  Group Therapy  Participation Level:  Active  Participation Quality:  Appropriate  Affect:  Flat  Cognitive:  Oriented  Insight:  Improving  Engagement in Group:  Limited  Engagement in Therapy:  Limited  Modes of Intervention:  Discussion, Exploration and Socialization  Summary of Progress/Problems: The topic for group was balance in life.  Pt participated in the discussion about when their life was in balance and out of balance and how this feels.  Pt discussed ways to get back in balance and short term goals they can work on to get where they want to be. Angelica [name] was engaged throughout.  She stated she is both balanced and unbalanced,"balanced because I am getting the therapy I need, and unbalanced because I might have to keep taking medication."   She was an active participant, sometimes on topic, sometimes not anywhere related.  She stated that her routine of being a mother to her children is something that makes her feel busy and productive, "but sometimes you just need a daycation.  [place]  The went on a tangent about needing to work so she has more money.   Angelica [name], Rodney B 08/04/2013 2:47 PM

## 2013-08-04 NOTE — Progress Notes (Addendum)
Patient ID: Angelica [name], female   DOB: 05-12-1980, 33 y.o.   MRN: [id number]  Time: 2:15p Group Activity: Activity Group Pt's were given a variety of options, crossword puzzles, paper to draw on, pictures to color.  Pt colored a Hello Kitty sheet  Affect: bright and cooperative.

## 2013-08-04 NOTE — Progress Notes (Signed)
Adult Activity Group Note   Group Topic: Art  Participation Level: Active  Participation Quality: Appropriate, Sharing and Supportive  Affect:  Appropriate  Activity: Patients asked to create art to coincide with daily unit theme using any combination of markers, crayons, color pencils, construction paper, magazine clippings, glue, and scissors.   Additional Comments: Pt was an active participant in group. She described liking her natural hair and her sense of humor.

## 2013-08-04 NOTE — [facility] Group Notes (Signed)
[facility] Group Notes:  (Nursing/MHT/Case Management/Adjunct)  Date:  08/04/2013  Time:  0900  Type of Therapy:  Psychoeducational Skills  Participation Level:  Active  Participation Quality:  Appropriate  Affect:  Appropriate  Cognitive:  Appropriate  Insight:  Appropriate  Engagement in Group:  Engaged  Modes of Intervention:  Clarification and Problem-solving  Summary of Progress/Problems: Participated in group; working in workbook  Andres Egeritchett, Jennifer Hundley 08/04/2013, 1:06 PM

## 2013-08-04 NOTE — Progress Notes (Signed)
Patient ID: Angelica RidgelMaria A [name], female   DOB: 22-Dec-1980, 33 y.o.   MRN: [id number]  D: Patient pleasant on approach this am. Currently denies any depression or feelings of hopelessness at present. Placing "1" in those columns on self inventory sheet. Currently denies any SI or HI. Denies hallucinations also. Patient still reporting beliefs about family having HIV when speaking to treatment team. Does not believe that she is having any problems mentally. A: Staff will monitor on q 15 minute checks, follow treatment plan, and give medications as ordered. R: Cooperative on the unit. Took medication without any issue today

## 2013-08-05 MED ORDER — PALIPERIDONE PALMITATE 234 MG/1.5ML IM SUSP
234.0000 mg | Freq: Once | INTRAMUSCULAR | Status: AC
  Administered 2013-08-05: 234 mg via INTRAMUSCULAR

## 2013-08-05 NOTE — Progress Notes (Signed)
[facility] Group Notes:  (Nursing/MHT/Case Management/Adjunct)  Date:  08/05/2013  Time:  11:52 PM  Type of Therapy:  Psychoeducational Skills  Participation Level:  Active  Participation Quality:  Appropriate  Affect:  Flat  Cognitive:  Appropriate  Insight:  Appropriate  Engagement in Group:  Developing/Improving  Modes of Intervention:  Exploration  Summary of Progress/Problems: The patient described her day as having been "okay". The patient shared in group that her medication made her feel more "emotional". She attended her groups and is working on keeping track of her medications while in the hospital. In terms of the theme for the day, her relapse prevention will include the following: eliminating cigarettes from her life, and continuing to take [place].   Hazle CocaGOODMAN, Florice Hindle S 08/05/2013, 11:52 PM

## 2013-08-05 NOTE — [facility] Suicide Risk Assessment (Signed)
[facility] INPATIENT:  Family/Significant Other Suicide Prevention Education  Suicide Prevention Education:  Education Completed; No one has been identified by the patient as the family member/significant other with whom the patient will be residing, and identified as the person(s) who will aid the patient in the event of a mental health crisis (suicidal ideations/suicide attempt).  With written consent from the patient, the family member/significant other has been provided the following suicide prevention education, prior to the and/or following the discharge of the patient.  The suicide prevention education provided includes the following:  Suicide risk factors  Suicide prevention and interventions  National Suicide Hotline telephone number  [facility] assessment telephone number  [place] Emergency Assistance 911  [facility] and/or Residential Mobile Crisis Unit telephone number  Request made of family/significant other to:  Remove weapons (e.g., guns, rifles, knives), all items previously/currently identified as safety concern.    Remove drugs/medications (over-the-counter, prescriptions, illicit drugs), all items previously/currently identified as a safety concern.  The family member/significant other verbalizes understanding of the suicide prevention education information provided.  The family member/significant other agrees to remove the items of safety concern listed above. The patient did not endorse SI at the time of admission, nor did the patient c/o SI during the stay here.  SPE not required.    [name], Rodney B 08/05/2013, 12:25 PM

## 2013-08-05 NOTE — [facility] Group Notes (Signed)
[facility] LCSW Group Therapy  08/05/2013 2:33 PM   Type of Therapy:  Group Therapy  Participation Level:  Active  Participation Quality:  Attentive  Affect:  Appropriate  Cognitive:  Appropriate  Insight:  Improving  Engagement in Therapy:  Engaged  Modes of Intervention:  Clarification, Education, Exploration and Socialization  Summary of Progress/Problems: Today's group focused on relapse prevention.  We defined the term, and then brainstormed on ways to prevent relapse.  [name] stayed for about half the group.  While attending, she was engaged and commented many times.  Most were vaguely related to the topic, and were so abstract that it was difficult to relate them to the subject.  But she was content to make her statement and then defer to others, so she needed only a minimum amount of redirection.  [name], Adonis Yim B 08/05/2013 , 2:33 PM

## 2013-08-05 NOTE — Progress Notes (Signed)
Patient ID: Angelica RidgelMaria A Fronczak, female   DOB: Jun 22, 1980, 33 y.o.   MRN: [id number] D: Patient up in the milieu to receive medications.  She appears resistant to taking her medications asking, "do I have keep taking this invega?  Do I have to keep taking this tegretol?  I'm supposed to be getting an injection today.  I don't understand why I have to take all this medication with it!"  Patient is due to receive an invega sustaina injection today.  Patient presents with flat affect, irritable mood.  Her mother/guardian called to review her list of medications.  Patient is very concerned about her discharge date.  She denies any SI/HI/AVH.  A: Continue to monitor medication management and MD orders.  Safety checks completed every 15 minutes per protocol.  R: Patient resistant to treatment; she is cooperative.

## 2013-08-05 NOTE — Progress Notes (Signed)
Patient ID: Angelica RidgelMaria A Mundo, female   DOB: 1980-11-23, 33 y.o.   MRN: [id number]    [facility] Note  08/05/2013 11:39 AM Angelica RidgelMaria A Madej  MRN:  [id number] Subjective:   Patient states "I think I have been here too long. I don't need to be away from my children. I have been kind of short winded. Do you think it is from the medications? I have not spoke with my family. I just need to focus on me. I family is just bad people."   Objective:  Patient is seen and her chart is reviewed. Patient shows no insight as she denies having any mental illness and does not need medications. She continues to express delusions that her family all have HIV.  She is extremely paranoid and delusional. She continues to be hesitant about taking medications and seems reluctant to do so. Patient is focused on when she should be discharged. She argues about having already been in the hospital long enough.  Patient is being followed by an ACT Team outside of the hospital but has a long history of medication non-compliance. However, she has agreed to take [place]Invega Sustenna and tablet. She has been compliant with all the unit milieu but very intrusive and needs redirection. Patient is taking medications at this time but must be watched closely for attempting to cheek them.   Diagnosis:   DSM5: Total Time spent with patient: 20 minutes AXIS I: Paranoid Schizophrenia, chronic condition with acute exacerbation  AXIS II: Deferred  AXIS III:  Past Medical History   Diagnosis  Date   .  No pertinent past medical history    .  Medical history non-contributory    .  Migraine    .  Paranoid schizophrenia     AXIS IV: other psychosocial or environmental problems and problems with primary support group  AXIS V: 41-50  ADL's:  Intact  Sleep: Fair  Appetite:  Fair  Suicidal Ideation:  Denies Homicidal Ideation:  Denies AEB (as evidenced by):  Psychiatric Specialty Exam: Physical Exam  Review of Systems  Constitutional:  Negative.   HENT: Negative.   Eyes: Negative.   Respiratory: Negative.   Cardiovascular: Negative.   Gastrointestinal: Negative.   Genitourinary: Negative.   Musculoskeletal: Negative.   Skin: Negative.   Neurological: Negative.   Endo/Heme/Allergies: Negative.   Psychiatric/Behavioral: Positive for hallucinations. The patient is nervous/anxious and has insomnia.     Blood pressure 94/61, pulse 87, temperature 98.6 F (37 C), temperature source Oral, resp. rate 18, height 5\' 3"  (1.6 m), weight 89.812 kg (198 lb), SpO2 100.00%.Body mass index is 35.08 kg/(m^2).  General Appearance: Casual  Eye Contact::  Good  Speech:  Clear and Coherent  Volume:  Normal  Mood:  Irritable  Affect:  Labile  Thought Process:  Circumstantial  Orientation:  Full (Time, Place, and Person)  Thought Content:  Delusions, Hallucinations: Auditory, Paranoid Ideation and Rumination  Suicidal Thoughts:  No  Homicidal Thoughts:  No  Memory:  Immediate;   Good Recent;   Fair Remote;   Fair  Judgement:  Impaired  Insight:  Lacking  Psychomotor Activity:  Normal  Concentration:  Fair  Recall:  [employer]Fair  Fund of Knowledge:Fair  Language: Fair  Akathisia:  No  Handed:  Right  AIMS (if indicated):     Assets:  Communication Skills Physical Health Resilience Social Support  Sleep:  Number of Hours: 6.75   Musculoskeletal: Strength & Muscle Tone: within normal limits Gait & Station:  normal Patient leans: N/A  Current Medications: Current Facility-Administered Medications  Medication Dose Route Frequency Provider Last Rate Last Dose  . acetaminophen (TYLENOL) tablet 650 mg  650 mg Oral Q6H PRN Nanine MeansJamison Lord, NP   650 mg at 08/04/13 1146  . alum & mag hydroxide-simeth (MAALOX/MYLANTA) 200-200-20 MG/5ML suspension 30 mL  30 mL Oral Q4H PRN Nanine MeansJamison Lord, NP      . benztropine (COGENTIN) tablet 1 mg  1 mg Oral Daily Mojeed Akintayo   1 mg at 08/05/13 0750  . carbamazepine (TEGRETOL XR) 12 hr tablet 200 mg   200 mg Oral BID Nanine MeansJamison Lord, NP   200 mg at 08/05/13 0750  . diphenhydrAMINE (BENADRYL) injection 50 mg  50 mg Intramuscular Once Mojeed Akintayo      . diphenhydrAMINE-zinc acetate (BENADRYL) 2-0.1 % cream   Topical BID PRN Kerry HoughSpencer E Simon, PA-C      . loratadine (CLARITIN) tablet 10 mg  10 mg Oral Daily PRN Fransisca KaufmannLaura Davis, NP   10 mg at 08/04/13 2142  . magnesium hydroxide (MILK OF MAGNESIA) suspension 30 mL  30 mL Oral Daily PRN Nanine MeansJamison Lord, NP      . nicotine (NICODERM CQ - dosed in mg/24 hours) patch 21 mg  21 mg Transdermal Daily Mojeed Akintayo   21 mg at 08/05/13 0750  . OLANZapine zydis (ZYPREXA) disintegrating tablet 10 mg  10 mg Oral Q8H PRN Mojeed Akintayo      . paliperidone (INVEGA) 24 hr tablet 6 mg  6 mg Oral Daily Mojeed Akintayo   6 mg at 08/05/13 0750  . Paliperidone Palmitate SUSP 234 mg  234 mg Intramuscular Once Mojeed Akintayo      . traZODone (DESYREL) tablet 50 mg  50 mg Oral QHS PRN Mojeed Akintayo        Lab Results:  No results found for this or any previous visit (from the past 48 hour(s)).  Physical Findings: AIMS: Facial and Oral Movements Muscles of Facial Expression: None, normal Lips and Perioral Area: None, normal Jaw: None, normal Tongue: None, normal,Extremity Movements Upper (arms, wrists, hands, fingers): None, normal Lower (legs, knees, ankles, toes): None, normal, Trunk Movements Neck, shoulders, hips: None, normal, Overall Severity Severity of abnormal movements (highest score from questions above): None, normal Incapacitation due to abnormal movements: None, normal Patient's awareness of abnormal movements (rate only patient's report): No Awareness, Dental Status Current problems with teeth and/or dentures?: No Does patient usually wear dentures?: No  CIWA:    COWS:     Treatment Plan Summary: Daily contact with patient to assess and evaluate symptoms and progress in treatment Medication management  Plan:  1. Continue crisis management  and stabilization.  2. Medication management:  -Continue Invega 6 mg daily for psychosis -Continue Cogentin 1 mg daily for EPS prevention -Continue Tegretol XR 200 mg BID for improved mood stability -Continue Invega sustenna 234mg  IM q28days, Patient will receive first dose today with one time dose of Benadryl 50 mg IM.  3. Encouraged patient to attend groups and participate in group counseling sessions and activities.  4. Discharge plan in progress.  5. Continue current treatment plan.  6. Address health issues: Vitals reviewed and stable.   Medical Decision Making Problem Points:  Established problem, worsening (2), Review of last therapy session (1) and Review of psycho-social stressors (1) Data Points:  Review or order clinical lab tests (1) Review of medication regiment & side effects (2) Review of new medications or change in dosage (2)  I  certify that inpatient services furnished can reasonably be expected to improve the patient's condition.   Fransisca Kaufmann, NP-C 08/05/2013, 11:39 AM  Patient seen, evaluated and I agree with notes by Nurse Practitioner. Thedore Mins, MD

## 2013-08-05 NOTE — [facility] Group Notes (Signed)
[facility]   08/05/2013 10:29 AM  Participation Quality:  Engaged  Mood/Affect:  Appropriate  Depression Rating:  1  Anxiety Rating:  1  Thoughts of Suicide:  No Will you contract for safety?   NA  Current AVH:  Denies  Plan for Discharge/Comments:  "I'm always in a good mood, or I try to be."  [name] attended [employer] last night.  She tells us she tried to sing, but ran out of breath.  She pressed me on when she would be d/ced.  I informed her not until after the weekend.  She says she has court on Monday, but then reveals it is related to IVC, so I explained that to her.  Transportation Means: family  Supports: family  Kiribatiorth, Baldo DaubRodney B

## 2013-08-05 NOTE — Tx Team (Signed)
  Interdisciplinary Treatment Plan Update   Date Reviewed:  08/05/2013  Time Reviewed:  3:19 PM  Progress in Treatment:   Attending groups: Yes Participating in groups: Yes Taking medication as prescribed: Yes  Tolerating medication: Yes Family/Significant other contact made: Yes  Patient understands diagnosis: Yes  Discussing patient identified problems/goals with staff: Yes Medical problems stabilized or resolved: Yes Denies suicidal/homicidal ideation: Yes Patient has not harmed self or others: Yes  For review of initial/current patient goals, please see plan of care.  Estimated Length of Stay:  4-5 days  Reason for Continuation of Hospitalization: Hallucinations Medication stabilization  New Problems/Goals identified:  N/A  Discharge Plan or Barriers:   return home, [facility]  Additional Comments:  Patient states "I think I have been here too long. I don't need to be away from my children. I have been kind of short winded. Do you think it is from the medications? I have not spoke with my family. I just need to focus on me. I family is just bad people."  Patient is seen and her chart is reviewed. Patient shows no insight as she denies having any mental illness and does not need medications. She continues to express delusions that her family all have HIV.   Attendees:  Signature: Thedore MinsMojeed Akintayo, MD 08/05/2013 3:19 PM   Signature: Richelle Itood North, LCSW 08/05/2013 3:19 PM  Signature: Fransisca KaufmannLaura Davis, NP 08/05/2013 3:19 PM  Signature: Joslyn Devonaroline Beaudry, RN 08/05/2013 3:19 PM  Signature: Liborio NixonPatrice White, RN 08/05/2013 3:19 PM  Signature:  08/05/2013 3:19 PM  Signature:   08/05/2013 3:19 PM  Signature:    Signature:    Signature:    Signature:    Signature:    Signature:      Scribe for Treatment Team:   Richelle Itood North, LCSW  08/05/2013 3:19 PM

## 2013-08-06 NOTE — Progress Notes (Signed)
Patient ID: Angelica [name], female   DOB: 26-Dec-1980, 33 y.o.   MRN: [id number] Psychoeducational Group Note  Date:  08/06/2013 Time:0930am  Group Topic/Focus:  Identifying Needs:   The focus of this group is to help patients identify their personal needs that have been historically problematic and identify healthy behaviors to address their needs.  Participation Level:  Active  Participation Quality:  Appropriate  Affect:  Anxious  Cognitive:  Appropriate  Insight:  Supportive  Engagement in Group:  Supportive  Additional Comments:  Healthy support systems.   Valente DavidWeaver, Stephen Brooks 08/06/2013,10:27 AM

## 2013-08-06 NOTE — Progress Notes (Signed)
[facility] Group Notes:  (Nursing/MHT/Case Management/Adjunct)  Date:  08/06/2013  Time:  9:38 PM  Type of Therapy:  Psychoeducational Skills  Participation Level:  Active  Participation Quality:  Attentive  Affect:  Depressed  Cognitive:  Appropriate  Insight:  Improving  Engagement in Group:  Improving  Modes of Intervention:  Education  Summary of Progress/Problems: The patient was more conversational in group this evening as compared to last night. She shared in group that she felt well enough to be discharged since she attended all of her groups, spoke with her children over the phone, and was eager to be reunited with her family. She also mentioned that she received her injection the other day and intends to continue to take her Invega. In terms of the theme for the day, her support system will be comprised of the Act Team. She verbalized that she isn't always happy to see the ACT Team at her house since they don't contact her in advance and set up an appointment.   Hazle CocaGOODMAN, Darreon Lutes S 08/06/2013, 9:38 PM

## 2013-08-06 NOTE — Progress Notes (Signed)
D   Pt is cooperative and pleasant   She is active in groups and socializes well with other pts   She talks about her children and is hopeful for discharge soon so she can see them   She requested claritin for her sinus congestion and believes it may help her sleep as well   Her thoughts are still somewhat disorganized but she reports much clearer since hospitalization A    Verbal support given   Medications administered and effectiveness monitored   Q 15 min checks R    Pt safe at present

## 2013-08-06 NOTE — [facility] Group Notes (Signed)
[facility] Group Notes:  (Clinical Social Work)  08/06/2013  11:00-11:45AM  Summary of Progress/Problems:   The main focus of today's process group was for the patient to identify ways in which they have in the past sabotaged their own recovery and reasons they may have done this/what they received from doing it.  We then worked to identify a specific plan to avoid doing this when discharged from the hospital for this admission.  The patient expressed that she needs to change her diet to avoid future hospitalizations.  When CSW asked for clarification on how this would assist her, she started talking about needing coping skills to deal with everyday things.  She stated that she is easily provoked.  She started to make more tangential statements after this, including talking about glue in her shampoo, and a variety of unrelated comments.  She kept laughing inappropriately.  She blurted out "I forgot to die" at one point, then laughed.  Type of Therapy:  Group Therapy - Process  Participation Level:  Active  Participation Quality:  Intrusive and Monopolizing  Affect:  Defensive and Flat  Cognitive:  Confused  Insight:  Limited  Engagement in Therapy:  Limited  Modes of Intervention:  Clarification, Education, Exploration, Discussion  Ambrose MantleMareida Grossman-Orr, LCSW 08/06/2013, 12:19 PM

## 2013-08-06 NOTE — Progress Notes (Signed)
Patient ID: Angelica [name] Garden, female   DOB: 23-Nov-1980, 33 y.o.   MRN: [id number]   [facility]  08/06/2013 2:50 PM Angelica [name] Angelica [name]  MRN:  [id number] Subjective:   Patient states "I am doing well with my new medications. I just don't understand why I need to be here. I think I have been away from my children long enough. I am devoted to being [name] good mother.  I did not do anything wrong. I'm [name] good person."   Objective:  Patient is seen and her chart is reviewed. She is active on the unit and is attending the scheduled groups. Patient is compliant with medications. She admits to feeling very anxious today. Patient asking writer intently about when her discharge date is. Angelica [name] becomes very argumentative about this topic insisting that she has already been here "Almost ten days." She continues to have no insight into her mental illness. Notes from group indicate that her thought processes can still be very disorganized with unrelated comments to the topic at hand. She made comments today about having glue in her shampoo and the importance of changing diet to avoid future hospital admissions. Angelica [name] reports speaking with her mother today on the phone. Patient avoids questions regarding their status in terms of events that led to hospital admission. She continues to believe that her family are [name] threat to her physical health.   Diagnosis:   DSM5: Total Time spent with patient: 20 minutes AXIS I: Paranoid Schizophrenia, chronic condition with acute exacerbation  AXIS II: Deferred  AXIS III:  Past Medical History   Diagnosis  Date   .  No pertinent past medical history    .  Medical history non-contributory    .  Migraine    .  Paranoid schizophrenia     AXIS IV: other psychosocial or environmental problems and problems with primary support group  AXIS V: 41-50  ADL's:  Intact  Sleep: Fair  Appetite:  Fair  Suicidal Ideation:  Denies Homicidal Ideation:  Denies AEB (as evidenced  by):  Psychiatric Specialty Exam: Physical Exam  Review of Systems  Constitutional: Negative.   HENT: Negative.   Eyes: Negative.   Respiratory: Negative.   Cardiovascular: Negative.   Gastrointestinal: Negative.   Genitourinary: Negative.   Musculoskeletal: Negative.   Skin: Negative.   Neurological: Negative.   Endo/Heme/Allergies: Negative.   Psychiatric/Behavioral: Positive for hallucinations. The patient is nervous/anxious and has insomnia.     Blood pressure 119/78, pulse 102, temperature 97 F (36.1 C), temperature source Oral, resp. rate 16, height 5\' 3"  (1.6 m), weight 89.812 kg (198 lb), SpO2 100.00%.Body mass index is 35.08 kg/(m^2).  General Appearance: Casual  Eye Contact::  Good  Speech:  Clear and Coherent  Volume:  Normal  Mood:  Anxious and Irritable  Affect:  Labile  Thought Process:  Circumstantial and Disorganized  Orientation:  Full (Time, Place, and Person)  Thought Content:  Delusions, Paranoid Ideation and Rumination  Suicidal Thoughts:  No  Homicidal Thoughts:  No  Memory:  Immediate;   Good Recent;   Fair Remote;   Fair  Judgement:  Impaired  Insight:  Lacking  Psychomotor Activity:  Normal  Concentration:  Fair  Recall:  [employer]Fair  Fund of Knowledge:Fair  Language: Fair  Akathisia:  No  Handed:  Right  AIMS (if indicated):     Assets:  Communication Skills Physical Health Resilience Social Support  Sleep:  Number of Hours: 6.5   Musculoskeletal: Strength &  Muscle Tone: within normal limits Gait & Station: normal Patient leans: N/[name]  Current Medications: Current Facility-Administered Medications  Medication Dose Route Frequency Provider Last Rate Last Dose  . acetaminophen (TYLENOL) tablet 650 mg  650 mg Oral Q6H PRN Nanine MeansJamison Lord, NP   650 mg at 08/05/13 1203  . alum & mag hydroxide-simeth (MAALOX/MYLANTA) 200-200-20 MG/5ML suspension 30 mL  30 mL Oral Q4H PRN Nanine MeansJamison Lord, NP   30 mL at 08/06/13 1118  . benztropine (COGENTIN) tablet 1  mg  1 mg Oral Daily Mojeed Akintayo   1 mg at 08/06/13 0759  . carbamazepine (TEGRETOL XR) 12 hr tablet 200 mg  200 mg Oral BID Nanine MeansJamison Lord, NP   200 mg at 08/06/13 0758  . diphenhydrAMINE-zinc acetate (BENADRYL) 2-0.1 % cream   Topical BID PRN Kerry HoughSpencer E Simon, PA-C      . loratadine (CLARITIN) tablet 10 mg  10 mg Oral Daily PRN Fransisca KaufmannLaura Davis, NP   10 mg at 08/05/13 2130  . magnesium hydroxide (MILK OF MAGNESIA) suspension 30 mL  30 mL Oral Daily PRN Nanine MeansJamison Lord, NP      . nicotine (NICODERM CQ - dosed in mg/24 hours) patch 21 mg  21 mg Transdermal Daily Mojeed Akintayo   21 mg at 08/06/13 0758  . OLANZapine zydis (ZYPREXA) disintegrating tablet 10 mg  10 mg Oral Q8H PRN Mojeed Akintayo      . paliperidone (INVEGA) 24 hr tablet 6 mg  6 mg Oral Daily Mojeed Akintayo   6 mg at 08/06/13 0759  . traZODone (DESYREL) tablet 50 mg  50 mg Oral QHS PRN Mojeed Akintayo        Lab Results:  No results found for this or any previous visit (from the past 48 hour(s)).  Physical Findings: AIMS: Facial and Oral Movements Muscles of Facial Expression: None, normal Lips and Perioral Area: None, normal Jaw: None, normal Tongue: None, normal,Extremity Movements Upper (arms, wrists, hands, fingers): None, normal Lower (legs, knees, ankles, toes): None, normal, Trunk Movements Neck, shoulders, hips: None, normal, Overall Severity Severity of abnormal movements (highest score from questions above): None, normal Incapacitation due to abnormal movements: None, normal Patient's awareness of abnormal movements (rate only patient's report): No Awareness, Dental Status Current problems with teeth and/or dentures?: No Does patient usually wear dentures?: No  CIWA:    COWS:     Treatment Plan Summary: Daily contact with patient to assess and evaluate symptoms and progress in treatment Medication management  Plan:  1. Continue crisis management and stabilization.  2. Medication management:  -Continue Invega 6  mg daily for psychosis -Continue Cogentin 1 mg daily for EPS prevention -Continue Tegretol XR 200 mg BID for improved mood stability -Continue Invega sustenna 234mg  IM q28days with first dose received yesterday.  3. Encouraged patient to attend groups and participate in group counseling sessions and activities.  4. Discharge plan in progress.  5. Continue current treatment plan.  6. Address health issues: Vitals reviewed and stable.   Medical Decision Making Problem Points:  Established problem, stable/improving (1), Review of last therapy session (1) and Review of psycho-social stressors (1) Data Points:  Review or order clinical lab tests (1) Review of medication regiment & side effects (2)  I certify that inpatient services furnished can reasonably be expected to improve the patient's condition.   Fransisca KaufmannDAVIS, LAURA, NP-C 08/06/2013, 2:50 PM  Reviewed the information documented and agree with the treatment plan.  JONNALAGADDA,JANARDHAHA R. 08/07/2013 12:25 PM

## 2013-08-06 NOTE — Progress Notes (Signed)
Patient ID: Angelica [name], female   DOB: 1980-04-10, 33 y.o.   MRN: [id number] D. The patient has a blunted mod and affect. Her thoughts are disorganized.Interacting appropriately in the milieu. Stated that her medication is making her a little emotional but thinks she is getting use to the medication. As part of her plan to prevent relapse she stated that she plans to continue taking her Invega shots and quit smoking.  A. Met with patient to assess. Encouraged to attend evening wrap up group. Requested and received medication for her allergies. R. The patient denies any a/v hallucinations. Compliant with medication.

## 2013-08-06 NOTE — Progress Notes (Signed)
Patient ID: Angelica [name], female   DOB: 11-Oct-1980, 33 y.o.   MRN: [id number] Psychoeducational Group Note  Date:  08/06/2013 Time:0910am  Group Topic/Focus:  Identifying Needs:   The focus of this group is to help patients identify their personal needs that have been historically problematic and identify healthy behaviors to address their needs.  Participation Level:  Active  Participation Quality:  Appropriate  Affect:  Anxious  Cognitive:  Appropriate  Insight:  Supportive  Engagement in Group:  Supportive  Additional Comments:  Inventory group   Valente DavidWeaver, Stephen Brooks 08/06/2013,10:26 AM

## 2013-08-06 NOTE — Progress Notes (Signed)
Patient ID: Angelica RidgelMaria A Poser, female   DOB: 1980/09/11, 33 y.o.   MRN: [id number] 08-06-13 nursing shift note: D. Pt continues to be  blunted. Her thoughts are disorganized, but she is interacting appropriately on the unit. She is taking her medication and going to groups.  She is taking her medications without any adverse effects.  A.did 1:1 with patient to assess this patient. Encouraged to attend her groups. She has not had any prn as of yet today.  R. She denies any a/v hallucination and si/hi. On her inventory sheet she wrote: sleep fair, appetite good, energy normal, attention improving with her depression at 1 and hopelessness at 0. No pain at this time. After discharge she plans to: " be a better mom hopefully, a brand new person and in good spirits with medication". RN will monitor and Q 15 min ck's continue.

## 2013-08-07 NOTE — [facility] Group Notes (Signed)
[facility] Group Notes:  (Clinical Social Work)  08/07/2013   11:15am-12:00pm  Summary of Progress/Problems:  The main focus of today's process group was to listen to a variety of genres of music and to identify that different types of music provoke different responses.  The patient then was able to identify personally what was soothing for them, as well as energizing.  Handouts were used to record feelings evoked, as well as how patient can personally use this knowledge in sleep habits, with depression, and with other symptoms.  The patient expressed understanding of concepts, as well as knowledge of how each type of music affected him/her and how this can be used at home as a wellness/recovery tool.  She was happy and jovial throughout.  Type of Therapy:  Music Therapy   Participation Level:  Active   Participation Quality:  Attentive and Sharing  Affect:  Blunted  Cognitive:  Oriented  Insight:  Engaged  Engagement in Therapy:  Engaged  Modes of Intervention:   Activity, Exploration  Ambrose MantleMareida Grossman-Orr, LCSW 08/07/2013, 12:30pm

## 2013-08-07 NOTE — Progress Notes (Signed)
Patient ID: Angelica RidgelMaria A [name], female   DOB: 05-25-1980, 33 y.o.   MRN: [id number] Psychoeducational Group Note  Date:  08/07/2013 Time:  0930am  Group Topic/Focus:  Making Healthy Choices:   The focus of this group is to help patients identify negative/unhealthy choices they were using prior to admission and identify positive/healthier coping strategies to replace them upon discharge.  Participation Level:  Active  Participation Quality:  Appropriate  Affect:  Labile  Cognitive:  Appropriate  Insight:  Supportive  Engagement in Group:  Supportive  Additional Comments:  Healthy support systems.   Valente DavidWeaver, Stephen Brooks 08/07/2013,10:50 AM

## 2013-08-07 NOTE — Progress Notes (Signed)
[facility] Group Notes:  (Nursing/MHT/Case Management/Adjunct)  Date:  08/07/2013  Time:  9:14 PM  Type of Therapy:  Psychoeducational Skills  Participation Level:  Active  Participation Quality:  Appropriate  Affect:  Labile  Cognitive:  Lacking  Insight:  Limited  Engagement in Group:  Improving  Modes of Intervention:  Education  Summary of Progress/Problems:  The patient expressed in group that she had a pretty good day overall since she learned about some new music in group this morning. Her goal for tomorrow is to work on her admission to the hospital.   Westly PamGOODMAN, Lenora Gomes S 08/07/2013, 9:14 PM
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
## 2013-08-07 NOTE — Progress Notes (Signed)
Patient ID: Angelica [name], female   DOB: Aug 11, 1980, 33 y.o.   MRN: [id number] Psychoeducational Group Note  Date:  08/07/2013 Time:  0910am  Group Topic/Focus:  Making Healthy Choices:   The focus of this group is to help patients identify negative/unhealthy choices they were using prior to admission and identify positive/healthier coping strategies to replace them upon discharge.  Participation Level:  Active  Participation Quality:  Appropriate  Affect:  Labile  Cognitive:  Appropriate  Insight:  Supportive  Engagement in Group:  Supportive  Additional Comments:  Inventory group   Valente DavidWeaver, Angelica [name] 08/07/2013,10:47 AM
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
## 2013-08-08 NOTE — Progress Notes (Signed)
Patient ID: Angelica [name], female   DOB: 1980-10-24, 33 y.o.   MRN: [id number] D: Patient met with treatment team this morning.  She was agreeable to discharge tomorrow.  Patient is still concerned about her family issues.  Her goal is to "try to stop the tension in my home."  She is eating and sleeping well.  She is still having difficulty understanding why she needs to take po meds now that she had the invega injection.  Patient was informed that the medication had to build up in her system.  She denies any SI/HI/AVH; she denies any depressive symptoms.  She is excited about discharge tomorrow.  A: Continue to monitor medication management and MD orders.  Safety checks completed every 15 minutes per protocol. R: Patient is receptive to staff; her behavior is appropriate to situation.

## 2013-08-08 NOTE — Progress Notes (Signed)
[facility] :  Will you be returning to the same living situation after discharge: Yes,  home At discharge, do you have transportation home?:Yes,  Either family or ACT team Do you have the ability to pay for your medications:Yes,  MCD  Release of information consent forms completed and in the chart;  Patient's signature needed at discharge.  Patient to Follow up at: Follow-up Information   [facility] Cordella Register(Gina Davidson said if you do not need a ride home, they will see you on Wednesday, the 24th.)    Contact information:   [address]  [place]  [phone]      Patient denies SI/HI:   Yes,  yes    Safety Planning and Suicide Prevention discussed:  Yes,  yes  Ida Rogueorth, Rodney B 08/08/2013, 4:10 PM

## 2013-08-08 NOTE — [facility] Group Notes (Signed)
[facility]   08/08/2013 10:39 AM  Participation Quality:  Engaged  Mood/Affect:  Appropriate  Depression Rating:  denies  Anxiety Rating:  denies  Thoughts of Suicide:  No Will you contract for safety?   NA  Current AVH:  Denies  Plan for Discharge/Comments:  Gave me her best argument for release today.  Accepted the news that it would be tomorrow.  "Please don't call my family to let them know I am coming.  Can I contact you via e-mail to let you know how I am doing?"    Transportation Means: family  Supports: family/ACT team  [place], [place]

## 2013-08-08 NOTE — Tx Team (Signed)
  Interdisciplinary Treatment Plan Update   Date Reviewed:  08/08/2013  Time Reviewed:  4:00 PM  Progress in Treatment:   Attending groups: Yes Participating in groups: Yes Taking medication as prescribed: Yes  Tolerating medication: Yes Family/Significant other contact made: Yes  Patient understands diagnosis: Yes  Discussing patient identified problems/goals with staff: Yes Medical problems stabilized or resolved: Yes Denies suicidal/homicidal ideation: Yes Patient has not harmed self or others: Yes  For review of initial/current patient goals, please see plan of care.  Estimated Length of Stay:  D/C Tuesday  Reason for Continuation of Hospitalization:   New Problems/Goals identified:  N/A  Discharge Plan or Barriers:   return home, follow up with ACT team  Additional Comments:  Attendees:  Signature: Thedore MinsMojeed Akintayo, MD 08/08/2013 4:00 PM   Signature: Richelle Itood Kourtni Stineman, LCSW 08/08/2013 4:00 PM  Signature: Fransisca KaufmannLaura Davis, NP 08/08/2013 4:00 PM  Signature: Joslyn Devonaroline Beaudry, RN 08/08/2013 4:00 PM  Signature: Liborio NixonPatrice White, RN 08/08/2013 4:00 PM  Signature:  08/08/2013 4:00 PM  Signature:   08/08/2013 4:00 PM  Signature:    Signature:    Signature:    Signature:    Signature:    Signature:      Scribe for Treatment Team:   Richelle Itood Gaby Harney, LCSW  08/08/2013 4:00 PM

## 2013-08-08 NOTE — Progress Notes (Signed)
Pt requested claritin and zyprexa.

## 2013-08-08 NOTE — Progress Notes (Signed)
Patient ID: Angelica [name], female   DOB: 1980-12-31, 33 y.o.   MRN: [id number] D: Pt. Visible on the unit, reports "doing fine, going home tomorrow at twelve" "want to be sent home with some of those nicotine patches"  Pt. Denies AVH, SHI. A: Writer provided emotional support, reviewed medication schedule. Staff will monitor q7515min for safety, group encouraged. R: Pt. Is safe on the unit, attended group.

## 2013-08-08 NOTE — Progress Notes (Signed)
The focus of this group is to help patients review their daily goal of treatment and discuss progress on daily workbooks. Pt attended the evening group session and responded to all discussion prompts from the Writer. Pt shared that she had a good day on the unit, the highlight of which were her groups. Pt's only additional request from staff this evening was for towels and gowns, which were given to her following group. Pt shared that upon discharge she planned to stop smoking as a way to improve her wellness. "I really want to, but I live in a house full of old smokers, so it won't be easy." Pt stated she hoped to be discharged with patches. Pt's affect was appropriate and she volunteered several positive comments to her peers.

## 2013-08-08 NOTE — Progress Notes (Signed)
Adult Activity Group Note   Group Topic: Art  Participation Level: Active  Participation Quality: Appropriate and Attentive  Affect:  Blunted and Irritable  Activity: Patients asked to create art to coincide with daily unit theme using any combination of markers, crayons, color pencils, construction paper, magazine clippings, glue, and scissors.   Additional Comments:  Pt participated in the coloring picture. Pt  Left early to speak with the doctor and when she returned, she became   irritated with a patient who was sitting in her chair.
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
## 2013-08-08 NOTE — Progress Notes (Signed)
Patient ID: Angelica [name], female   DOB: 1980/12/11, 33 y.o.   MRN: [id number] D: pt. In bed eyes closed, respirations even. A: Writer observed for s/s of distress, staff will monitor q7615min for safety. R: pt. Is safe on the unit, respirations unlabored, not distress noted.

## 2013-08-09 LAB — CARBAMAZEPINE LEVEL, TOTAL: Carbamazepine Lvl: 9.6 ug/mL (ref 4.0–12.0)

## 2013-08-09 MED ORDER — PALIPERIDONE PALMITATE 156 MG/ML IM SUSP: 156.0000 mg | INTRAMUSCULAR | Status: DC

## 2013-08-09 MED ORDER — TRAZODONE HCL 50 MG PO TABS: 50.0000 mg | ORAL_TABLET | Freq: Every evening | ORAL | Status: DC | PRN

## 2013-08-09 MED ORDER — PALIPERIDONE PALMITATE 234 MG/1.5ML IM SUSP: 156.0000 mg | INTRAMUSCULAR | Status: DC

## 2013-08-09 MED ORDER — BENZTROPINE MESYLATE 1 MG PO TABS: 1.0000 mg | ORAL_TABLET | Freq: Every day | ORAL | Status: DC

## 2013-08-09 MED ORDER — PALIPERIDONE ER 6 MG PO TB24: 6.0000 mg | ORAL_TABLET | Freq: Every day | ORAL | Status: DC

## 2013-08-09 MED ORDER — CARBAMAZEPINE ER 200 MG PO TB12: 200.0000 mg | ORAL_TABLET | Freq: Two times a day (BID) | ORAL | Status: DC

## 2013-08-09 NOTE — [facility] Suicide Risk Assessment (Signed)
   Demographic Factors:  Low socioeconomic status, Unemployed and female  Total Time spent with patient: 20 minutes  Psychiatric Specialty Exam: Physical Exam  Psychiatric: She has a normal mood and affect. Her speech is normal and behavior is normal. Judgment and thought content normal. Cognition and memory are normal.    Review of Systems  Constitutional: Negative.   HENT: Negative.   Eyes: Negative.   Respiratory: Negative.   Cardiovascular: Negative.   Gastrointestinal: Negative.   Genitourinary: Negative.   Musculoskeletal: Negative.   Skin: Negative.   Neurological: Negative.   Endo/Heme/Allergies: Negative.   Psychiatric/Behavioral: Negative.     Blood pressure 107/69, pulse 108, temperature 98.6 F (37 C), temperature source Oral, resp. rate 20, height 5\' 3"  (1.6 m), weight 89.812 kg (198 lb), SpO2 100.00%.Body mass index is 35.08 kg/(m^2).  General Appearance: Fairly Groomed  Patent attorneyye Contact::  Good  Speech:  Clear and Coherent and Normal Rate  Volume:  Normal  Mood:  Euthymic  Affect:  Appropriate  Thought Process:  Goal Directed  Orientation:  Full (Time, Place, and Person)  Thought Content:  Negative  Suicidal Thoughts:  No  Homicidal Thoughts:  No  Memory:  Immediate;   Fair Recent;   Fair Remote;   Fair  Judgement:  Fair  Insight:  Fair  Psychomotor Activity:  Normal  Concentration:  Fair  Recall:  FiservFair  Fund of Knowledge:Fair  Language: Fair  Akathisia:  No  Handed:  Right  AIMS (if indicated):     Assets:  Communication Skills Desire for Improvement Physical Health  Sleep:  Number of Hours: 6.5    Musculoskeletal: Strength & Muscle Tone: within normal limits Gait & Station: normal Patient leans: N/A   Mental Status Per Nursing Assessment::   On Admission:  NA  Current Mental Status by Physician: patient denies suicidal ideation, intent or plan  Loss Factors: Financial problems/change in socioeconomic status  Historical Factors: poor  impulse control  Risk Reduction Factors:   Responsible for children under 32 years of age, Sense of responsibility to family, Living with another person, especially a relative and Positive social support  Continued Clinical Symptoms:  Resolving delusions  Cognitive Features That Contribute To Risk:  Closed-mindedness Polarized thinking    Suicide Risk:  Minimal: No identifiable suicidal ideation.  Patients presenting with no risk factors but with morbid ruminations; may be classified as minimal risk based on the severity of the depressive symptoms  Discharge Diagnoses:   AXIS I:  Paranoid schizophrenia, chronic condition with acute exacerbation  AXIS II:  Deferred AXIS III:   Past Medical History  Diagnosis Date  . Migraine    AXIS IV:  other psychosocial or environmental problems and problems related to social environment AXIS V:  61-70 mild symptoms  Plan Of Care/Follow-up recommendations:  Activity:  as tolerated Diet:  healthy Tests:  routine Other:  patient to keep her after care appointment  Is patient on multiple antipsychotic therapies at discharge:  No   Has Patient had three or more failed trials of antipsychotic monotherapy by history:  No  Recommended Plan for Multiple Antipsychotic Therapies: NA    Thedore MinsAkintayo, Mojeed, MD 08/09/2013, 9:19 AM
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
## 2013-08-10 NOTE — Progress Notes (Signed)
Patient Discharge Instructions:  After Visit Summary (AVS):   Faxed to:  08/10/13 Discharge Summary Note:   Faxed to:  08/10/13 Psychiatric Admission Assessment Note:   Faxed to:  08/10/13 Suicide Risk Assessment - Discharge Assessment:   Faxed to:  08/10/13 Faxed/Sent to the Next Level Care provider:  08/10/13 [facility] @ [phone]  Jerelene ReddenSheena E Stapleton, 08/10/2013, 3:23 PM

## 2013-08-15 ENCOUNTER — Encounter (HOSPITAL_COMMUNITY): Payer: Self-pay | Admitting: Emergency Medicine

## 2013-08-15 ENCOUNTER — Emergency Department (HOSPITAL_COMMUNITY)
Admission: EM | Admit: 2013-08-15 | Discharge: 2013-08-16 | Disposition: A | Discharge status: Home / Self Care | Payer: Medicaid Other | Attending: Emergency Medicine | Admitting: Emergency Medicine

## 2013-08-15 DIAGNOSIS — Z8679 Personal history of other diseases of the circulatory system: Secondary | ICD-10-CM | POA: Insufficient documentation

## 2013-08-15 DIAGNOSIS — Z008 Encounter for other general examination: Secondary | ICD-10-CM | POA: Diagnosis present

## 2013-08-15 DIAGNOSIS — F200 Paranoid schizophrenia: Secondary | ICD-10-CM | POA: Insufficient documentation

## 2013-08-15 DIAGNOSIS — F17200 Nicotine dependence, unspecified, uncomplicated: Secondary | ICD-10-CM | POA: Diagnosis not present

## 2013-08-15 LAB — COMPREHENSIVE METABOLIC PANEL
ALK PHOS: 76 U/L (ref 39–117)
ALT: 37 U/L — ABNORMAL HIGH (ref 0–35)
AST: 21 U/L (ref 0–37)
Albumin: 3.6 g/dL (ref 3.5–5.2)
BUN: 11 mg/dL (ref 6–23)
CHLORIDE: 103 meq/L (ref 96–112)
CO2: 26 mEq/L (ref 19–32)
Calcium: 9.4 mg/dL (ref 8.4–10.5)
Creatinine, Ser: 0.8 mg/dL (ref 0.50–1.10)
GFR calc non Af Amer: >90 mL/min (ref >90)
GLUCOSE: 101 mg/dL — AB (ref 70–99)
POTASSIUM: 4.6 meq/L (ref 3.7–5.3)
Sodium: 139 mEq/L (ref 137–147)
TOTAL PROTEIN: 7 g/dL (ref 6.0–8.3)

## 2013-08-15 LAB — CBC
HEMATOCRIT: 39.2 % (ref 36.0–46.0)
Hemoglobin: 13.5 g/dL (ref 12.0–15.0)
MCH: 29.5 pg (ref 26.0–34.0)
MCHC: 34.4 g/dL (ref 30.0–36.0)
MCV: 85.8 fL (ref 78.0–100.0)
Platelets: 179 10*3/uL (ref 150–400)
RBC: 4.57 MIL/uL (ref 3.87–5.11)
RDW: 14.8 % (ref 11.5–15.5)
WBC: 8.1 10*3/uL (ref 4.0–10.5)

## 2013-08-15 LAB — RAPID URINE DRUG SCREEN, HOSP PERFORMED
Amphetamines: NOT DETECTED
BARBITURATES: NOT DETECTED
Benzodiazepines: NOT DETECTED
COCAINE: NOT DETECTED
Opiates: NOT DETECTED
TETRAHYDROCANNABINOL: NOT DETECTED

## 2013-08-15 LAB — ETHANOL: Alcohol, Ethyl (B): <11 mg/dL (ref 0–11)

## 2013-08-15 LAB — ACETAMINOPHEN LEVEL

## 2013-08-15 LAB — SALICYLATE LEVEL: Salicylate Lvl: <2 mg/dL — ABNORMAL LOW (ref 2.8–20.0)

## 2013-08-15 MED ORDER — ACETAMINOPHEN 325 MG PO TABS
650.0000 mg | ORAL_TABLET | ORAL | Status: DC | PRN
  Administered 2013-08-15: 650 mg via ORAL
  Filled 2013-08-15: qty 2

## 2013-08-15 MED ORDER — ZOLPIDEM TARTRATE 5 MG PO TABS: 5.0000 mg | ORAL_TABLET | Freq: Every evening | ORAL | Status: DC | PRN

## 2013-08-15 MED ORDER — IBUPROFEN 200 MG PO TABS
600.0000 mg | ORAL_TABLET | Freq: Three times a day (TID) | ORAL | Status: DC | PRN
  Administered 2013-08-16: 600 mg via ORAL
  Filled 2013-08-15: qty 3

## 2013-08-15 MED ORDER — PALIPERIDONE PALMITATE 156 MG/ML IM SUSP: 156.0000 mg | INTRAMUSCULAR | Status: DC

## 2013-08-15 MED ORDER — NICOTINE 21 MG/24HR TD PT24: 21.0000 mg | MEDICATED_PATCH | Freq: Every day | TRANSDERMAL | Status: DC

## 2013-08-15 MED ORDER — PALIPERIDONE ER 6 MG PO TB24
6.0000 mg | ORAL_TABLET | Freq: Every day | ORAL | Status: DC
  Administered 2013-08-16: 6 mg via ORAL
  Filled 2013-08-15 (×2): qty 1

## 2013-08-15 MED ORDER — TRAZODONE HCL 50 MG PO TABS: 50.0000 mg | ORAL_TABLET | Freq: Every evening | ORAL | Status: DC | PRN

## 2013-08-15 MED ORDER — CARBAMAZEPINE ER 200 MG PO TB12
200.0000 mg | ORAL_TABLET | Freq: Two times a day (BID) | ORAL | Status: DC
  Administered 2013-08-15 – 2013-08-16 (×3): 200 mg via ORAL
  Filled 2013-08-15 (×4): qty 1

## 2013-08-15 MED ORDER — LORAZEPAM 1 MG PO TABS: 1.0000 mg | ORAL_TABLET | Freq: Three times a day (TID) | ORAL | Status: DC | PRN

## 2013-08-15 MED ORDER — BENZTROPINE MESYLATE 1 MG PO TABS
1.0000 mg | ORAL_TABLET | Freq: Every day | ORAL | Status: DC
  Administered 2013-08-16: 1 mg via ORAL
  Filled 2013-08-15: qty 1

## 2013-08-15 MED ORDER — ONDANSETRON HCL 4 MG PO TABS: 4.0000 mg | ORAL_TABLET | Freq: Three times a day (TID) | ORAL | Status: DC | PRN

## 2013-08-15 NOTE — ED Notes (Addendum)
Pt. Has a pair of black pants, red shirt, butterfly notebook, Sim1 visa debit card and under clothing. All belongings are locked in locker 30.

## 2013-08-15 NOTE — ED Provider Notes (Signed)
Medical screening examination/treatment/procedure(s) were conducted as a shared visit with non-physician practitioner(s) and myself.  I personally evaluated the patient during the encounter.  Schizophrenic IVC by police, has been sharing medications. Patient is calm and cooperative.   EKG Interpretation None        Glynn OctaveStephen Rancour, MD 08/15/13 [phone]

## 2013-08-15 NOTE — ED Notes (Signed)
Pt belongings in locker 28 

## 2013-08-15 NOTE — ED Provider Notes (Signed)
CSN: [id number]     Arrival date & time 08/15/13  1205 History   First MD Initiated Contact with Patient 08/15/13 1304     Chief Complaint  Patient presents with  . Psychiatric Evaluation     (Consider location/radiation/quality/duration/timing/severity/associated sxs/prior Treatment) HPI  Patient to the ER by [employer] with IVC papers.  Patient says she has no idea why she is here. She is frustrated that she keeps being IVC'd and is not entirely sure who took out the papers. She says that she was "giving/sharing her trazodone" which makes her sleepy with someone but then they called the police on her. She also said that someone she lives with thinks she is crazy, she reports " im NOT crazy" i have never and do not cut or harm myself. She reports, I have no desire to harm any body else. She reports that she has been taking all of her medication except Trazodone and doesn't feel that she needs to be here.  IVC papers say that she has a and was recently discharged from behavioral health. Per IVC paper has been giving her medication to her neighbors. Patient speaks logically and is cooperative with me. Reports that she has left her Trazedone under the matt for one of her friends to take because she doesn't want it.    Past Medical History  Diagnosis Date  . No pertinent past medical history   . Medical history non-contributory   . Migraine   . Paranoid schizophrenia    Past Surgical History  Procedure Laterality Date  . Cyst removal neck    . Cyst removal neck  2005  . No past surgeries     History reviewed. No pertinent family history. History  Substance Use Topics  . Smoking status: Current Every Day Smoker -- 0.25 packs/day for 5 years    Types: Cigarettes  . Smokeless tobacco: Never Used  . Alcohol Use: No   OB History   Grav Para Term Preterm Abortions TAB SAB Ect Mult Living   2 2 2       2      Review of Systems   Review of Systems  Gen: no weight loss, fevers, chills,  night sweats  Eyes: no discharge or drainage, no occular pain or visual changes  Nose: no epistaxis or rhinorrhea  Mouth: no dental pain, no sore throat  Neck: no neck pain  Lungs:No wheezing, coughing or hemoptysis CV: no chest pain, palpitations, dependent edema or orthopnea  Abd: no abdominal pain, nausea, vomiting, diarrhea GU: no dysuria or gross hematuria  MSK:  No muscle weakness or pain Neuro: no headache, no focal neurologic deficits  Skin: no rash or wounds Psyche: no complaints    Allergies  Review of patient's allergies indicates no known allergies.  Home Medications   Prior to Admission medications   Medication Sig Start Date End Date Taking? Authorizing Provider  benztropine (COGENTIN) 1 MG tablet Take 1 tablet (1 mg total) by mouth daily. 08/09/13  Yes Fransisca KaufmannLaura Davis, NP  carbamazepine (TEGRETOL XR) 200 MG 12 hr tablet Take 1 tablet (200 mg total) by mouth 2 (two) times daily. 08/09/13  Yes Fransisca KaufmannLaura Davis, NP  paliperidone (INVEGA) 6 MG 24 hr tablet Take 1 tablet (6 mg total) by mouth daily. 08/09/13  Yes Fransisca KaufmannLaura Davis, NP  Paliperidone Palmitate 156 MG/ML SUSP Inject 1 mL (156 mg total) into the muscle every 30 (thirty) days. First dose of 234 mg received on 08/05/13 with next dose of 156  mg due in thirty days. 08/09/13  Yes Fransisca KaufmannLaura Davis, NP  traZODone (DESYREL) 50 MG tablet Take 1 tablet (50 mg total) by mouth at bedtime as needed (agitation). 08/09/13   Fransisca KaufmannLaura Davis, NP   BP 104/70  Pulse 82  Temp(Src) 98.4 F (36.9 C) (Oral)  Resp 16  SpO2 97% Physical Exam  Nursing note and vitals reviewed. Constitutional: She appears well-developed and well-nourished. No distress.  HENT:  Head: Normocephalic and atraumatic.  Eyes: Pupils are equal, round, and reactive to light.  Neck: Normal range of motion. Neck supple.  Cardiovascular: Normal rate and regular rhythm.   Pulmonary/Chest: Effort normal.  Abdominal: Soft.  Neurological: She is alert.  Skin: Skin is warm and dry.   Psychiatric: Her mood appears not anxious. Her affect is angry. Her affect is not blunt. Her speech is not rapid and/or pressured. She is not actively hallucinating. She does not exhibit a depressed mood. She expresses no homicidal and no suicidal ideation.  Poor insight and poor judgment  She is attentive.    ED Course  Procedures (including critical care time) Labs Review Labs Reviewed  COMPREHENSIVE METABOLIC PANEL - Abnormal; Notable for the following:    Glucose, Bld 101 (*)    ALT 37 (*)    Total Bilirubin <0.2 (*)    All other components within normal limits  SALICYLATE LEVEL - Abnormal; Notable for the following:    Salicylate Lvl <2.0 (*)    All other components within normal limits  ACETAMINOPHEN LEVEL  CBC  ETHANOL  URINE RAPID DRUG SCREEN (HOSP PERFORMED)    Imaging Review No results found.   EKG Interpretation None      MDM   Final diagnoses:  Paranoid schizophrenia    Patient has been IVC. I discussed this patient with Dr. Manus Gunningancour so that a first opinion can be done. Patient does not appears to be psychotic, but does have poor insight and poor judgement.   Labs reviewed and do not appear to show any acute findings. TTS consulted. Psych holding orders placed. Home meds ordered.  Dorthula Matasiffany G Greene, PA-C 08/15/13 1524
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
## 2013-08-15 NOTE — ED Notes (Signed)
Pt arrived via [employer] with IVC papers. [employer] stats that patient is non-compliant with her psych mediations. Pt denies any SI or HI. Pt has a hx of paranoid schizophrenia and [facility]. Per [employer] and IVC papers patient has been acting irrationally and giving her psych medications to her neighbors instead of taking them. Pt was cooperative in triage and with ED staff.

## 2013-08-15 NOTE — ED Notes (Signed)
Patient  at nurses station requesting her tegretol and feminine pads. Patient given feminine pads and informed that writer would bring medications to her room when pharmacy dispense it. Patient verbalized understanding. No acute distress noted. Safety maintain and encouragement and support provided.

## 2013-08-16 DIAGNOSIS — F200 Paranoid schizophrenia: Secondary | ICD-10-CM

## 2013-08-16 NOTE — Progress Notes (Signed)
Pt was given [facility] Resource packet and purpose was explained to pt. Pt was receptive and grateful of information. Pt stated "Thank you so much. I actually really appreciate this and will use this. You're right, if I don't know about the resources, than I wont use them".

## 2013-08-16 NOTE — [facility] Suicide Risk Assessment (Signed)
Suicide Risk Assessment  Discharge Assessment     Demographic Factors:  Caucasian and Low socioeconomic status  Total Time spent with patient: 45 minutes  Psychiatric Specialty Exam:     Blood pressure 114/71, pulse 89, temperature 98.7 F (37.1 C), temperature source Oral, resp. rate 14, SpO2 98.00%.There is no weight on file to calculate BMI.  General Appearance: Well Groomed  Patent attorneyye Contact::  Good  Speech:  Clear and Coherent  Volume:  Normal  Mood:  Euthymic  Affect:  Appropriate  Thought Process:  Coherent and Logical  Orientation:  Full (Time, Place, and Person)  Thought Content:  Negative  Suicidal Thoughts:  No  Homicidal Thoughts:  No  Memory:  Immediate;   Good Recent;   Good Remote;   Good  Judgement:  Intact  Insight:  Shallow  Psychomotor Activity:  Normal  Concentration:  Good  Recall:  Good  Fund of Knowledge:Good  Language: Good  Akathisia:  Negative  Handed:  Right  AIMS (if indicated):     Assets:  Communication Skills Housing Social Support  Sleep:       Musculoskeletal: Strength & Muscle Tone: within normal limits Gait & Station: normal Patient leans: N/A   Mental Status Per Nursing Assessment::   On Admission:     Current Mental Status by Physician: NA  Loss Factors: NA  Historical Factors: NA  Risk Reduction Factors:   NA  Continued Clinical Symptoms:  schizophrenia  Cognitive Features That Contribute To Risk:  Closed-mindedness    Suicide Risk:  Minimal: No identifiable suicidal ideation.  Patients presenting with no risk factors but with morbid ruminations; may be classified as minimal risk based on the severity of the depressive symptoms  Discharge Diagnoses:   AXIS I:  paranoid schizophrenia AXIS II:  Deferred AXIS III:   Past Medical History  Diagnosis Date  . No pertinent past medical history   . Medical history non-contributory   . Migraine   . Paranoid schizophrenia    AXIS IV:  chronic mental  illness AXIS V:  61-70 mild symptoms  Plan Of Care/Follow-up recommendations:  Activity:  resume usual activity Diet:  resume usual diet  Is patient on multiple antipsychotic therapies at discharge:  No   Has Patient had three or more failed trials of antipsychotic monotherapy by history:  No  Recommended Plan for Multiple Antipsychotic Therapies: NA    TAYLOR,GERALD D 08/16/2013, 3:36 PM

## 2013-08-16 NOTE — Progress Notes (Signed)
Per discussion with psychiatrist and NP, patient psychiatrically stable for discharge home. CSW left message for pt guardian, Vance PeperDenise Hayes, who is also patient mother. Patient is requesitng a new actt team, as she does not like working with [facility]. CSW to discuss with patient mother. Pt actt team shares that patient is fearful that paitent is going ti take cusotdy of patient children and she is not wanting to lose custody of the children and the actt team as also been asissting in regards to court.   Byrd HesselbachKristen Reed, LCSW [phone]  ED CSW 08/16/2013 1219pm

## 2013-08-16 NOTE — ED Notes (Signed)
Patient discharge via ambulatory with a steady gait. [employer] took patient home as a courtesy ride patient mother was unable to pick her up.

## 2013-08-16 NOTE — Progress Notes (Signed)
CSW made an attempt to get in touch with the patient's mother/guardian but someone in the home said she will not be home for another hour.    4:45pm- CSW spoke with Angelica [name] with ACTT as she reports that the patient had some progress the last time she was discharged and she would like to see that change again before this discharge.  Angelica [name] reports that the patient was managing herself for 4 days after the last discharge but she just will not take her medications.  CSW informed Angelica [name] that the ED psychiatrist is currently saying she is not meeting criteria for inpatient hospitalization and it would be on the community provider to develop an effective plan to keep her managed in the community.  CSW suggested that ACTT complete a behavioral or safety contract to provide for the patient and her family to assist them with interventions to help her manage in the community.  CSW reassured Angelica [name] that the emergency department will complete any task in our scope of practice to assist the patients but once we are successful in getting the patient stable for discharge we then make recommendations for follow up.  Angelica [name] states that she will be available once we get in touch with the mother for coordinate a conference for discharge.  Angelica [name] [phone].     Maryelizabeth Rowanressa Rodgerick Gilliand, MSW, [place]CS[place], 08/16/2013 Evening Clinical Social Worker [phone]

## 2013-08-16 NOTE — ED Notes (Signed)
Patient needs 1st examination filled out IVC expires at 1049 am today. [facility] TTS counselor informed.

## 2013-08-16 NOTE — Progress Notes (Signed)
  CARE MANAGEMENT ED NOTE 08/16/2013  Patient:  [facility],Angelica A   Account Number:  [id number]  Date Initiated:  08/16/2013  Documentation initiated by:  Edd ArbourGIBBS,KIMBERLY  Subjective/Objective Assessment:   33 yr [place] ivc for sharing trazodone with someone Stats trazodone makes her sleepy     Subjective/Objective Assessment Detail:   [facility] but pt does not like services has mother and grandmother  Dx Paranoid schizophrenia  July 15 court date noted in EPIC notes  [facility] [place] [facility] [place] [facility] [place] response hx of e medicaid search     Action/Plan:   EPIC updated   Action/Plan Detail:   Anticipated DC Date:       Status Recommendation to Physician:   Result of Recommendation:    Other ED Services  Consult Working Psychologist, educationallan    DC Planning Services  Outpatient Services - Pt will follow up  PCP issues  Other    Choice offered to / List presented to:            Status of service:  Completed, signed off  ED Comments:   ED Comments Detail:

## 2013-08-16 NOTE — Consult Note (Signed)
[facility] Face-to-Face Psychiatry Consult   Reason for Consult:  Involuntarily committed for not taking her medications Referring Physician:  ER MD  Angelica [name] is an 33 y.o. female. Total Time spent with patient: 45 minutes  Assessment: AXIS I:  paranoid schizophrenia AXIS II:  Deferred AXIS III:   Past Medical History  Diagnosis Date  . No pertinent past medical history   . Medical history non-contributory   . Migraine   . Paranoid schizophrenia    AXIS IV:  problems with primary support group AXIS V:  61-70 mild symptoms  Plan:  No evidence of imminent risk to self or others at present.    Subjective:   Angelica [name] is a 33 y.o. female patient admitted with not taking medications.  HPI:  Angelica [name] was clear that she was not suicidal, homicidal or hearing any voices.  She does have disagreements with her ACTteam and family but "I do not need to be in any hospitsl,I need to be home with my children".  Her ACTteam says she has not been taking her medications consistently.  She says "Give me a blood test.  I have been taking them". Regardless, she does not meet any criteria for involuntary commitment so she will be released. HPI Elements:   Location:  not taking medications, maybe sharing her medications and leaving  her medications out where the children could get them. Quality:  she denies all of this except sharing her trazadone with a friend. Severity:  apparently not being completely compliant with her meds. Timing:  situational. Duration:  days. Context:  as above.  Past Psychiatric History: Past Medical History  Diagnosis Date  . No pertinent past medical history   . Medical history non-contributory   . Migraine   . Paranoid schizophrenia     reports that she has been smoking Cigarettes.  She has a 1.25 pack-year smoking history. She has never used smokeless tobacco. She reports that she does not drink alcohol or use illicit drugs. History reviewed. No pertinent family  history. Family History Substance Abuse: No Family Supports: Yes, List: (Mother, Grandmother) Living Arrangements: Parent;Children;Other relatives (grandmother) Can pt return to current living arrangement?: Yes   Allergies:  No Known Allergies  ACT Assessment Complete:  Yes:    Educational Status    Risk to Self: Risk to self Suicidal Ideation: No Suicidal Intent: No Is patient at risk for suicide?: No Suicidal Plan?: No Access to Means: No What has been your use of drugs/alcohol within the last 12 months?: denies Previous Attempts/Gestures: No Intentional Self Injurious Behavior: None Family Suicide History: No Recent stressful life event(s): Conflict (Comment);Trauma (Comment) Persecutory voices/beliefs?: No Depression: Yes Depression Symptoms: Feeling angry/irritable;Isolating;Tearfulness (crying spells) Substance abuse history and/or treatment for substance abuse?: No Suicide prevention information given to non-admitted patients: Not applicable  Risk to Others: Risk to Others Homicidal Ideation: No Thoughts of Harm to Others: No Current Homicidal Intent: No Current Homicidal Plan: No Access to Homicidal Means: No Identified Victim: na History of harm to others?: No Assessment of Violence: None Noted (denies) Violent Behavior Description: aggression towards family Does patient have access to weapons?: No Criminal Charges Pending?: No Does patient have a court date: Yes Court Date: 08/31/13 (guardianship)  Abuse:    Prior Inpatient Therapy: Prior Inpatient Therapy Prior Inpatient Therapy: Yes Prior Therapy Facilty[facility]Provider[facility]): [facility] Reason for Treatment: psychosis  Prior Outpatient Therapy: Prior Outpatient Therapy Prior Outpatient Therapy: Yes Prior Therapy Facilty/Provider(s): [facility] Reason for Treatment: psychosis  Additional  Information: Additional Information 1:1 In Past 12 Months?: No CIRT Risk: No Elopement Risk: No Does patient have medical clearance?:  Yes                  Objective: Blood pressure 114/71, pulse 89, temperature 98.7 F (37.1 C), temperature source Oral, resp. rate 14, SpO2 98.00%.There is no weight on file to calculate BMI. Results for orders placed during the hospital encounter of 08/15/13 (from the past 72 hour(s))  URINE RAPID DRUG SCREEN (HOSP PERFORMED)     Status: None   Collection Time    08/15/13 12:52 PM      Result Value Ref Range   Opiates NONE DETECTED  NONE DETECTED   Cocaine NONE DETECTED  NONE DETECTED   Benzodiazepines NONE DETECTED  NONE DETECTED   Amphetamines NONE DETECTED  NONE DETECTED   Tetrahydrocannabinol NONE DETECTED  NONE DETECTED   Barbiturates NONE DETECTED  NONE DETECTED   Comment:            DRUG SCREEN FOR MEDICAL PURPOSES     ONLY.  IF CONFIRMATION IS NEEDED     FOR ANY PURPOSE, NOTIFY LAB     WITHIN 5 DAYS.                LOWEST DETECTABLE LIMITS     FOR URINE DRUG SCREEN     Drug Class       Cutoff (ng/mL)     Amphetamine      1000     Barbiturate      200     Benzodiazepine   678     Tricyclics       938     Opiates          300     Cocaine          300     THC              50  ACETAMINOPHEN LEVEL     Status: None   Collection Time    08/15/13  1:01 PM      Result Value Ref Range   Acetaminophen (Tylenol), Serum <15.0  10 - 30 ug/mL   Comment:            THERAPEUTIC CONCENTRATIONS VARY     SIGNIFICANTLY. A RANGE OF 10-30     ug/mL MAY BE AN EFFECTIVE     CONCENTRATION FOR MANY PATIENTS.     HOWEVER, SOME ARE BEST TREATED     AT CONCENTRATIONS OUTSIDE THIS     RANGE.     ACETAMINOPHEN CONCENTRATIONS     >150 ug/mL AT 4 HOURS AFTER     INGESTION AND >50 ug/mL AT 12     HOURS AFTER INGESTION ARE     OFTEN ASSOCIATED WITH TOXIC     REACTIONS.  CBC     Status: None   Collection Time    08/15/13  1:01 PM      Result Value Ref Range   WBC 8.1  4.0 - 10.5 K/uL   RBC 4.57  3.87 - 5.11 MIL/uL   Hemoglobin 13.5  12.0 - 15.0 g/dL   HCT 39.2  36.0 -  46.0 %   MCV 85.8  78.0 - 100.0 fL   MCH 29.5  26.0 - 34.0 pg   MCHC 34.4  30.0 - 36.0 g/dL   RDW 14.8  11.5 - 15.5 %   Platelets 179  150 - 400 K/uL  COMPREHENSIVE METABOLIC PANEL     Status: Abnormal   Collection Time    08/15/13  1:01 PM      Result Value Ref Range   Sodium 139  137 - 147 mEq/L   Potassium 4.6  3.7 - 5.3 mEq/L   Chloride 103  96 - 112 mEq/L   CO2 26  19 - 32 mEq/L   Glucose, Bld 101 (*) 70 - 99 mg/dL   BUN 11  6 - 23 mg/dL   Creatinine, Ser 0.80  0.50 - 1.10 mg/dL   Calcium 9.4  8.4 - 10.5 mg/dL   Total Protein 7.0  6.0 - 8.3 g/dL   Albumin 3.6  3.5 - 5.2 g/dL   AST 21  0 - 37 U/L   ALT 37 (*) 0 - 35 U/L   Alkaline Phosphatase 76  39 - 117 U/L   Total Bilirubin <0.2 (*) 0.3 - 1.2 mg/dL   GFR calc non Af Amer >90  >90 mL/min   GFR calc Af Amer >90  >90 mL/min   Comment: (NOTE)     The eGFR has been calculated using the CKD EPI equation.     This calculation has not been validated in all clinical situations.     eGFR's persistently <90 mL/min signify possible Chronic Kidney     Disease.  ETHANOL     Status: None   Collection Time    08/15/13  1:01 PM      Result Value Ref Range   Alcohol, Ethyl (B) <11  0 - 11 mg/dL   Comment:            LOWEST DETECTABLE LIMIT FOR     SERUM ALCOHOL IS 11 mg/dL     FOR MEDICAL PURPOSES ONLY  SALICYLATE LEVEL     Status: Abnormal   Collection Time    08/15/13  1:01 PM      Result Value Ref Range   Salicylate Lvl <7.4 (*) 2.8 - 20.0 mg/dL   Labs are reviewed and are pertinent for no psychiatric issue.  Current Facility-Administered Medications  Medication Dose Route Frequency Provider Last Rate Last Dose  . acetaminophen (TYLENOL) tablet 650 mg  650 mg Oral Q4H PRN Linus Mako, PA-C   650 mg at 08/15/13 2119  . benztropine (COGENTIN) tablet 1 mg  1 mg Oral Daily Linus Mako, PA-C   1 mg at 08/16/13 2595  . carbamazepine (TEGRETOL XR) 12 hr tablet 200 mg  200 mg Oral BID Linus Mako, PA-C   200 mg at  08/16/13 6387  . ibuprofen (ADVIL,MOTRIN) tablet 600 mg  600 mg Oral Q8H PRN Linus Mako, PA-C   600 mg at 08/16/13 1513  . LORazepam (ATIVAN) tablet 1 mg  1 mg Oral Q8H PRN Linus Mako, PA-C      . nicotine (NICODERM CQ - dosed in mg/24 hours) patch 21 mg  21 mg Transdermal Daily Tiffany Marilu Favre, PA-C      . ondansetron (ZOFRAN) tablet 4 mg  4 mg Oral Q8H PRN Linus Mako, PA-C      . paliperidone (INVEGA) 24 hr tablet 6 mg  6 mg Oral Daily Linus Mako, PA-C   6 mg at 08/16/13 5643  . [START ON 09/04/2013] Paliperidone Palmitate SUSP 156 mg  156 mg Intramuscular Q30 days Tiffany G Greene, PA-C      . traZODone (DESYREL) tablet 50 mg  50 mg Oral QHS PRN Linus Mako,  PA-C      . zolpidem (AMBIEN) tablet 5 mg  5 mg Oral QHS PRN Linus Mako, PA-C       Current Outpatient Prescriptions  Medication Sig Dispense Refill  . benztropine (COGENTIN) 1 MG tablet Take 1 tablet (1 mg total) by mouth daily.  30 tablet  0  . carbamazepine (TEGRETOL XR) 200 MG 12 hr tablet Take 1 tablet (200 mg total) by mouth 2 (two) times daily.  60 tablet  0  . paliperidone (INVEGA) 6 MG 24 hr tablet Take 1 tablet (6 mg total) by mouth daily.  30 tablet  0  . Paliperidone Palmitate 156 MG/ML SUSP Inject 1 mL (156 mg total) into the muscle every 30 (thirty) days. First dose of 234 mg received on 08/05/13 with next dose of 156 mg due in thirty days.  0.9 mL  3  . traZODone (DESYREL) 50 MG tablet Take 1 tablet (50 mg total) by mouth at bedtime as needed (agitation).  30 tablet  0    Psychiatric Specialty Exam:     Blood pressure 114/71, pulse 89, temperature 98.7 F (37.1 C), temperature source Oral, resp. rate 14, SpO2 98.00%.There is no weight on file to calculate BMI.  General Appearance: Well Groomed  Engineer, water::  Good  Speech:  Clear and Coherent  Volume:  Normal  Mood:  Euthymic  Affect:  Appropriate  Thought Process:  Coherent and Logical  Orientation:  Full (Time, Place, and  Person)  Thought Content:  Negative  Suicidal Thoughts:  No  Homicidal Thoughts:  No  Memory:  Immediate;   Good Recent;   Good Remote;   Good  Judgement:  Intact  Insight:  Shallow  Psychomotor Activity:  Normal  Concentration:  Good  Recall:  Good  Fund of Knowledge:Good  Language: Good  Akathisia:  Negative  Handed:  Right  AIMS (if indicated):     Assets:  Communication Skills Housing Social Support  Sleep:      Musculoskeletal: Strength & Muscle Tone: within normal limits Gait & Station: normal Patient leans: N/A  Treatment Plan Summary: release from commitment and discharge home to be followed outpatient .  Does not meet criteria for admission  TAYLOR,GERALD D 08/16/2013 3:22 PM
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
## 2013-08-16 NOTE — BHH Counselor (Signed)
Pt's IVC has been rescinded by Dr. Ladona Ridgelaylor. [facility] and original placed in IVC NB in [place]. Writer called [facility] to refer pt to their ACT Team [phone] but no one answering phone.   Angelica [name], [place] Assessment Counselor

## 2013-12-19 ENCOUNTER — Encounter (HOSPITAL_COMMUNITY): Payer: Self-pay | Admitting: Emergency Medicine

## 2014-01-03 ENCOUNTER — Other Ambulatory Visit: Payer: Self-pay

## 2014-01-05 LAB — CYTOLOGY - PAP

## 2014-08-25 ENCOUNTER — Emergency Department (HOSPITAL_COMMUNITY)
Admission: EM | Admit: 2014-08-25 | Discharge: 2014-08-28 | Disposition: A | Discharge status: Other Acute Inpatient Hospital | Payer: Medicaid Other | Attending: Emergency Medicine | Admitting: Emergency Medicine

## 2014-08-25 DIAGNOSIS — Z720 Tobacco use: Secondary | ICD-10-CM | POA: Diagnosis not present

## 2014-08-25 DIAGNOSIS — Z8679 Personal history of other diseases of the circulatory system: Secondary | ICD-10-CM | POA: Diagnosis not present

## 2014-08-25 DIAGNOSIS — Z046 Encounter for general psychiatric examination, requested by authority: Secondary | ICD-10-CM | POA: Diagnosis present

## 2014-08-25 DIAGNOSIS — Z3202 Encounter for pregnancy test, result negative: Secondary | ICD-10-CM | POA: Diagnosis not present

## 2014-08-25 DIAGNOSIS — Z79899 Other long term (current) drug therapy: Secondary | ICD-10-CM | POA: Diagnosis not present

## 2014-08-25 DIAGNOSIS — F200 Paranoid schizophrenia: Secondary | ICD-10-CM | POA: Diagnosis present

## 2014-08-25 LAB — CBC WITH DIFFERENTIAL/PLATELET
Basophils Absolute: 0 10*3/uL (ref 0.0–0.1)
Basophils Relative: 0 % (ref 0–1)
Eosinophils Absolute: 0.2 10*3/uL (ref 0.0–0.7)
Eosinophils Relative: 2 % (ref 0–5)
HEMATOCRIT: 43.9 % (ref 36.0–46.0)
Hemoglobin: 14.6 g/dL (ref 12.0–15.0)
LYMPHS ABS: 2.4 10*3/uL (ref 0.7–4.0)
LYMPHS PCT: 26 % (ref 12–46)
MCH: 29.1 pg (ref 26.0–34.0)
MCHC: 33.3 g/dL (ref 30.0–36.0)
MCV: 87.6 fL (ref 78.0–100.0)
MONO ABS: 0.6 10*3/uL (ref 0.1–1.0)
Monocytes Relative: 6 % (ref 3–12)
Neutro Abs: 6.1 10*3/uL (ref 1.7–7.7)
Neutrophils Relative %: 66 % (ref 43–77)
PLATELETS: 198 10*3/uL (ref 150–400)
RBC: 5.01 MIL/uL (ref 3.87–5.11)
RDW: 14.2 % (ref 11.5–15.5)
WBC: 9.3 10*3/uL (ref 4.0–10.5)

## 2014-08-25 LAB — COMPREHENSIVE METABOLIC PANEL
ALBUMIN: 4.1 g/dL (ref 3.5–5.0)
ALT: 29 U/L (ref 14–54)
AST: 17 U/L (ref 15–41)
Alkaline Phosphatase: 62 U/L (ref 38–126)
Anion gap: 8 (ref 5–15)
BUN: 12 mg/dL (ref 6–20)
CHLORIDE: 107 mmol/L (ref 101–111)
CO2: 25 mmol/L (ref 22–32)
CREATININE: 0.82 mg/dL (ref 0.44–1.00)
Calcium: 9.4 mg/dL (ref 8.9–10.3)
GFR calc Af Amer: >60 mL/min (ref >60)
GFR calc non Af Amer: >60 mL/min (ref >60)
Glucose, Bld: 100 mg/dL — ABNORMAL HIGH (ref 65–99)
Potassium: 4.1 mmol/L (ref 3.5–5.1)
SODIUM: 140 mmol/L (ref 135–145)
TOTAL PROTEIN: 7.3 g/dL (ref 6.5–8.1)
Total Bilirubin: 0.5 mg/dL (ref 0.3–1.2)

## 2014-08-25 LAB — RAPID URINE DRUG SCREEN, HOSP PERFORMED
AMPHETAMINES: NOT DETECTED
Barbiturates: NOT DETECTED
Benzodiazepines: NOT DETECTED
Cocaine: NOT DETECTED
OPIATES: NOT DETECTED
Tetrahydrocannabinol: NOT DETECTED

## 2014-08-25 LAB — POC URINE PREG, ED: Preg Test, Ur: NEGATIVE

## 2014-08-25 LAB — ETHANOL: Alcohol, Ethyl (B): <5 mg/dL (ref <5)

## 2014-08-25 MED ORDER — PALIPERIDONE ER 6 MG PO TB24
6.0000 mg | ORAL_TABLET | Freq: Every day | ORAL | Status: DC
  Filled 2014-08-25 (×4): qty 1

## 2014-08-25 MED ORDER — CARBAMAZEPINE ER 200 MG PO TB12
200.0000 mg | ORAL_TABLET | Freq: Two times a day (BID) | ORAL | Status: DC
  Administered 2014-08-28: 200 mg via ORAL
  Filled 2014-08-25 (×8): qty 1

## 2014-08-25 MED ORDER — TRAZODONE HCL 50 MG PO TABS: 50.0000 mg | ORAL_TABLET | Freq: Every evening | ORAL | Status: DC | PRN

## 2014-08-25 MED ORDER — BENZTROPINE MESYLATE 1 MG PO TABS
1.0000 mg | ORAL_TABLET | Freq: Every day | ORAL | Status: DC
  Administered 2014-08-28: 1 mg via ORAL
  Filled 2014-08-25 (×2): qty 1

## 2014-08-25 NOTE — Progress Notes (Signed)
[facility] [address] [place], [place] [id number] [phone]

## 2014-08-25 NOTE — ED Notes (Signed)
Pt. Noted in room. No complaints or concerns voiced. No distress or abnormal behavior noted. Will continue to monitor with security cameras. Q 15 minute rounds continue. Pt. Refuses her med.

## 2014-08-25 NOTE — ED Provider Notes (Signed)
CSN: [id number]     Arrival date & time 08/25/14  1102 History   First MD Initiated Contact with Patient 08/25/14 1103     Chief Complaint  Patient presents with  . IVC     HPI  patient was brought into the emergency room under involuntary commitment paperwork. Patient has history of schizoaffective disorder. According to the paperwork, the patient has had increased verbal aggression towards her family members. She has been paranoid. She has been having conversations with people who were not there. Patient also discussed leaving the country with her children. She has not been sleeping well. She has not been taking her medications. A shunt therapist was concerned about her safety and the children's safety so she was involuntarily committed. Patient herself denies any complaints. She denies having any suicidal or homicidal ideation. She denies hearing any voices or feeling paranoid.   Past Medical History  Diagnosis Date  . No pertinent past medical history   . Medical history non-contributory   . Migraine   . Paranoid schizophrenia    Past Surgical History  Procedure Laterality Date  . Cyst removal neck    . Cyst removal neck  2005  . No past surgeries     No family history on file. History  Substance Use Topics  . Smoking status: Current Every Day Smoker -- 0.25 packs/day for 5 years    Types: Cigarettes  . Smokeless tobacco: Never Used  . Alcohol Use: No   OB History    Gravida Para Term Preterm AB TAB SAB Ectopic Multiple Living   2 2 2       2      Review of Systems  All other systems reviewed and are negative.     Allergies  Review of patient's allergies indicates no known allergies.  Home Medications   Prior to Admission medications   Medication Sig Start Date End Date Taking? Authorizing Provider  benztropine (COGENTIN) 1 MG tablet Take 1 tablet (1 mg total) by mouth daily. Patient not taking: Reported on 08/25/2014 08/09/13   Thermon LeylandLaura A Davis, NP  carbamazepine  (TEGRETOL XR) 200 MG 12 hr tablet Take 1 tablet (200 mg total) by mouth 2 (two) times daily. Patient not taking: Reported on 08/25/2014 08/09/13   Thermon LeylandLaura A Davis, NP  paliperidone (INVEGA) 6 MG 24 hr tablet Take 1 tablet (6 mg total) by mouth daily. Patient not taking: Reported on 08/25/2014 08/09/13   Thermon LeylandLaura A Davis, NP  Paliperidone Palmitate 156 MG/ML SUSP Inject 1 mL (156 mg total) into the muscle every 30 (thirty) days. First dose of 234 mg received on 08/05/13 with next dose of 156 mg due in thirty days. Patient not taking: Reported on 08/25/2014 08/09/13   Thermon LeylandLaura A Davis, NP  traZODone (DESYREL) 50 MG tablet Take 1 tablet (50 mg total) by mouth at bedtime as needed (agitation). Patient not taking: Reported on 08/25/2014 08/09/13   Thermon LeylandLaura A Davis, NP   BP 120/63 mmHg  Pulse 82  Temp(Src) 98.1 F (36.7 C) (Oral)  Resp 20  SpO2 97% Physical Exam  Constitutional: She appears well-developed and well-nourished. No distress.  HENT:  Head: Normocephalic and atraumatic.  Right Ear: External ear normal.  Left Ear: External ear normal.  Eyes: Conjunctivae are normal. Right eye exhibits no discharge. Left eye exhibits no discharge. No scleral icterus.  Neck: Neck supple. No tracheal deviation present.  Cardiovascular: Normal rate, regular rhythm and intact distal pulses.   Pulmonary/Chest: Effort normal and breath  sounds normal. No stridor. No respiratory distress. She has no wheezes. She has no rales.  Abdominal: Soft. Bowel sounds are normal. She exhibits no distension. There is no tenderness. There is no rebound and no guarding.  Musculoskeletal: She exhibits no edema or tenderness.  Neurological: She is alert. She has normal strength. No cranial nerve deficit (no facial droop, extraocular movements intact, no slurred speech) or sensory deficit. She exhibits normal muscle tone. She displays no seizure activity. Coordination normal.  Skin: Skin is warm and dry. No rash noted.  Psychiatric: Her affect is not  angry and not labile. Her speech is not rapid and/or pressured, not delayed and not tangential. She is not agitated, not aggressive, not hyperactive and not combative. She does not exhibit a depressed mood. She expresses no homicidal and no suicidal ideation. She expresses no suicidal plans and no homicidal plans.  Nursing note and vitals reviewed.   ED Course  Procedures (including critical care time) Labs Review Labs Reviewed  CBC WITH DIFFERENTIAL/PLATELET  COMPREHENSIVE METABOLIC PANEL  URINE RAPID DRUG SCREEN, HOSP PERFORMED  ETHANOL  POC URINE PREG, ED    Imaging Review No results found.   EKG Interpretation None      MDM   Final diagnoses:  Paranoid schizophrenia    Patient denies any psychiatric complaints. Clearly the involuntary commitment paperwork indicates a very different story.  In the ED she is currently calm and cooperative.  Will consult with TTS for further evaluation and assessment.    Linwood Dibbles, MD 08/25/14 1125

## 2014-08-25 NOTE — ED Notes (Signed)
Report given to Alcario Droughtanika, [place] psych ED. Pt ready for transfer.

## 2014-08-25 NOTE — ED Notes (Signed)
Pt. Noted sleeping in room. No complaints or concerns voiced. No distress or abnormal behavior noted. Will continue to monitor with security cameras. Q 15 minute rounds continue. 

## 2014-08-25 NOTE — ED Notes (Signed)
Pt is awake and alert, denies SI/HI. Pt is refusing to take medication or change into hospital issued scrubs. Will continue to monitor for safety

## 2014-08-25 NOTE — ED Notes (Signed)
[facility] that state: "Respondent has been diagnosed with schizo-affective disorder, she's had a increase in symptoms.  Therapist is concerned for her safety of the (2) young children in the home.  Respondent is verbally aggressive toward family members, shows signs of paranoia.  She is seeing things and people who are not there, having conversations with people who are not there.  She discussed leaving the country with children, not sleeping, only 8 hours of sleep over the past week.  She is non-compliant with medication regimen.  Therapist is concerned for her and the childrens safety".    [facility], pt has been refusing medications and having bizarre and paranoid behavior.  Pt very denial of treatment needs.  Pt needs force medications per MD order.

## 2014-08-25 NOTE — ED Notes (Signed)
Report received from Tanika RN. Pt. Alert and oriented in no distress denies SI, HI, AVH and pain.  Pt. Instructed to come to me with problems or concerns.Will continue to monitor for safety via security cameras and Q 15 minute checks. 

## 2014-08-25 NOTE — BH Assessment (Signed)
Assessment Note  Angelica [name] is an 34 y.o. female. Patient is a [facility] patient transferred because of increased symptoms such as paranoia, verbal aggression towards family, responding to internal stimuli, and non-compliance with medications.  It was documented in an ACTT note the patient had been becoming more aggressive with her children but they are safe in the care of the grandmother.   CSW spoke with Angelica [name], ACTT worker to collect collateral information.  Patient reports the patient is non-compliant with all medications and was more stable prior to the discontinuation of her Invega injection a year ago.  Angelica [name], ACTT suggested her Hinda Glatternvega 156mg  be started again to ensure more progress in treatment.   Patient denies all alleged allegations on the IVC and does the reason for emergency room services.  Patient agrees with the non-compliance of medications because she does believe she needs help.   "Its my right not to take medications".    CSW consulted with Dr. Ladona Ridgelaylor it is recommended to monitor overnight and potentially restart Invega injections.    Axis I: Schizoaffective Disorder Axis II: Deferred Axis III:  Past Medical History  Diagnosis Date  . No pertinent past medical history   . Medical history non-contributory   . Migraine   . Paranoid schizophrenia    Axis IV: economic problems, other psychosocial or environmental problems, problems related to social environment, problems with access to health care services and problems with primary support group Axis V: 21-30 behavior considerably influenced by delusions or hallucinations OR serious impairment in judgment, communication OR inability to function in almost all areas  Past Medical History:  Past Medical History  Diagnosis Date  . No pertinent past medical history   . Medical history non-contributory   . Migraine   . Paranoid schizophrenia     Past Surgical History  Procedure Laterality Date  . Cyst removal neck    .  Cyst removal neck  2005  . No past surgeries      Family History: No family history on file.  Social History:  reports that she has been smoking Cigarettes.  She has a 1.25 pack-year smoking history. She has never used smokeless tobacco. She reports that she does not drink alcohol or use illicit drugs.  Additional Social History:  Alcohol / Drug Use Prescriptions: See MARs History of alcohol / drug use?: No history of alcohol / drug abuse  CIWA: CIWA-Ar BP: 120/63 mmHg Pulse Rate: 82 COWS:    Allergies: No Known Allergies  Home Medications:  (Not in a hospital admission)  OB/GYN Status:  No LMP recorded.  General Assessment Data [facility] of Assessment: [facility] TTS Assessment: In system Is this a Tele or Face-to-Face Assessment?: Face-to-Face Is this an Initial Assessment or a Re-assessment for this encounter?: Initial Assessment Marital status: Single Maiden name: Ramsay Is patient pregnant?: No Pregnancy Status: No Living Arrangements: Parent, Children Can pt return to current living arrangement?: Yes Admission Status: Involuntary Is patient capable of signing voluntary admission?: No [facility] Insurance type: Medicaid  Medical Screening Exam [facility] Walk-in ONLY) Medical Exam completed: Yes  Crisis Care Plan Living Arrangements: Parent, Children [facility] of [facility]iatrist: [facility] Name of Therapist: [facility]  Education Status Is patient currently in school?: No  Risk to self with the past 6 months Suicidal Ideation: No-Not Currently/Within Last 6 Months Has patient been a risk to self within the past 6 months prior to admission? : No Suicidal Intent: No-Not Currently/Within Last 6 Months Has patient  had any suicidal intent within the past 6 months prior to admission? : No Is patient at risk for suicide?: No Suicidal Plan?: No-Not Currently/Within Last 6 Months Has patient had any suicidal plan within the past 6 months prior to admission? :  No Access to Means: No What has been your use of drugs/alcohol within the last 12 months?: none How many times?: 0 Intentional Self Injurious Behavior: None Family Suicide History: No Recent stressful life event(s): Trauma (Comment), Other (Comment) (non compliance with meds) Persecutory voices/beliefs?: Yes Depression: No Substance abuse history and/or treatment for substance abuse?: No  Risk to Others within the past 6 months Homicidal Ideation: No-Not Currently/Within Last 6 Months Does patient have any lifetime risk of violence toward others beyond the six months prior to admission? : No Thoughts of Harm to Others: No-Not Currently Present/Within Last 6 Months Current Homicidal Intent: No-Not Currently/Within Last 6 Months Current Homicidal Plan: No-Not Currently/Within Last 6 Months Access to Homicidal Means: No History of harm to others?: Yes Assessment of Violence: In past 6-12 months Violent Behavior Description: verbal aggression towards family Does patient have access to weapons?: No Criminal Charges Pending?: No Does patient have a court date: No Is patient on probation?: No  Psychosis Hallucinations: Auditory (Pt denies) Delusions: Persecutory  Mental Status Report Appearance/Hygiene: In hospital gown Eye Contact: Fair Motor Activity: Freedom of movement Speech: Tangential Level of Consciousness: Irritable Mood: Labile Affect: Blunted Anxiety Level: Minimal Thought Processes: Tangential Judgement: Impaired Orientation: Person, Place Obsessive Compulsive Thoughts/Behaviors: None  Cognitive Functioning Concentration: Fair Memory: Recent Intact, Remote Intact IQ: Average Insight: Poor Impulse Control: Poor Appetite: Fair Sleep: No Change Vegetative Symptoms: None  ADLScreening [facility] Assessment Services) Patient's cognitive ability adequate to safely complete daily activities?: No Patient able to express need for assistance with ADLs?: Yes Independently  performs ADLs?: Yes (appropriate for developmental age)  Prior Inpatient Therapy Prior Inpatient Therapy: Yes Prior Therapy Dates: 2015 Prior Therapy Facilty/Provider(s): [facility] Reason for Treatment: Psychosis  Prior Outpatient Therapy Prior Outpatient Therapy: Yes Prior Therapy Dates: currently Prior Therapy Facilty/Provider(s): [facility] Reason for Treatment: Psychosis Does patient have an ACCT team?: Yes Does patient have Intensive In-House Services?  : No Does patient have [facility] services? : Yes Does patient have P4CC services?: No  ADL Screening (condition at time of admission) Patient's cognitive ability adequate to safely complete daily activities?: No Patient able to express need for assistance with ADLs?: Yes Independently performs ADLs?: Yes (appropriate for developmental age)       Abuse/Neglect Assessment (Assessment to be complete while patient is alone) Physical Abuse: Denies Verbal Abuse: Denies Sexual Abuse: Denies Exploitation of patient/patient's resources: Denies Self-Neglect: Denies Values / Beliefs Cultural Requests During Hospitalization: None Spiritual Requests During Hospitalization: None Consults Spiritual Care Consult Needed: No Social Work Consult Needed: No      Additional Information 1:1 In Past 12 Months?: No CIRT Risk: No Elopement Risk: No Does patient have medical clearance?: Yes     Disposition:  Disposition Initial Assessment Completed for this Encounter: Yes Disposition of Patient: Other dispositions Other disposition(s): Other (Comment) (observation in the ED)  On Site Evaluation by:   Reviewed with Physician:    Maryelizabeth Rowan A 08/25/2014 12:37 PM

## 2014-08-25 NOTE — ED Notes (Signed)
[facility] Expected date:  Expected time:  Means of arrival:  Comments: Hold for room 16

## 2014-08-25 NOTE — ED Notes (Signed)
Made 2  an attempts to draw blood. Got it  to start but it would stop before it get enough to test

## 2014-08-25 NOTE — ED Notes (Signed)
Pt does not want to try for UA.  Will try back

## 2014-08-25 NOTE — ED Notes (Signed)
Bed: [id number] Expected date:  Expected time:  Means of arrival:  Comments: [facility]

## 2014-08-25 NOTE — ED Notes (Signed)
Patient asked to change into our burgundy scrubs, patient said she'd like to wait until she is able to shower. [facility]. RN states this is okay, will report to [facility].

## 2014-08-25 NOTE — ED Notes (Signed)
Pt states she refused to take medications and that she feels she has a right to refuse medications. Pt oriented, calm and cooperative. Awaiting transfer to psych ED.

## 2014-08-26 DIAGNOSIS — F200 Paranoid schizophrenia: Secondary | ICD-10-CM | POA: Diagnosis not present

## 2014-08-26 MED ORDER — LORAZEPAM 0.5 MG PO TABS: 1.0000 mg | ORAL_TABLET | Freq: Three times a day (TID) | ORAL | Status: DC | PRN

## 2014-08-26 MED ORDER — HYDROXYZINE HCL 25 MG PO TABS: 50.0000 mg | ORAL_TABLET | Freq: Three times a day (TID) | ORAL | Status: DC | PRN

## 2014-08-26 NOTE — ED Notes (Signed)
Up to the bathroom 

## 2014-08-26 NOTE — Consult Note (Signed)
[facility] Consult   Reason for Consult:  Paranoid Schizophrenia, acute exacerbation Referring Physician:  EDP Patient Identification: Angelica [name] MRN:  [id number] Principal Diagnosis: Paranoid schizophrenia, chronic condition with acute exacerbation Diagnosis:   Patient Active Problem List   Diagnosis Date Noted  . Paranoid schizophrenia, chronic condition with acute exacerbation [F20.0] 07/30/2013    Priority: High  . Psychosis [F29] 07/30/2013    Total Time spent with patient: 1 hour  Subjective:   Angelica [name] is a 34 y.o. female patient admitted with Paranoid Schizophrenia, acute exacerbation.  HPI: AA female, 34 years old was evaluated for medication non compliance and agitation.  She was was brought in by [facility] under IVC taken out by [facility].  Patient has an ACT team who initiated care for patient for not taking her medications for one year.  Patient exhibits a lot of anger at home where she lives with her two children.  It is documented that patient have been verbally abusive to family members and have not taken medications in one year.  This morning she stated that she has the right to not take medications.  Patient states she does not know why she is brought to the hospital and asked to be discharged home.   Patient denied suffering from Mental illness and denied ever taking any medications.  She did not want to answer most of our questions and stated"I am not seeing thing, hearing things and I am not a danger to myself".  Patient has been accepted for admission and we will be seeking placement at any facility with available beds.  HPI Elements:   Location:  Paranoid Schizophrenia, exacerbation, medication non compliance. Quality:  severe. Severity:  severe. Timing:  acute. Duration:  Chronic mental illness. Context:  IVC by [facility] for medication non compliance and agitataion..  Past Medical History:  Past Medical History  Diagnosis Date  . No pertinent  past medical history   . Medical history non-contributory   . Migraine   . Paranoid schizophrenia     Past Surgical History  Procedure Laterality Date  . Cyst removal neck    . Cyst removal neck  2005  . No past surgeries     Family History: No family history on file. Social History:  History  Alcohol Use No     History  Drug Use No    History   Social History  . Marital Status: Single    Spouse Name: N/A  . Number of Children: N/A  . Years of Education: N/A   Social History Main Topics  . Smoking status: Current Every Day Smoker -- 0.25 packs/day for 5 years    Types: Cigarettes  . Smokeless tobacco: Never Used  . Alcohol Use: No  . Drug Use: No  . Sexual Activity: Yes    Birth Control/ Protection: Condom   Other Topics Concern  . Not on file   Social History Narrative   Additional Social History:    Prescriptions: See MARs History of alcohol / drug use?: No history of alcohol / drug abuse                     Allergies:  No Known Allergies  Labs:  Results for orders placed or performed during the hospital encounter of 08/25/14 (from the past 48 hour(s))  CBC WITH DIFFERENTIAL     Status: None   Collection Time: 08/25/14 12:02 PM  Result Value Ref Range   WBC 9.3  4.0 - 10.5 K/uL   RBC 5.01 3.87 - 5.11 MIL/uL   Hemoglobin 14.6 12.0 - 15.0 g/dL   HCT 43.9 36.0 - 46.0 %   MCV 87.6 78.0 - 100.0 fL   MCH 29.1 26.0 - 34.0 pg   MCHC 33.3 30.0 - 36.0 g/dL   RDW 14.2 11.5 - 15.5 %   Platelets 198 150 - 400 K/uL   Neutrophils Relative % 66 43 - 77 %   Neutro Abs 6.1 1.7 - 7.7 K/uL   Lymphocytes Relative 26 12 - 46 %   Lymphs Abs 2.4 0.7 - 4.0 K/uL   Monocytes Relative 6 3 - 12 %   Monocytes Absolute 0.6 0.1 - 1.0 K/uL   Eosinophils Relative 2 0 - 5 %   Eosinophils Absolute 0.2 0.0 - 0.7 K/uL   Basophils Relative 0 0 - 1 %   Basophils Absolute 0.0 0.0 - 0.1 K/uL  Comprehensive metabolic panel     Status: Abnormal   Collection Time: 08/25/14 12:02  PM  Result Value Ref Range   Sodium 140 135 - 145 mmol/L   Potassium 4.1 3.5 - 5.1 mmol/L   Chloride 107 101 - 111 mmol/L   CO2 25 22 - 32 mmol/L   Glucose, Bld 100 (H) 65 - 99 mg/dL   BUN 12 6 - 20 mg/dL   Creatinine, Ser 0.82 0.44 - 1.00 mg/dL   Calcium 9.4 8.9 - 10.3 mg/dL   Total Protein 7.3 6.5 - 8.1 g/dL   Albumin 4.1 3.5 - 5.0 g/dL   AST 17 15 - 41 U/L   ALT 29 14 - 54 U/L   Alkaline Phosphatase 62 38 - 126 U/L   Total Bilirubin 0.5 0.3 - 1.2 mg/dL   GFR calc non Af Amer >60 >60 mL/min   GFR calc Af Amer >60 >60 mL/min    Comment: (NOTE) The eGFR has been calculated using the CKD EPI equation. This calculation has not been validated in all clinical situations. eGFR's persistently <60 mL/min signify possible Chronic Kidney Disease.    Anion gap 8 5 - 15  Ethanol     Status: None   Collection Time: 08/25/14 12:02 PM  Result Value Ref Range   Alcohol, Ethyl (B) <5 <5 mg/dL    Comment:        LOWEST DETECTABLE LIMIT FOR SERUM ALCOHOL IS 5 mg/dL FOR MEDICAL PURPOSES ONLY   Urine rapid drug screen (hosp performed)not at [facility]     Status: None   Collection Time: 08/25/14 12:24 PM  Result Value Ref Range   Opiates NONE DETECTED NONE DETECTED   Cocaine NONE DETECTED NONE DETECTED   Benzodiazepines NONE DETECTED NONE DETECTED   Amphetamines NONE DETECTED NONE DETECTED   Tetrahydrocannabinol NONE DETECTED NONE DETECTED   Barbiturates NONE DETECTED NONE DETECTED    Comment:        DRUG SCREEN FOR MEDICAL PURPOSES ONLY.  IF CONFIRMATION IS NEEDED FOR ANY PURPOSE, NOTIFY LAB WITHIN 5 DAYS.        LOWEST DETECTABLE LIMITS FOR URINE DRUG SCREEN Drug Class       Cutoff (ng/mL) Amphetamine      1000 Barbiturate      200 Benzodiazepine   096 Tricyclics       438 Opiates          300 Cocaine          300 THC  34   POC Urine Pregnancy, ED  (not at [facility])     Status: None   Collection Time: 08/25/14 12:33 PM  Result Value Ref Range   Preg Test, Ur NEGATIVE  NEGATIVE    Comment:        THE SENSITIVITY OF THIS METHODOLOGY IS >24 mIU/mL     Vitals: Blood pressure 111/57, pulse 53, temperature 98.5 F (36.9 C), temperature source Oral, resp. rate 16, SpO2 100 %.  Risk to Self: Suicidal Ideation: No-Not Currently/Within Last 6 Months Suicidal Intent: No-Not Currently/Within Last 6 Months Is patient at risk for suicide?: No Suicidal Plan?: No-Not Currently/Within Last 6 Months Access to Means: No What has been your use of drugs/alcohol within the last 12 months?: none How many times?: 0 Intentional Self Injurious Behavior: None Risk to Others: Homicidal Ideation: No-Not Currently/Within Last 6 Months Thoughts of Harm to Others: No-Not Currently Present/Within Last 6 Months Current Homicidal Intent: No-Not Currently/Within Last 6 Months Current Homicidal Plan: No-Not Currently/Within Last 6 Months Access to Homicidal Means: No History of harm to others?: Yes Assessment of Violence: In past 6-12 months Violent Behavior Description: verbal aggression towards family Does patient have access to weapons?: No Criminal Charges Pending?: No Does patient have a court date: No Prior Inpatient Therapy: Prior Inpatient Therapy: Yes Prior Therapy Dates: 2015 Prior [facility] Facilty[facility]Provider([facility] Reason for Treatment: Psychosis Prior Outpatient Therapy: Prior Outpatient Therapy: Yes Prior Therapy Dates: currently Prior Therapy Facilty/Provider(s): [facility] Reason for Treatment: Psychosis Does patient have an ACCT team?: Yes Does patient have Intensive In-House Services?  : No Does patient have [facility] services? : Yes Does patient have P4CC services?: No  Current Facility-Administered Medications  Medication Dose Route Frequency Provider Last Rate Last Dose  . benztropine (COGENTIN) tablet 1 mg  1 mg Oral Daily Dorie Rank, MD   1 mg at 08/25/14 1207  . carbamazepine (TEGRETOL XR) 12 hr tablet 200 mg  200 mg Oral BID Dorie Rank, MD   200 mg at 08/25/14  1207  . paliperidone (INVEGA) 24 hr tablet 6 mg  6 mg Oral Daily Dorie Rank, MD   6 mg at 08/25/14 1207  . traZODone (DESYREL) tablet 50 mg  50 mg Oral QHS PRN Dorie Rank, MD       Current Outpatient Prescriptions  Medication Sig Dispense Refill  . Paliperidone Palmitate 156 MG/ML SUSP Inject 1 mL (156 mg total) into the muscle every 30 (thirty) days. First dose of 234 mg received on 08/05/13 with next dose of 156 mg due in thirty days. (Patient not taking: Reported on 08/25/2014) 0.9 mL 3    Musculoskeletal: Strength & Muscle Tone: within normal limits Gait & Station: normal Patient leans: N/A  Psychiatric Specialty Exam: Physical Exam  Review of Systems  Constitutional: Negative.   Eyes: Negative.   Respiratory: Negative.   Cardiovascular: Negative.   Gastrointestinal: Negative.   Genitourinary: Negative.   Musculoskeletal: Negative.   Skin: Negative.   Neurological: Negative.   Endo/Heme/Allergies: Negative.     Blood pressure 111/57, pulse 53, temperature 98.5 F (36.9 C), temperature source Oral, resp. rate 16, SpO2 100 %.There is no weight on file to calculate BMI.  General Appearance: Casual and Disheveled  Eye Contact::  Poor  Speech:  Clear and Coherent and Slow  Volume:  Decreased  Mood:  Angry, Anxious, Depressed and Irritable  Affect:  Congruent  Thought Process:  Coherent  Orientation:  Full (Time, Place, and Person)  Thought Content:  Paranoid Ideation  Suicidal Thoughts:  No  Homicidal Thoughts:  No  Memory:  Immediate;   Poor Recent;   Poor Remote;   Poor  Judgement:  Poor  Insight:  Lacking  Psychomotor Activity:  Normal  Concentration:  Fair  Recall:  NA  Fund of Knowledge:Poor  Language: Poor  Akathisia:  NA  Handed:  Right  AIMS (if indicated):     Assets:  Desire for Improvement  ADL's:  Impaired  Cognition: WNL  Sleep:      Medical Decision Making: Review of Psycho-Social Stressors (1), Established Problem, Worsening (2), Review of  Medication Regimen & Side Effects (2) and Review of New Medication or Change in Dosage (2)  Treatment Plan Summary: Daily contact with patient to assess and evaluate symptoms and progress in treatment and Medication management  Plan: Resume all home medications, Vistaril 50 mg po every 8 hours as needed. Disposition:  Admit to inpatient Psychiatric unit, seek placement  Delfin Gant  PMHNP-BC 08/26/2014 1:06 PM Patient seen and I agree with treatment plan  Levonne Spiller M.D.

## 2014-08-26 NOTE — BH Assessment (Signed)
[facility] Assessment Progress Note   Referral material sent out to the following facilities: [facility], [facility], [facility], [facility], [facility], [facility].

## 2014-08-26 NOTE — ED Notes (Signed)
Pt. Noted sleeping in room. No complaints or concerns voiced. No distress or abnormal behavior noted. Will continue to monitor with security cameras. Q 15 minute rounds continue. 

## 2014-08-26 NOTE — ED Notes (Signed)
Report received from Janie Rambo RN. Pt. Sleeping, respirations regular and unlabored. Will continue to monitor for safety via security cameras and Q 15 minute checks. 

## 2014-08-26 NOTE — ED Notes (Signed)
Pt. Alert and oriented in no distress denies SI, HI, AVH and pain.  Pt. Instructed to come to me with problems or concerns.Will continue to monitor for safety via security cameras and Q 15 minute checks. 

## 2014-08-27 NOTE — ED Notes (Signed)
Pt. Noted sleeping in room. No complaints or concerns voiced. No distress or abnormal behavior noted. Will continue to monitor with security cameras. Q 15 minute rounds continue. 

## 2014-08-27 NOTE — BH Assessment (Signed)
[facility] Assessment Progress Note   Clinician received [facility]  They are declining pt due to non-compliance w/ medications.

## 2014-08-27 NOTE — ED Notes (Signed)
Report received from Janie Rambo RN. Pt. Alert and oriented in no distress denies SI, HI, AVH and pain.  Pt. Instructed to come to me with problems or concerns.Will continue to monitor for safety via security cameras and Q 15 minute checks. 

## 2014-08-27 NOTE — ED Notes (Signed)
Up to the bathroom 
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
## 2014-08-28 DIAGNOSIS — F200 Paranoid schizophrenia: Secondary | ICD-10-CM | POA: Diagnosis not present

## 2014-08-28 NOTE — Progress Notes (Signed)
[facility] by Dr. Zonia KiefStephens 66-2./ Rn Can call report to [phone]. Pt to be transported under IVC.   Olga CoasterKristen Cloie Wooden, LCSW  Clinical Social Work  [facility] [phone]

## 2014-08-28 NOTE — ED Notes (Signed)
Pt. Noted sleeping in room. No complaints or concerns voiced. No distress or abnormal behavior noted. Will continue to monitor with security cameras. Q 15 minute rounds continue. 

## 2014-08-28 NOTE — ED Notes (Signed)
Pt denies SI HI and AVH however she appears to be responding to internal stimuli.  She is isolating in her room and talking out loud but stops when someone looks through window.  She refused her Invega this am and said "It stops my menstrual cycle"  15 minute checks remain in place.

## 2014-08-28 NOTE — ED Notes (Signed)
Snack and beverage given. 

## 2014-08-28 NOTE — Progress Notes (Signed)
[facility], pt accepted for admission by Dr. Zonia KiefStephens. No bed assignment or report number available yet; Darlene states she will call when assignment is made and transfer can proceed.  CSW informed [facility] CSW.  Ilean SkillMeghan Latonia Conrow, MSW, LCSWA Clinical Social Work, Disposition  08/28/2014 [phone]

## 2014-08-28 NOTE — ED Notes (Signed)
Pt. Noted in room. No complaints or concerns voiced. No distress or abnormal behavior noted. Will continue to monitor with security cameras. Q 15 minute rounds continue. 

## 2014-08-28 NOTE — Consult Note (Addendum)
[facility] Face-to-Face Psychiatry Consult   Reason for Consult:  Paranoid Schizophrenia, acute exacerbation Referring Physician:  EDP Patient Identification: Angelica [name] MRN:  [id number] Principal Diagnosis: Paranoid schizophrenia, chronic condition with acute exacerbation Diagnosis:   Patient Active Problem List   Diagnosis Date Noted  . Paranoid schizophrenia, chronic condition with acute exacerbation [F20.0] 07/30/2013  . Psychosis [F29] 07/30/2013    Total Time spent with patient: 15 minutes  Subjective:   Angelica [name] is a 34 y.o. female patient admitted with Paranoid Schizophrenia, acute exacerbation.   On 08/28/2014, pt seen and chart reviewed with this NP and Dr. Jannifer Franklin. Pt continues to present as psychotic and is refusing all psych meds. Reality-testing impaired and warrants inpatient admission.   HPI: AA female, 34 years old was evaluated for medication non compliance and agitation.  She was was brought in by [facility] under IVC taken out by [facility].  Patient has an ACT team who initiated care for patient for not taking her medications for one year.  Patient exhibits a lot of anger at home where she lives with her two children.  It is documented that patient have been verbally abusive to family members and have not taken medications in one year.  This morning she stated that she has the right to not take medications.  Patient states she does not know why she is brought to the hospital and asked to be discharged home.   Patient denied suffering from Mental illness and denied ever taking any medications.  She did not want to answer most of our questions and stated"I am not seeing thing, hearing things and I am not a danger to myself".  Patient has been accepted for admission and we will be seeking placement at any facility with available beds.  Patient refused her invega and adamantly stated that she does not need medications.  Patient stated that she is not a threat to herself or anybody.   Patient refused a different type of Antipsychotic medication.  Patient denies SI/HI/AVH repeatedly called her mother and grandmother lier.  Patient lacks insight at this time.  We will continue to pursue plan of care and seek placement.   HPI Elements:   Location:  Paranoid Schizophrenia, exacerbation, medication non compliance. Quality:  severe. Severity:  severe. Timing:  acute. Duration:  Chronic mental illness. Context:  IVC by [facility] for medication non compliance and agitataion..  Past Medical History:  Past Medical History  Diagnosis Date  . No pertinent past medical history   . Medical history non-contributory   . Migraine   . Paranoid schizophrenia     Past Surgical History  Procedure Laterality Date  . Cyst removal neck    . Cyst removal neck  2005  . No past surgeries     Family History: No family history on file. Social History:  History  Alcohol Use No     History  Drug Use No    History   Social History  . Marital Status: Single    Spouse Name: N/A  . Number of Children: N/A  . Years of Education: N/A   Social History Main Topics  . Smoking status: Current Every Day Smoker -- 0.25 packs/day for 5 years    Types: Cigarettes  . Smokeless tobacco: Never Used  . Alcohol Use: No  . Drug Use: No  . Sexual Activity: Yes    Birth Control/ Protection: Condom   Other Topics Concern  . Not on file   Social History  Narrative   Additional Social History:    Prescriptions: See MARs History of alcohol / drug use?: No history of alcohol / drug abuse                     Allergies:  No Known Allergies  Labs:  No results found for this or any previous visit (from the past 48 hour(s)).  Vitals: Blood pressure 113/60, pulse 85, temperature 98.2 F (36.8 C), temperature source Oral, resp. rate 85, SpO2 100 %.  Risk to Self: Suicidal Ideation: No-Not Currently/Within Last 6 Months Suicidal Intent: No-Not Currently/Within Last 6 Months Is patient at  risk for suicide?: No Suicidal Plan?: No-Not Currently/Within Last 6 Months Access to Means: No What has been your use of drugs/alcohol within the last 12 months?: none How many times?: 0 Intentional Self Injurious Behavior: None Risk to Others: Homicidal Ideation: No-Not Currently/Within Last 6 Months Thoughts of Harm to Others: No-Not Currently Present/Within Last 6 Months Current Homicidal Intent: No-Not Currently/Within Last 6 Months Current Homicidal Plan: No-Not Currently/Within Last 6 Months Access to Homicidal Means: No History of harm to others?: Yes Assessment of Violence: In past 6-12 months Violent Behavior Description: verbal aggression towards family Does patient have access to weapons?: No Criminal Charges Pending?: No Does patient have a court date: No Prior Inpatient Therapy: Prior Inpatient Therapy: Yes Prior Therapy Dates: 2015 Prior Therapy Facilty/Provider(s): [facility] Reason for Treatment: Psychosis Prior Outpatient Therapy: Prior Outpatient Therapy: Yes Prior Therapy Dates: currently Prior Therapy Facilty/Provider(s): [facility] Reason for Treatment: Psychosis Does patient have an ACCT team?: Yes Does patient have Intensive In-House Services?  : No Does patient have [facility] services? : Yes Does patient have P4CC services?: No  Current Facility-Administered Medications  Medication Dose Route Frequency Provider Last Rate Last Dose  . benztropine (COGENTIN) tablet 1 mg  1 mg Oral Daily Linwood Dibbles, MD   1 mg at 08/28/14 1109  . carbamazepine (TEGRETOL XR) 12 hr tablet 200 mg  200 mg Oral BID Linwood Dibbles, MD   200 mg at 08/28/14 1109  . hydrOXYzine (ATARAX/VISTARIL) tablet 50 mg  50 mg Oral TID PRN Earney Navy, NP      . LORazepam (ATIVAN) tablet 1 mg  1 mg Oral Q8H PRN Earney Navy, NP      . paliperidone (INVEGA) 24 hr tablet 6 mg  6 mg Oral Daily Linwood Dibbles, MD   6 mg at 08/25/14 1207  . traZODone (DESYREL) tablet 50 mg  50 mg Oral QHS PRN Linwood Dibbles, MD        Current Outpatient Prescriptions  Medication Sig Dispense Refill  . Paliperidone Palmitate 156 MG/ML SUSP Inject 1 mL (156 mg total) into the muscle every 30 (thirty) days. First dose of 234 mg received on 08/05/13 with next dose of 156 mg due in thirty days. (Patient not taking: Reported on 08/25/2014) 0.9 mL 3    Musculoskeletal: Strength & Muscle Tone: within normal limits Gait & Station: normal Patient leans: N/A  Psychiatric Specialty Exam: Physical Exam  Review of Systems  Constitutional: Negative.   Eyes: Negative.   Respiratory: Negative.   Cardiovascular: Negative.   Gastrointestinal: Negative.   Genitourinary: Negative.   Musculoskeletal: Negative.   Skin: Negative.   Neurological: Negative.   Endo/Heme/Allergies: Negative.   Psychiatric/Behavioral: Positive for depression and hallucinations (paranoid ideation). The patient is nervous/anxious and has insomnia.   All other systems reviewed and are negative.   Blood pressure 113/60, pulse 85, temperature  98.2 F (36.8 C), temperature source Oral, resp. rate 85, SpO2 100 %.There is no weight on file to calculate BMI.  General Appearance: Casual and Disheveled  Eye Contact::  Poor  Speech:  Clear and Coherent and Slow  Volume:  Decreased  Mood:  Angry, Anxious, Depressed and Irritable  Affect:  Congruent  Thought Process:  Coherent  Orientation:  Full (Time, Place, and Person)  Thought Content:  Paranoid Ideation  Suicidal Thoughts:  No  Homicidal Thoughts:  No  Memory:  Immediate;   Poor Recent;   Poor Remote;   Poor  Judgement:  Poor  Insight:  Lacking  Psychomotor Activity:  Normal  Concentration:  Fair  Recall:  NA  Fund of Knowledge:Poor  Language: Poor  Akathisia:  NA  Handed:  Right  AIMS (if indicated):     Assets:  Desire for Improvement  ADL's:  Impaired  Cognition: WNL  Sleep:      Medical Decision Making: Review of Psycho-Social Stressors (1), Established Problem, Worsening (2), Review of  Medication Regimen & Side Effects (2) and Review of New Medication or Change in Dosage (2)  Treatment Plan Summary: Paranoid schizophrenia, chronic condition with acute exacerbation, worsening, unstable, warrants inpatient admission Daily contact with patient to assess and evaluate symptoms and progress in treatment and Medication management  Plan:  Inpatient -Continue Invega 6mg  daily for psychosis -Continue Tegretol XR 200mg  bid for mood stabilization -Continue Trazodone 50mg  qhs prn agitation/insomnia -Continue Cogentin 1mg  daily for EPS prophylactic   Disposition: -Inpatient psychiatric admission for safety and stabilization  Beau FannyWithrow, John C, FNP-BC  08/28/2014 12:49 PM Patient seen face-to-face for psychiatric evaluation, chart reviewed and case discussed with the physician extender and developed treatment plan. Reviewed the information documented and agree with the treatment plan. Thedore MinsMojeed Wayne Wicklund, MD

## 2016-02-16 ENCOUNTER — Emergency Department (HOSPITAL_COMMUNITY)
Admission: EM | Admit: 2016-02-16 | Discharge: 2016-02-17 | Disposition: A | Discharge status: Other Acute Inpatient Hospital | Payer: Medicaid Other | Attending: Emergency Medicine | Admitting: Emergency Medicine

## 2016-02-16 ENCOUNTER — Encounter (HOSPITAL_COMMUNITY): Payer: Self-pay

## 2016-02-16 DIAGNOSIS — F200 Paranoid schizophrenia: Secondary | ICD-10-CM | POA: Diagnosis present

## 2016-02-16 DIAGNOSIS — F29 Unspecified psychosis not due to a substance or known physiological condition: Secondary | ICD-10-CM

## 2016-02-16 DIAGNOSIS — R443 Hallucinations, unspecified: Secondary | ICD-10-CM

## 2016-02-16 DIAGNOSIS — F17210 Nicotine dependence, cigarettes, uncomplicated: Secondary | ICD-10-CM | POA: Insufficient documentation

## 2016-02-16 DIAGNOSIS — Z79899 Other long term (current) drug therapy: Secondary | ICD-10-CM | POA: Insufficient documentation

## 2016-02-16 LAB — ETHANOL: Alcohol, Ethyl (B): 145 mg/dL — ABNORMAL HIGH (ref <5)

## 2016-02-16 LAB — CBC WITH DIFFERENTIAL/PLATELET
Basophils Absolute: 0 10*3/uL (ref 0.0–0.1)
Basophils Relative: 0 %
EOS ABS: 0.4 10*3/uL (ref 0.0–0.7)
Eosinophils Relative: 2 %
HCT: 40.1 % (ref 36.0–46.0)
HEMOGLOBIN: 13.7 g/dL (ref 12.0–15.0)
LYMPHS PCT: 18 %
Lymphs Abs: 3.1 10*3/uL (ref 0.7–4.0)
MCH: 29.1 pg (ref 26.0–34.0)
MCHC: 34.2 g/dL (ref 30.0–36.0)
MCV: 85.3 fL (ref 78.0–100.0)
Monocytes Absolute: 1.1 10*3/uL — ABNORMAL HIGH (ref 0.1–1.0)
Monocytes Relative: 6 %
NEUTROS ABS: 12.6 10*3/uL — AB (ref 1.7–7.7)
Neutrophils Relative %: 74 %
Platelets: 228 10*3/uL (ref 150–400)
RBC: 4.7 MIL/uL (ref 3.87–5.11)
RDW: 13.6 % (ref 11.5–15.5)
WBC: 17.2 10*3/uL — AB (ref 4.0–10.5)

## 2016-02-16 LAB — SALICYLATE LEVEL: Salicylate Lvl: <7 mg/dL (ref 2.8–30.0)

## 2016-02-16 LAB — BASIC METABOLIC PANEL
Anion gap: 10 (ref 5–15)
BUN: 17 mg/dL (ref 6–20)
CHLORIDE: 105 mmol/L (ref 101–111)
CO2: 21 mmol/L — ABNORMAL LOW (ref 22–32)
Calcium: 8.7 mg/dL — ABNORMAL LOW (ref 8.9–10.3)
Creatinine, Ser: 0.69 mg/dL (ref 0.44–1.00)
GFR calc Af Amer: >60 mL/min (ref >60)
GFR calc non Af Amer: >60 mL/min (ref >60)
Glucose, Bld: 99 mg/dL (ref 65–99)
POTASSIUM: 3.3 mmol/L — AB (ref 3.5–5.1)
Sodium: 136 mmol/L (ref 135–145)

## 2016-02-16 LAB — ACETAMINOPHEN LEVEL

## 2016-02-16 LAB — RAPID URINE DRUG SCREEN, HOSP PERFORMED
AMPHETAMINES: NOT DETECTED
Barbiturates: NOT DETECTED
Benzodiazepines: POSITIVE — AB
COCAINE: NOT DETECTED
OPIATES: NOT DETECTED
TETRAHYDROCANNABINOL: NOT DETECTED

## 2016-02-16 LAB — CBG MONITORING, ED: Glucose-Capillary: 84 mg/dL (ref 65–99)

## 2016-02-16 MED ORDER — POTASSIUM CHLORIDE CRYS ER 20 MEQ PO TBCR
40.0000 meq | EXTENDED_RELEASE_TABLET | Freq: Once | ORAL | Status: AC
  Administered 2016-02-16: 40 meq via ORAL
  Filled 2016-02-16: qty 2

## 2016-02-16 MED ORDER — HALOPERIDOL LACTATE 5 MG/ML IJ SOLN: 10.0000 mg | Freq: Once | INTRAMUSCULAR | Status: DC | PRN

## 2016-02-16 MED ORDER — HYDROXYZINE HCL 25 MG PO TABS
25.0000 mg | ORAL_TABLET | Freq: Two times a day (BID) | ORAL | Status: DC
  Administered 2016-02-16 – 2016-02-17 (×2): 25 mg via ORAL
  Filled 2016-02-16 (×2): qty 1

## 2016-02-16 MED ORDER — PALIPERIDONE ER 6 MG PO TB24
6.0000 mg | ORAL_TABLET | Freq: Every day | ORAL | Status: DC
  Filled 2016-02-16 (×2): qty 1

## 2016-02-16 MED ORDER — TRAZODONE HCL 100 MG PO TABS
100.0000 mg | ORAL_TABLET | Freq: Every day | ORAL | Status: DC
  Administered 2016-02-16: 100 mg via ORAL
  Filled 2016-02-16: qty 1

## 2016-02-16 MED ORDER — SODIUM CHLORIDE 0.9 % IV BOLUS (SEPSIS): 1000.0000 mL | Freq: Once | INTRAVENOUS | Status: DC

## 2016-02-16 MED ORDER — SODIUM CHLORIDE 0.9 % IV BOLUS (SEPSIS)
2000.0000 mL | Freq: Once | INTRAVENOUS | Status: AC
  Administered 2016-02-16: 2000 mL via INTRAVENOUS

## 2016-02-16 MED ORDER — LORAZEPAM 2 MG/ML IJ SOLN
2.0000 mg | Freq: Once | INTRAMUSCULAR | Status: AC | PRN
  Administered 2016-02-16: 2 mg via INTRAMUSCULAR
  Filled 2016-02-16: qty 1

## 2016-02-16 MED ORDER — ZIPRASIDONE HCL 20 MG PO CAPS
60.0000 mg | ORAL_CAPSULE | Freq: Two times a day (BID) | ORAL | Status: DC
  Administered 2016-02-16 – 2016-02-17 (×2): 60 mg via ORAL
  Filled 2016-02-16 (×2): qty 3

## 2016-02-16 NOTE — BH Assessment (Signed)
Attempted assessment. Pt is asleep and would not wake up to answer questions. TCU staff will notify TTS when Pt is able to participate in assessment.   Harlin RainFord Ellis Patsy BaltimoreWarrick Jr, LPC, [facility], [facility] Triage Specialist [phone]

## 2016-02-16 NOTE — ED Notes (Signed)
Patient denies pain and is resting comfortably.  

## 2016-02-16 NOTE — ED Notes (Signed)
Patient placed in paper scrubs, wanded and personal belongings placed in locker.

## 2016-02-16 NOTE — ED Notes (Signed)
Denies auditory and visual hallucinations, noted scratches on right side of neck. Patient requesting pain medications, oxy, hydrocodone, and states that she needs valium for her nerves.

## 2016-02-16 NOTE — Progress Notes (Signed)
Patient has been [facility] for inpatient [facility]: [facility], [place], [facility], [facility].  Melbourne Abtsatia Cleston Lautner, LCSWA Disposition staff 02/16/2016 4:26 PM

## 2016-02-16 NOTE — ED Notes (Signed)
Patient urinated in hat.  Specimen sent.  In and out not done.

## 2016-02-16 NOTE — ED Notes (Signed)
Bed: WTR8 Expected date:  Expected time:  Means of arrival:  Comments: 

## 2016-02-16 NOTE — Progress Notes (Signed)
Patient refused haldol at this time, states I need valium and the pain medication like oxy, hydrocodone.  States that no one else is in the room besides the nurse and herself, continues to deny auditory hallucinations

## 2016-02-16 NOTE — ED Notes (Signed)
Bed: [id number] Expected date:  Expected time:  Means of arrival:  Comments: Handy
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
## 2016-02-16 NOTE — Progress Notes (Signed)
CSW filed patient's first examination paperwork in IVC log book.  

## 2016-02-16 NOTE — ED Provider Notes (Signed)
Per TTS, they plan on admitting the patient and her seeking placement at this time   Mancel BaleElliott Brinton Brandel, MD 02/16/16 1128
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
## 2016-02-16 NOTE — ED Notes (Signed)
Patient observed in room continuously having a conversation in her room. She is observed laughing at times also. She is in the room alone.

## 2016-02-16 NOTE — ED Notes (Signed)
Patient voided at this time and she dumped the urine. Patient also reports that she is on her menses.
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
## 2016-02-17 ENCOUNTER — Inpatient Hospital Stay (HOSPITAL_COMMUNITY)
Admission: AD | Admit: 2016-02-17 | Discharge: 2016-02-27 | DRG: 885 | Disposition: A | Discharge status: Home / Self Care | Payer: Medicaid Other | Attending: Psychiatry | Admitting: Psychiatry

## 2016-02-17 ENCOUNTER — Encounter (HOSPITAL_COMMUNITY): Payer: Self-pay

## 2016-02-17 DIAGNOSIS — G43909 Migraine, unspecified, not intractable, without status migrainosus: Secondary | ICD-10-CM | POA: Diagnosis present

## 2016-02-17 DIAGNOSIS — Z79899 Other long term (current) drug therapy: Secondary | ICD-10-CM

## 2016-02-17 DIAGNOSIS — F29 Unspecified psychosis not due to a substance or known physiological condition: Secondary | ICD-10-CM | POA: Diagnosis not present

## 2016-02-17 DIAGNOSIS — Z9889 Other specified postprocedural states: Secondary | ICD-10-CM

## 2016-02-17 DIAGNOSIS — F17210 Nicotine dependence, cigarettes, uncomplicated: Secondary | ICD-10-CM

## 2016-02-17 DIAGNOSIS — F23 Brief psychotic disorder: Secondary | ICD-10-CM | POA: Diagnosis present

## 2016-02-17 DIAGNOSIS — Z888 Allergy status to other drugs, medicaments and biological substances status: Secondary | ICD-10-CM

## 2016-02-17 DIAGNOSIS — F419 Anxiety disorder, unspecified: Secondary | ICD-10-CM | POA: Diagnosis present

## 2016-02-17 DIAGNOSIS — F200 Paranoid schizophrenia: Secondary | ICD-10-CM | POA: Diagnosis present

## 2016-02-17 DIAGNOSIS — Z7289 Other problems related to lifestyle: Secondary | ICD-10-CM

## 2016-02-17 MED ORDER — PALIPERIDONE ER 6 MG PO TB24
6.0000 mg | ORAL_TABLET | Freq: Every day | ORAL | Status: DC
  Administered 2016-02-17: 6 mg via ORAL
  Filled 2016-02-17: qty 1

## 2016-02-17 MED ORDER — HYDROXYZINE HCL 25 MG PO TABS
25.0000 mg | ORAL_TABLET | Freq: Four times a day (QID) | ORAL | Status: DC | PRN
  Administered 2016-02-17 – 2016-02-27 (×8): 25 mg via ORAL
  Filled 2016-02-17 (×8): qty 1

## 2016-02-17 MED ORDER — ACETAMINOPHEN 325 MG PO TABS
650.0000 mg | ORAL_TABLET | Freq: Four times a day (QID) | ORAL | Status: DC | PRN
  Administered 2016-02-17 – 2016-02-27 (×14): 650 mg via ORAL
  Filled 2016-02-17 (×14): qty 2

## 2016-02-17 MED ORDER — ZIPRASIDONE MESYLATE 20 MG IM SOLR: 20.0000 mg | Freq: Once | INTRAMUSCULAR | Status: DC | PRN

## 2016-02-17 MED ORDER — DIPHENHYDRAMINE HCL 50 MG/ML IJ SOLN: 50.0000 mg | Freq: Once | INTRAMUSCULAR | Status: DC | PRN

## 2016-02-17 MED ORDER — LORAZEPAM 2 MG/ML IJ SOLN: 2.0000 mg | Freq: Once | INTRAMUSCULAR | Status: DC | PRN

## 2016-02-17 MED ORDER — TRAZODONE HCL 50 MG PO TABS
50.0000 mg | ORAL_TABLET | Freq: Every evening | ORAL | Status: DC | PRN
  Administered 2016-02-17 – 2016-02-20 (×4): 50 mg via ORAL
  Filled 2016-02-17 (×4): qty 1

## 2016-02-17 MED ORDER — LORAZEPAM 1 MG PO TABS
2.0000 mg | ORAL_TABLET | Freq: Once | ORAL | Status: DC | PRN
Start: 2016-02-17 — End: 2016-02-18

## 2016-02-17 MED ORDER — ZIPRASIDONE HCL 60 MG PO CAPS
60.0000 mg | ORAL_CAPSULE | Freq: Two times a day (BID) | ORAL | Status: DC
  Administered 2016-02-17 – 2016-02-19 (×4): 60 mg via ORAL
  Filled 2016-02-17: qty 1
  Filled 2016-02-17: qty 3
  Filled 2016-02-17 (×5): qty 1

## 2016-02-17 MED ORDER — DIPHENHYDRAMINE HCL 25 MG PO CAPS: 50.0000 mg | ORAL_CAPSULE | Freq: Once | ORAL | Status: DC | PRN

## 2016-02-17 MED ORDER — PALIPERIDONE ER 6 MG PO TB24
6.0000 mg | ORAL_TABLET | Freq: Every day | ORAL | Status: DC
Start: 2016-02-18 — End: 2016-02-18
  Administered 2016-02-18: 6 mg via ORAL
  Filled 2016-02-17 (×2): qty 1

## 2016-02-17 MED ORDER — POTASSIUM CHLORIDE CRYS ER 20 MEQ PO TBCR
20.0000 meq | EXTENDED_RELEASE_TABLET | Freq: Once | ORAL | Status: AC
  Administered 2016-02-17: 20 meq via ORAL
  Filled 2016-02-17: qty 1

## 2016-02-17 MED ORDER — ALUM & MAG HYDROXIDE-SIMETH 200-200-20 MG/5ML PO SUSP
30.0000 mL | ORAL | Status: DC | PRN
  Administered 2016-02-18 – 2016-02-24 (×2): 30 mL via ORAL
  Filled 2016-02-17 (×2): qty 30

## 2016-02-17 MED ORDER — MAGNESIUM HYDROXIDE 400 MG/5ML PO SUSP: 30.0000 mL | Freq: Every day | ORAL | Status: DC | PRN

## 2016-02-17 NOTE — ED Notes (Signed)
Psychiatry at bedside.

## 2016-02-17 NOTE — BHH Counselor (Signed)
[facility] Assessment Progress Note  [facility] In[facility] Fermoina[facility] Pt can come at 1500. Call report to [phone]. Pt's RN, Rayfield Citizenaroline, notified.   Johny ShockSamantha M. Ladona Ridgelaylor, MS, NCC, LPCA Counselor

## 2016-02-17 NOTE — Tx Team (Signed)
Initial Treatment Plan 02/17/2016 6:28 PM Angelica HartMaria Antoinette [name] [id number]    PATIENT STRESSORS: Marital or family conflict Medication change or noncompliance Substance abuse   PATIENT STRENGTHS: Average or above average intelligence Communication skills Supportive family/friends   PATIENT IDENTIFIED PROBLEMS: Substance abuse  Anxiety  "Be discharged"  "Be happy"               DISCHARGE CRITERIA:  Ability to meet basic life and health needs Improved stabilization in mood, thinking, and/or behavior Medical problems require only outpatient monitoring Reduction of life-threatening or endangering symptoms to within safe limits  PRELIMINARY DISCHARGE PLAN: Attend aftercare/continuing care group Attend 12-step recovery group Outpatient therapy Participate in family therapy Return to previous living arrangement  PATIENT/FAMILY INVOLVEMENT: This treatment plan has been presented to and reviewed with the patient, Angelica RosebushMaria Antoinette [name], and/or family member.  The patient and family have been given the opportunity to ask questions and make suggestions.  Bethann PunchesJane O Hiroto Saltzman, RN 02/17/2016, 6:28 PM

## 2016-02-17 NOTE — ED Notes (Addendum)
Pt had 2 phone calls as of 1050

## 2016-02-17 NOTE — ED Notes (Signed)
Pt just made 3rd and last phone call for the day

## 2016-02-17 NOTE — Progress Notes (Signed)
CSW contacted by employee named Leotis ShamesJeffery from [facility] [phone]) in regards to patient referral, Leotis ShamesJeffery left voice message requesting return phone call. CSW returned phone call, no answer and no option to leave voice message. [facility] contacted [facility] [phone]) employee transferred CSW to [place]. Leotis ShamesJeffery inquired about patient's ability to return home after being discharged from the facility and whether patient has required any restraints, seclusions or IM medications in the past 24 hours. CSW agreed to return call to [place] with requested information.   CSW contacted patient's mother Francesca Oman(Denice Madilyn FiremanHayes [phone]) to inquire about patient's living arrangements after discharge. Patient's mother reported that patient will be returning home after being discharged from facility. Patient's mother reported that [facility] and has [facility] been [facility]. Patient's mother also reports that patient does better with shots versus pills for her medication. CSW spoke with patient's RN regarding restraints, seclusions and IM medications within the past 24 hours. Patient's RN reported that patient has not had any restraints, seclusions or IM medications in the past 24 hours that she is aware of. CSW contacted Leotis ShamesJeffery and provided requested information. Leotis ShamesJeffery reported that he would contact CSW if any beds became available.

## 2016-02-17 NOTE — ED Notes (Signed)
Discharge note:  [facility] by [employer] [facility].  Patient is being admitted to 505[facility]1.  Report given to Zada FindersJane O., RN.  Patient was given K+ due low result.  She has been compliant with her medications.  Patient has been continuously talking to herself since [place].  Patient has not expressed any thoughts of self harm.  Patient has been cooperative; no behavior problems.  Patient left ambulatory with [employer].

## 2016-02-17 NOTE — Progress Notes (Signed)
Patient did not attend evening group. 

## 2016-02-17 NOTE — Progress Notes (Signed)
D Pt. Denies SI and HI, no complaints of pain or discomfort noted at present time.  A Writer offered support and encouragement, attempted to discuss pt.'s day.  R Pt. Has been in bed since Clinical research associatewriter arrived.  Pt. Did give short responses and accepted her hour of sleep medications.  Pt. Remains safe on the unit.
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
## 2016-02-17 NOTE — Progress Notes (Signed)
CSW contacted by patient's mother. Patient's mother reported that patient contacted her and is trying to come home. Patient's mother reported that the patient needs treatment and is not ready to return home. CSW informed patient's mother that patient is under IVC and that CSW is still working to seek inpatient hospitalization for patient. Patient's mother requested to be contacted with any further updates.

## 2016-02-17 NOTE — Progress Notes (Signed)
CSW contacted patient's mother to update her on patient's acceptance to [facility]. Patient's mother thanked CSW for information.
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
## 2016-02-18 ENCOUNTER — Encounter (HOSPITAL_COMMUNITY): Payer: Self-pay | Admitting: Psychiatry

## 2016-02-18 DIAGNOSIS — Z79899 Other long term (current) drug therapy: Secondary | ICD-10-CM

## 2016-02-18 DIAGNOSIS — Z888 Allergy status to other drugs, medicaments and biological substances status: Secondary | ICD-10-CM

## 2016-02-18 DIAGNOSIS — F200 Paranoid schizophrenia: Principal | ICD-10-CM

## 2016-02-18 LAB — HEPATIC FUNCTION PANEL
ALBUMIN: 3.3 g/dL — AB (ref 3.5–5.0)
ALK PHOS: 46 U/L (ref 38–126)
ALT: 21 U/L (ref 14–54)
AST: 15 U/L (ref 15–41)
Bilirubin, Direct: <0.1 mg/dL — ABNORMAL LOW (ref 0.1–0.5)
Total Bilirubin: 0.3 mg/dL (ref 0.3–1.2)
Total Protein: 6 g/dL — ABNORMAL LOW (ref 6.5–8.1)

## 2016-02-18 LAB — LIPID PANEL
CHOL/HDL RATIO: 4.8 ratio
Cholesterol: 159 mg/dL (ref 0–200)
HDL: 33 mg/dL — ABNORMAL LOW (ref >40)
LDL Cholesterol: 99 mg/dL (ref 0–99)
Triglycerides: 133 mg/dL (ref <150)
VLDL: 27 mg/dL (ref 0–40)

## 2016-02-18 LAB — TSH: TSH: 1.475 u[IU]/mL (ref 0.350–4.500)

## 2016-02-18 MED ORDER — OLANZAPINE 10 MG PO TBDP
10.0000 mg | ORAL_TABLET | Freq: Three times a day (TID) | ORAL | Status: DC | PRN
Start: 2016-02-18 — End: 2016-02-28
  Administered 2016-02-21 – 2016-02-26 (×6): 10 mg via ORAL
  Filled 2016-02-18 (×6): qty 1

## 2016-02-18 MED ORDER — IBUPROFEN 600 MG PO TABS
600.0000 mg | ORAL_TABLET | Freq: Four times a day (QID) | ORAL | Status: DC | PRN
  Administered 2016-02-18 – 2016-02-26 (×6): 600 mg via ORAL
  Filled 2016-02-18 (×6): qty 1

## 2016-02-18 MED ORDER — OLANZAPINE 10 MG IM SOLR: 10.0000 mg | Freq: Three times a day (TID) | INTRAMUSCULAR | Status: DC | PRN

## 2016-02-18 MED ORDER — LORAZEPAM 1 MG PO TABS
1.0000 mg | ORAL_TABLET | ORAL | Status: DC | PRN
  Administered 2016-02-18 – 2016-02-19 (×2): 1 mg via ORAL
  Filled 2016-02-18 (×2): qty 1

## 2016-02-18 NOTE — Progress Notes (Signed)
D Pt. Denies SI and HI,  complaints of a headache and  that was not relieved with tylenol and a high anxity level this pm. Denies AH today , was noted the prior night talking to people she believed to be in the room with her, none observed yet this pm..  A Writer offered support and encouragement, talked with the PA to get an order for ibuprofen, also administered ativan for the anxiety.  R Pt. Remains safe on the unit and is now resting quietly in her room.

## 2016-02-18 NOTE — Plan of Care (Signed)
Problem: Safety: Goal: Ability to remain free from injury will improve Outcome: Progressing No self injurious behavior observed or expressed  Problem: Health Behavior/Discharge Planning: Goal: Identification of resources available to assist in meeting health care needs will improve Outcome: Not Progressing Pt is actively psychotic unable to identify resources at this time  Problem: Medication: Goal: Compliance with prescribed medication regimen will improve Outcome: Progressing Pt has taken medication as prescribed on the unit today.

## 2016-02-18 NOTE — [facility] Group Notes (Signed)
[facility] Note   02/18/2016 3:45 PM  Participation Quality:    [name] came briefly, left and did not return.  Plan for Discharge/Comments:    Transportation Means:   Supports:  Ida Rogueodney B Wendie Diskin
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
## 2016-02-18 NOTE — Tx Team (Signed)
Interdisciplinary Treatment and Diagnostic Plan Update  02/18/2016 Time of Session: 3:45 PM  Angelica [name] MRN: [id number]  Principal Diagnosis: Schizophrenia, paranoid, chronic with acute exacerbation ([place])  Secondary Diagnoses: Principal Problem:   Schizophrenia, paranoid, chronic with acute exacerbation ([place])   Current Medications:  Current Facility-Administered Medications  Medication Dose Route Frequency Provider Last Rate Last Dose  . acetaminophen (TYLENOL) tablet 650 mg  650 mg Oral Q6H PRN Ethelene Hal, NP   650 mg at 02/17/16 1855  . alum & mag hydroxide-simeth (MAALOX/MYLANTA) 200-200-20 MG/5ML suspension 30 mL  30 mL Oral Q4H PRN Ethelene Hal, NP      . hydrOXYzine (ATARAX/VISTARIL) tablet 25 mg  25 mg Oral Q6H PRN Ethelene Hal, NP   25 mg at 02/17/16 2301  . LORazepam (ATIVAN) tablet 1 mg  1 mg Oral Q4H PRN Ursula Alert, MD      . magnesium hydroxide (MILK OF MAGNESIA) suspension 30 mL  30 mL Oral Daily PRN Ethelene Hal, NP      . OLANZapine zydis (ZYPREXA) disintegrating tablet 10 mg  10 mg Oral TID PRN Ursula Alert, MD       Or  . OLANZapine (ZYPREXA) injection 10 mg  10 mg Intramuscular TID PRN Ursula Alert, MD      . traZODone (DESYREL) tablet 50 mg  50 mg Oral QHS PRN Ethelene Hal, NP   50 mg at 02/17/16 2301  . ziprasidone (GEODON) capsule 60 mg  60 mg Oral BID WC Ethelene Hal, NP   60 mg at 02/18/16 6629    PTA Medications: Prescriptions Prior to Admission  Medication Sig Dispense Refill Last Dose  . Paliperidone Palmitate 156 MG/ML SUSP Inject 1 mL (156 mg total) into the muscle every 30 (thirty) days. First dose of 234 mg received on 08/05/13 with next dose of 156 mg due in thirty days. (Patient not taking: Reported on 02/16/2016) 0.9 mL 3 Not Taking at Unknown time  . ziprasidone (GEODON) 60 MG capsule Take 60 mg by mouth 2 (two) times daily with a meal.       Treatment Modalities: Medication  Management, Group therapy, Case management,  1 to 1 session with clinician, Psychoeducation, Recreational therapy.   Physician Treatment Plan for Primary Diagnosis: Schizophrenia, paranoid, chronic with acute exacerbation ([place]) Long Term Goal(s): Improvement in symptoms so as ready for discharge  Short Term Goals: Ability to verbalize feelings will improve   Medication Management: Evaluate patient's response, side effects, and tolerance of medication regimen.  Therapeutic Interventions: 1 to 1 sessions, Unit Group sessions and Medication administration.  Evaluation of Outcomes: Progressing  Physician Treatment Plan for Secondary Diagnosis: Principal Problem:   Schizophrenia, paranoid, chronic with acute exacerbation ([place])   Long Term Goal(s): Improvement in symptoms so as ready for discharge  Short Term Goals:  Compliance with prescribed medications will improve  Medication Management: Evaluate patient's response, side effects, and tolerance of medication regimen.  Therapeutic Interventions: 1 to 1 sessions, Unit Group sessions and Medication administration.  Evaluation of Outcomes: Progressing   RN Treatment Plan for Primary Diagnosis: Schizophrenia, paranoid, chronic with acute exacerbation ([place]) Long Term Goal(s): Knowledge of disease and therapeutic regimen to maintain health will improve  Short Term Goals: Ability to verbalize frustration and anger appropriately will improve and Compliance with prescribed medications will improve  Medication Management: RN will administer medications as ordered by provider, will assess and evaluate patient's response and provide education to patient for prescribed medication. RN  will report any adverse and/or side effects to prescribing provider.  Therapeutic Interventions: 1 on 1 counseling sessions, Psychoeducation, Medication administration, Evaluate responses to treatment, Monitor vital signs and CBGs as ordered, Perform/monitor CIWA,  COWS, AIMS and Fall Risk screenings as ordered, Perform wound care treatments as ordered.  Evaluation of Outcomes: Progressing   Recreational Therapy Treatment Plan for Primary Diagnosis: Schizophrenia, paranoid, chronic with acute exacerbation ([place]) Long Term Goal(s):  LTG- Patient will participate in recreation therapy tx in at least 2 group sessions without prompting from LRT.  Short Term Goals: Pt will be able to identify at least 5 coping skills for admitting dx by conclusion of recreation therapy tx.  Treatment Modalities: Group and Pet Therapy  Therapeutic Interventions: Psychoeducation  Evaluation of Outcomes: Progressing   LCSW Treatment Plan for Primary Diagnosis: Schizophrenia, paranoid, chronic with acute exacerbation ([place]) Long Term Goal(s): Safe transition to appropriate next level of care at discharge, Engage patient in therapeutic group addressing interpersonal concerns.  Short Term Goals: Engage patient in aftercare planning with referrals and resources  Therapeutic Interventions: Assess for all discharge needs, 1 to 1 time with Social worker, Explore available resources and support systems, Assess for adequacy in community support network, Educate family and significant other(s) on suicide prevention, Complete Psychosocial Assessment, Interpersonal group therapy.  Evaluation of Outcomes: Met   Progress in Treatment: Attending groups: No Participating in groups: No Taking medication as prescribed: Yes Toleration medication: Yes, no side effects reported at this time Family/Significant other contact made: Yes Patient understands diagnosis: No  Limited insight Discussing patient identified problems/goals with staff: Yes Medical problems stabilized or resolved: Yes Denies suicidal/homicidal ideation: Yes Issues/concerns per patient self-inventory: None Other: N/A  New problem(s) identified: None identified at this time.   New Short Term/Long Term Goal(s): None  identified at this time.   Discharge Plan or Barriers: Return home, follow up outpt  Reason for Continuation of Hospitalization: Paranoia Delusions  Aggression Hallucinations  Medication stabilization   Estimated Length of Stay: 3-5 days  Attendees: Patient: 02/18/2016  3:45 PM  Physician: Ursula Alert, MD 02/18/2016  3:45 PM  Nursing: Hoy Register, RN 02/18/2016  3:45 PM  RN Care Manager: Lars Pinks, RN 02/18/2016  3:45 PM  Social Worker: Ripley Fraise 02/18/2016  3:45 PM  Recreational Therapist: Laretta Bolster  02/18/2016  3:45 PM  Other: Norberto Sorenson 02/18/2016  3:45 PM  Other:  02/18/2016  3:45 PM    Scribe for Treatment Team:  Roque Lias LCSW 02/18/2016 3:45 PM

## 2016-02-18 NOTE — [facility] Suicide Risk Assessment (Signed)
[facility]   Nursing information obtained from:    Demographic factors:    Current Mental Status:    Loss Factors:    Historical Factors:    Risk Reduction Factors:     Total Time spent with patient: 30 minutes Principal Problem: Schizophrenia, paranoid, chronic with acute exacerbation ([facility]) Diagnosis:   Patient Active Problem List   Diagnosis Date Noted  . Schizophrenia, paranoid, chronic with acute exacerbation ([facility]) [F20.0] 02/17/2016   Subjective Data: Please see H&P.   Continued Clinical Symptoms:  Alcohol Use Disorder Identification Test Final Score (AUDIT): 4 The "Alcohol Use Disorders Identification Test", Guidelines for Use in Primary Care, Second Edition.  World Science writerHealth [employer] [facility](WHO). Score between 0-7:  no or low risk or alcohol related problems. Score between 8-15:  moderate risk of alcohol related problems. Score between 16-19:  high risk of alcohol related problems. Score 20 or above:  warrants further diagnostic evaluation for alcohol dependence and treatment.   CLINICAL FACTORS:   Schizophrenia:   Less than 35 years old Paranoid or undifferentiated type Previous Psychiatric Diagnoses and Treatments   Musculoskeletal: Strength & Muscle Tone: within normal limits Gait & Station: normal Patient leans: N/A  Psychiatric Specialty Exam: Physical Exam  Review of Systems  Psychiatric/Behavioral: The patient is nervous/anxious.   All other systems reviewed and are negative.   Blood pressure 124/66, pulse 79, temperature 98.6 F (37 C), temperature source Oral, resp. rate 20, height 5' 3.75" (1.619 m), weight 96.2 kg (212 lb), last menstrual period 02/15/2016.Body mass index is 36.68 kg/m.  Please see H&P.                                                   Sleep:  Number of Hours: 5.25      COGNITIVE FEATURES THAT CONTRIBUTE TO RISK:  Closed-mindedness, Polarized thinking and Thought constriction  (tunnel vision)    SUICIDE RISK:   Mild:  Suicidal ideation of limited frequency, intensity, duration, and specificity.  There are no identifiable plans, no associated intent, mild dysphoria and related symptoms, good self-control (both objective and subjective assessment), few other risk factors, and identifiable protective factors, including available and accessible social support.   PLAN OF CARE: Please see H&P.   I certify that inpatient services furnished can reasonably be expected to improve the patient's condition.  Prayan Ulin, MD 02/18/2016, 12:26 PM
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
## 2016-02-18 NOTE — Progress Notes (Signed)
While this nurse was in the hall taking another patients blood pressure, this pt looked at this nurse and stated in a low angry tone, "Hoe bitches trying to be nurses." This nurse did not respond. Pt then went to dinner without further incident.

## 2016-02-18 NOTE — Progress Notes (Signed)
Pt rated her day a 8 out of 10. Pt wants to talk with doctor tomorrow to see what it is she need to do to get released. Pt had snack this evening and is currently in the dayroom interacting with peers.

## 2016-02-18 NOTE — H&P (Addendum)
Psychiatric Admission Assessment Adult  Patient Identification: Angelica [name] MRN:  [id number] Date of Evaluation:  02/18/2016 Chief Complaint: Pt states " I just raised my voice , that's all. "    Principal Diagnosis: Schizophrenia, paranoid, chronic with acute exacerbation (HCC) Diagnosis:   Patient Active Problem List   Diagnosis Date Noted  . Schizophrenia, paranoid, chronic with acute exacerbation (HCC) [F20.0] 02/17/2016   History of Present Illness: LAURENE [name] is a  36 y.o.AA female, who is single , unemployed who has a hx of schizophrenia , who lives in [place] with her mother , presented IVC ed for aggressive behavior.  Patient seen and chart reviewed.Discussed patient with treatment team.  Pt today seen as irritable , paranoid. Pt was superficially cooperative , however answered most questions in short answers or monosyllables. Pt reports that she is fine. Pt reports she got IVced for raising her voice . Pt reports she was not aggressive other than being loud. Pt reports she was compliant on geodon. Pt states she wants to stay on it since that works. Pt does not want to be on Invega since it caused her to have akathisia in the past. Pt is not willing to discuss any other medications at this time other than the geodon. Pt reports she is against LAI medications and does not want them. Pt denies any other concerns.Pt denies substance abuse - alcohol abuse - but her BAL - was 145, uds -pos for BZD - likely from ED- prt denies abusing BZD.  Per review of EHR - pt was last admitted to [facility] in 2014 , 2015. Pt during her 2015 admission was discharged on Invega sustenna.     Associated Signs/Symptoms: Depression Symptoms:  DENIES (Hypo) Manic Symptoms:  DENIES- but is irritable, labile Anxiety Symptoms:  anxious - unspecified Psychotic Symptoms:  appears paranoid, suspicipus PTSD Symptoms: Negative Total Time spent with patient: 45 minutes  Past Psychiatric History: Pt with  hx of schizophrenia , follows up with [facility] , denies hx of suicide attempts, admitted to [facility] in 2014, 2015.  Is the patient at risk to self? Yes.    Has the patient been a risk to self in the past 6 months? Yes.    Has the patient been a risk to self within the distant past? Yes.    Is the patient a risk to others? Yes.    Has the patient been a risk to others in the past 6 months? Yes.    Has the patient been a risk to others within the distant past? Yes.     Prior Inpatient Therapy:   Prior Outpatient Therapy:    Alcohol Screening: 1. How often do you have a drink containing alcohol?: Monthly or less 2. How many drinks containing alcohol do you have on a typical day when you are drinking?: 5 or 6 3. How often do you have six or more drinks on one occasion?: Less than monthly Preliminary Score: 3 4. How often during the last year have you found that you were not able to stop drinking once you had started?: Never 5. How often during the last year have you failed to do what was normally expected from you becasue of drinking?: Never 6. How often during the last year have you needed a first drink in the morning to get yourself going after a heavy drinking session?: Never 7. How often during the last year have you had a feeling of guilt of remorse after drinking?: Never 8.  How often during the last year have you been unable to remember what happened the night before because you had been drinking?: Never 9. Have you or someone else been injured as a result of your drinking?: No 10. Has a relative or friend or a doctor or another health worker been concerned about your drinking or suggested you cut down?: No Alcohol Use Disorder Identification Test Final Score (AUDIT): 4 Brief Intervention: AUDIT score less than 7 or less-screening does not suggest unhealthy drinking-brief intervention not indicated Substance Abuse History in the last 12 months:  Yes.   alcohol - mild - BAL - 145 Consequences  of Substance Abuse: Negative Previous Psychotropic Medications: Yes - invega, invega sustenna  Psychological Evaluations: No  Past Medical History:  Past Medical History:  Diagnosis Date  . Medical history non-contributory   . Migraine   . No pertinent past medical history   . Paranoid schizophrenia [facility])     Past Surgical History:  Procedure Laterality Date  . CYST REMOVAL NECK    . CYST REMOVAL NECK  2005  . NO PAST SURGERIES     Family History: pt denies hx of htn, dm, thyroid disease Family History  Problem Relation Age of Onset  . Mental illness Neg Hx    Family Psychiatric  History: Pt denies Tobacco Screening: Have you used any form of tobacco in the last 30 days? (Cigarettes, Smokeless Tobacco, Cigars, and/or Pipes): Yes Tobacco use, Select all that apply: 5 or more cigarettes per day Are you interested in Tobacco Cessation Medications?: Yes, will notify MD for an order Counseled patient on smoking cessation including recognizing danger situations, developing coping skills and basic information about quitting provided: Yes (offered nicotine patch) Social History: Pt is single , lives with mother in [place], has two children aged 12, 6 whom her mother takes care of.Is on SSI. History  Alcohol Use No     History  Drug Use No    Additional Social History:      Pain Medications: see PTAmeds list Prescriptions: See MARs Over the Counter: see PTA meds list History of alcohol / drug use?: Yes Negative Consequences of Use: Personal relationships Withdrawal Symptoms: Other (Comment) (none)                    Allergies:   Allergies  Allergen Reactions  . Invega [Paliperidone Er] Anaphylaxis    AKATHISIA   Lab Results:  Results for orders placed or performed during the hospital encounter of 02/17/16 (from the past 48 hour(s))  Hepatic function panel     Status: Abnormal   Collection Time: 02/18/16  6:27 AM  Result Value Ref Range   Total Protein 6.0 (L) 6.5 -  8.1 g/dL   Albumin 3.3 (L) 3.5 - 5.0 g/dL   AST 15 15 - 41 U/L   ALT 21 14 - 54 U/L   Alkaline Phosphatase 46 38 - 126 U/L   Total Bilirubin 0.3 0.3 - 1.2 mg/dL   Bilirubin, Direct <1.6 (L) 0.1 - 0.5 mg/dL   Indirect Bilirubin NOT CALCULATED 0.3 - 0.9 mg/dL    Comment: Performed at [facility]  Lipid panel     Status: Abnormal   Collection Time: 02/18/16  6:27 AM  Result Value Ref Range   Cholesterol 159 0 - 200 mg/dL   Triglycerides 109 <604 mg/dL   HDL 33 (L) >54 mg/dL   Total CHOL/HDL Ratio 4.8 RATIO   VLDL 27 0 - 40  mg/dL   LDL Cholesterol 99 0 - 99 mg/dL    Comment:        Total Cholesterol/HDL:CHD Risk Coronary Heart Disease Risk Table                     Men   Women  1/2 Average Risk   3.4   3.3  Average Risk       5.0   4.4  2 X Average Risk   9.6   7.1  3 X Average Risk  23.4   11.0        Use the calculated Patient Ratio above and the CHD Risk Table to determine the patient's CHD Risk.        ATP III CLASSIFICATION (LDL):  <100     mg/dL   Optimal  960-454  mg/dL   Near or Above                    Optimal  130-159  mg/dL   Borderline  098-119  mg/dL   High  >147     mg/dL   Very High Performed at [facility]   TSH     Status: None   Collection Time: 02/18/16  6:27 AM  Result Value Ref Range   TSH 1.475 0.350 - 4.500 uIU/mL    Comment: Performed by a 3rd Generation assay with a functional sensitivity of <=0.01 uIU/mL. Performed at [facility]     Blood Alcohol level:  Lab Results  Component Value Date   ETH 145 (H) 02/16/2016   ETH <5 08/25/2014    Metabolic Disorder Labs:  Lab Results  Component Value Date   HGBA1C 5.5 08/03/2013   MPG 111 08/03/2013   Lab Results  Component Value Date   PROLACTIN 112.4 08/03/2013   Lab Results  Component Value Date   CHOL 159 02/18/2016   TRIG 133 02/18/2016   HDL 33 (L) 02/18/2016   CHOLHDL 4.8 02/18/2016   VLDL 27 02/18/2016   LDLCALC 99 02/18/2016    LDLCALC 126 (H) 08/03/2013    Current Medications: Current Facility-Administered Medications  Medication Dose Route Frequency Provider Last Rate Last Dose  . acetaminophen (TYLENOL) tablet 650 mg  650 mg Oral Q6H PRN Laveda Abbe, NP   650 mg at 02/17/16 1855  . alum & mag hydroxide-simeth (MAALOX/MYLANTA) 200-200-20 MG/5ML suspension 30 mL  30 mL Oral Q4H PRN Laveda Abbe, NP      . hydrOXYzine (ATARAX/VISTARIL) tablet 25 mg  25 mg Oral Q6H PRN Laveda Abbe, NP   25 mg at 02/17/16 2301  . LORazepam (ATIVAN) tablet 1 mg  1 mg Oral Q4H PRN Jomarie Longs, MD      . magnesium hydroxide (MILK OF MAGNESIA) suspension 30 mL  30 mL Oral Daily PRN Laveda Abbe, NP      . OLANZapine zydis (ZYPREXA) disintegrating tablet 10 mg  10 mg Oral TID PRN Jomarie Longs, MD       Or  . OLANZapine (ZYPREXA) injection 10 mg  10 mg Intramuscular TID PRN Jomarie Longs, MD      . traZODone (DESYREL) tablet 50 mg  50 mg Oral QHS PRN Laveda Abbe, NP   50 mg at 02/17/16 2301  . ziprasidone (GEODON) capsule 60 mg  60 mg Oral BID WC Laveda Abbe, NP   60 mg at 02/18/16 8295   PTA Medications: Prescriptions Prior to Admission  Medication  Sig Dispense Refill Last Dose  . Paliperidone Palmitate 156 MG/ML SUSP Inject 1 mL (156 mg total) into the muscle every 30 (thirty) days. First dose of 234 mg received on 08/05/13 with next dose of 156 mg due in thirty days. (Patient not taking: Reported on 02/16/2016) 0.9 mL 3 Not Taking at Unknown time  . ziprasidone (GEODON) 60 MG capsule Take 60 mg by mouth 2 (two) times daily with a meal.       Musculoskeletal: Strength & Muscle Tone: within normal limits Gait & Station: normal Patient leans: N/A  Psychiatric Specialty Exam: Physical Exam  Review of Systems  Psychiatric/Behavioral: The patient is nervous/anxious.   All other systems reviewed and are negative.   Blood pressure 124/66, pulse 79, temperature 98.6 F (37 C),  temperature source Oral, resp. rate 20, height 5' 3.75" (1.619 m), weight 96.2 kg (212 lb), last menstrual period 02/15/2016.Body mass index is 36.68 kg/m.  General Appearance: Guarded  Eye Contact:  Fair  Speech:  Normal Rate  Volume:  Normal  Mood:  Irritable  Affect:  Congruent  Thought Process:  Linear and Descriptions of Associations: Circumstantial  Orientation:  Full (Time, Place, and Person)  Thought Content:  Delusions, Paranoid Ideation and Rumination  Suicidal Thoughts:  No is paranoid and was aggressive at home and hence a potential danger to self or others  Homicidal Thoughts:  No  Memory:  Immediate;   Fair Recent;   Fair Remote;   Fair  Judgement:  Impaired  Insight:  Shallow  Psychomotor Activity:  Normal  Concentration:  Concentration: Fair and Attention Span: Fair  Recall:  FiservFair  Fund of Knowledge:  Fair  Language:  Fair  Akathisia:  No  Handed:  Right  AIMS (if indicated):     Assets:  Desire for Improvement  ADL's:  Intact  Cognition:  WNL  Sleep:  Number of Hours: 5.25    Treatment Plan Summary:Patient appears guarded, paranoid, irritable - will restart medications and observe on the unit.   Daily contact with patient to assess and evaluate symptoms and progress in treatment and Medication management  Patient will benefit from inpatient treatment and stabilization.   Estimated length of stay is 5-7 days.   Reviewed past medical records,treatment plan.   For Schizophrenia: Geodon 60 mg po bid.  For anxiety/agitation: PRN medications as per Unit protocol.  Alcohol use disorder; Provided counseling. CIWA/Ativan prn.   Will continue to monitor vitals ,medication compliance and treatment side effects while patient is here.   Will monitor for medical issues as well as call consult as needed.   Reviewed labs cbc - wbc - 17.2 - will repeat , K+ - 3.3 - will repeat CMP , uds - POS FOR bzd - likely from ED, BAL - 145- ,lipid panel- wnl, tsh -  wnl.  EKG for qtc - reviewed- wnl .  CSW will start working on disposition. Obtain collateral information from family.  Patient to participate in therapeutic milieu .      Observation Level/Precautions:  15 minute checks    Psychotherapy:  Individual and group therapy     Consultations:  csw  Discharge Concerns:  Stability and safety       Physician Treatment Plan for Primary Diagnosis: Schizophrenia, paranoid, chronic with acute exacerbation (HCC) Long Term Goal(s): Improvement in symptoms so as ready for discharge  Short Term Goals: Ability to verbalize feelings will improve and Compliance with prescribed medications will improve  Physician Treatment Plan for Secondary  Diagnosis: Principal Problem:   Schizophrenia, paranoid, chronic with acute exacerbation (HCC)  Long Term Goal(s): Improvement in symptoms so as ready for discharge  Short Term Goals: Ability to verbalize feelings will improve and Compliance with prescribed medications will improve  I certify that inpatient services furnished can reasonably be expected to improve the patient's condition.    Jomarie Longs, MD 1/1/201812:48 PM

## 2016-02-18 NOTE — Progress Notes (Signed)
D:Pt is mildly guaraded and responding to internal stimuli. When asked about voices, pt verbally denies. Pt has been out in the dayroom some today and is currently in her bed resting. Respirations are even and unlabored.  A:Offered support, encouragement and 15 minute checks. R:Pt denies si and hi. Safety maintained on the unit.

## 2016-02-19 LAB — CBC WITH DIFFERENTIAL/PLATELET
BASOS ABS: 0 10*3/uL (ref 0.0–0.1)
Basophils Relative: 0 %
EOS PCT: 4 %
Eosinophils Absolute: 0.4 10*3/uL (ref 0.0–0.7)
HEMATOCRIT: 38.6 % (ref 36.0–46.0)
Hemoglobin: 13 g/dL (ref 12.0–15.0)
LYMPHS ABS: 3.5 10*3/uL (ref 0.7–4.0)
Lymphocytes Relative: 30 %
MCH: 28.5 pg (ref 26.0–34.0)
MCHC: 33.7 g/dL (ref 30.0–36.0)
MCV: 84.6 fL (ref 78.0–100.0)
MONO ABS: 0.6 10*3/uL (ref 0.1–1.0)
MONOS PCT: 5 %
NEUTROS ABS: 7.1 10*3/uL (ref 1.7–7.7)
Neutrophils Relative %: 61 %
Platelets: 260 10*3/uL (ref 150–400)
RBC: 4.56 MIL/uL (ref 3.87–5.11)
RDW: 13.6 % (ref 11.5–15.5)
WBC: 11.6 10*3/uL — ABNORMAL HIGH (ref 4.0–10.5)

## 2016-02-19 LAB — COMPREHENSIVE METABOLIC PANEL
ALT: 32 U/L (ref 14–54)
AST: 20 U/L (ref 15–41)
Albumin: 3.5 g/dL (ref 3.5–5.0)
Alkaline Phosphatase: 49 U/L (ref 38–126)
Anion gap: 7 (ref 5–15)
BILIRUBIN TOTAL: 0.8 mg/dL (ref 0.3–1.2)
BUN: 14 mg/dL (ref 6–20)
CO2: 27 mmol/L (ref 22–32)
CREATININE: 0.75 mg/dL (ref 0.44–1.00)
Calcium: 9 mg/dL (ref 8.9–10.3)
Chloride: 107 mmol/L (ref 101–111)
Glucose, Bld: 96 mg/dL (ref 65–99)
POTASSIUM: 4.1 mmol/L (ref 3.5–5.1)
Sodium: 141 mmol/L (ref 135–145)
TOTAL PROTEIN: 6.7 g/dL (ref 6.5–8.1)

## 2016-02-19 MED ORDER — ZIPRASIDONE HCL 80 MG PO CAPS
80.0000 mg | ORAL_CAPSULE | Freq: Two times a day (BID) | ORAL | Status: DC
  Administered 2016-02-19 – 2016-02-21 (×4): 80 mg via ORAL
  Filled 2016-02-19 (×6): qty 1

## 2016-02-19 NOTE — Progress Notes (Signed)
Recreation Therapy Notes  INPATIENT RECREATION THERAPY ASSESSMENT  Patient Details Name: Angelica RosebushMaria Antoinette [name] MRN: [id number] DOB: [birth year] Today's Date: 02/19/2016  Patient Stressors: Family, Other (Comment) (Finances)  Pt stated she did not know why she was here.  Coping Skills:   Avoidance, Exercise, Talking, Music  Personal Challenges:  (Pt did not name any personal challenges.)  Leisure Interests (2+):  Music - Listen, Social - Family  Awareness of Community Resources:  Yes  Community Resources:  Tree surgeonMall, Public affairs consultantestaurants, Research scientist (physical sciences)Movie Theaters  Current Use: Yes  Patient Strengths:  Easy to communicate with; always open and honest  Patient Identified Areas of Improvement:  Attitude  Current Recreation Participation:  Everyday  Patient Goal for Hospitalization:  "To try and maintain, take medications"  [place]  [place]idence[place]  Current [place] (including self-harm):  No  Current HI:  No  Consent to Intern Participation: N/A   Caroll RancherMarjette Jalisa Sacco, LRT/CTRS  Caroll RancherLindsay, Caprina Wussow A 02/19/2016, 12:09 PM

## 2016-02-19 NOTE — Progress Notes (Signed)
D: Pt denies SI/HI/AVH. Pt is pleasant and cooperative. Pt denies AVH, but is seen talking to someone not there. Pt stated she plans to stay on her medication when she D/C.   A: Pt was offered support and encouragement. Pt was given scheduled medications. Pt was encourage to attend groups. Q 15 minute checks were done for safety.   R:Pt attends groups and interacts well with peers and staff. Pt is taking medication. Pt has no complaints at this time.Pt receptive to treatment and safety maintained on unit.

## 2016-02-19 NOTE — Progress Notes (Signed)
DAR NOTE:  Pt forwards minimal with this nurse. Per pt self inventory form pt reports she slept good last night. Pt reports a good appetite, normal energy level, good concentration. Pt rates depression 0/10, hopelessness 0/10, anxiety 2/10- all on 0-10 scale, 10 being the worse. Pt denies SI/HI/AVH. Pt denies physical problems. Pt denies withdrawing from drugs/alcohol. Pt reports her goal is "working on being better so that I can be released." pt reports she will "attend meeting, activities."  Special checks q 15 mins in place for safety. Will continue to monitor.

## 2016-02-19 NOTE — Plan of Care (Signed)
Problem: Health Behavior/Discharge Planning: Goal: Compliance with treatment plan for underlying cause of condition will improve Outcome: Progressing Pt stated she plans to continue her medication as scheduled when she  D/C

## 2016-02-19 NOTE — Progress Notes (Signed)
Recreation Therapy Notes  Date: 02/19/16 Time: 1000 Location: 500 Hall Dayroom   Group Topic: Communication, Team Building, Problem Solving  Goal Area(s) Addresses:  Patient will effectively work with peer towards shared goal.  Patient will identify skill used to make activity successful.  Patient will identify how skills used during activity can be used to reach post d/c goals.   Behavioral Response: Observed   Intervention: STEM Activity   Activity: [place] [employer]. Patients were provided the following materials: 5 drinking straws, 5 rubber bands, 5 paper clips, 2 index cards, 2 drinking cups, and 2 toilet paper rolls. Using the provided materials patients were asked to build a launching mechanisms to launch a ping pong ball approximately 12 feet. Patients were divided into teams of 3-5.   Education: Pharmacist, communityocial Skills, Building control surveyorDischarge Planning.   Education Outcome: Acknowledges education/In group clarification offered/Needs additional education.   Clinical Observations/Feedback: Pt mainly observed but was seen talking to peers during group.  Pt then began mumbling to herself.  Pt expressed the group had to work together to complete the activity.  Pt also stated that using the skills from this activity will help others know her "needs and wants".   Caroll RancherMarjette Sephiroth Mcl[place], LRT/CTRS      Caroll RancherLindsay, Peirce Deveney A 02/19/2016 11:29 AM

## 2016-02-19 NOTE — Progress Notes (Signed)
[facility] MD Progress Note  02/19/2016 12:52 PM Angelica [name]  MRN:  [id number] Subjective: Pt states " I am fine.'  Objective:Patient seen and chart reviewed.Discussed patient with treatment team.  Pt today continues to be vaguely irritable, superficially cooperative . Pt answers questions mostly in monosyllables. Pt refuses to change her Geodon to another medications which comes in an LAI form. Discussed her mother's concern with her about her noncompliance with medications as well as her inability to cooperate with ACTT. Pt continues to need support and encouragement.      Principal Problem: Schizophrenia, paranoid, chronic with acute exacerbation ([place]) Diagnosis:   Patient Active Problem List   Diagnosis Date Noted  . Schizophrenia, paranoid, chronic with acute exacerbation ([place]) [F20.0] 02/17/2016   Total Time spent with patient: 20 minutes  Past Psychiatric History: Please see H&P.   Past Medical History:  Past Medical History:  Diagnosis Date  . Medical history non-contributory   . Migraine   . No pertinent past medical history   . Paranoid schizophrenia [facility])     Past Surgical History:  Procedure Laterality Date  . CYST REMOVAL NECK    . CYST REMOVAL NECK  2005  . NO PAST SURGERIES     Family History:  Family History  Problem Relation Age of Onset  . Mental illness Neg Hx    Family Psychiatric  History: Please see H&P.  Social History:  History  Alcohol Use No     History  Drug Use No    Social History   Social History  . Marital status: Single    Spouse name: N/A  . Number of children: N/A  . Years of education: N/A   Social History Main Topics  . Smoking status: Current Every Day Smoker    Packs/day: 0.25    Years: 5.00    Types: Cigarettes  . Smokeless tobacco: Never Used  . Alcohol use No  . Drug use: No  . Sexual activity: Yes    Birth control/ protection: Condom   Other Topics Concern  . None   Social History Narrative  .  None   Additional Social History:    Pain Medications: see PTAmeds list Prescriptions: See MARs Over the Counter: see PTA meds list History of alcohol / drug use?: Yes Negative Consequences of Use: Personal relationships Withdrawal Symptoms: Other (Comment) (none)                    Sleep: Fair  Appetite:  Fair  Current Medications: Current Facility-Administered Medications  Medication Dose Route Frequency Provider Last Rate Last Dose  . acetaminophen (TYLENOL) tablet 650 mg  650 mg Oral Q6H PRN Ethelene Hal, NP   650 mg at 02/19/16 0904  . alum & mag hydroxide-simeth (MAALOX/MYLANTA) 200-200-20 MG/5ML suspension 30 mL  30 mL Oral Q4H PRN Ethelene Hal, NP   30 mL at 02/18/16 1849  . hydrOXYzine (ATARAX/VISTARIL) tablet 25 mg  25 mg Oral Q6H PRN Ethelene Hal, NP   25 mg at 02/19/16 0904  . ibuprofen (ADVIL,MOTRIN) tablet 600 mg  600 mg Oral Q6H PRN Laverle Hobby, PA-C   600 mg at 02/18/16 2135  . LORazepam (ATIVAN) tablet 1 mg  1 mg Oral Q4H PRN Ursula Alert, MD   1 mg at 02/18/16 2135  . magnesium hydroxide (MILK OF MAGNESIA) suspension 30 mL  30 mL Oral Daily PRN Ethelene Hal, NP      . OLANZapine zydis [facility]) disintegrating  tablet 10 mg  10 mg Oral TID PRN Ursula Alert, MD       Or  . OLANZapine (ZYPREXA) injection 10 mg  10 mg Intramuscular TID PRN Ursula Alert, MD      . traZODone (DESYREL) tablet 50 mg  50 mg Oral QHS PRN Ethelene Hal, NP   50 mg at 02/18/16 2135  . ziprasidone (GEODON) capsule 60 mg  60 mg Oral BID WC Ethelene Hal, NP   60 mg at 02/19/16 4008    Lab Results:  Results for orders placed or performed during the hospital encounter of 02/17/16 (from the past 48 hour(s))  Hepatic function panel     Status: Abnormal   Collection Time: 02/18/16  6:27 AM  Result Value Ref Range   Total Protein 6.0 (L) 6.5 - 8.1 g/dL   Albumin 3.3 (L) 3.5 - 5.0 g/dL   AST 15 15 - 41 U/L   ALT 21 14 - 54 U/L    Alkaline Phosphatase 46 38 - 126 U/L   Total Bilirubin 0.3 0.3 - 1.2 mg/dL   Bilirubin, Direct <0.1 (L) 0.1 - 0.5 mg/dL   Indirect Bilirubin NOT CALCULATED 0.3 - 0.9 mg/dL    Comment: Performed at [facility]  Lipid panel     Status: Abnormal   Collection Time: 02/18/16  6:27 AM  Result Value Ref Range   Cholesterol 159 0 - 200 mg/dL   Triglycerides 133 <150 mg/dL   HDL 33 (L) >40 mg/dL   Total CHOL/HDL Ratio 4.8 RATIO   VLDL 27 0 - 40 mg/dL   LDL Cholesterol 99 0 - 99 mg/dL    Comment:        Total Cholesterol/HDL:CHD Risk Coronary Heart Disease Risk Table                     Men   Women  1/2 Average Risk   3.4   3.3  Average Risk       5.0   4.4  2 X Average Risk   9.6   7.1  3 X Average Risk  23.4   11.0        Use the calculated Patient Ratio above and the CHD Risk Table to determine the patient's CHD Risk.        ATP III CLASSIFICATION (LDL):  <100     mg/dL   Optimal  100-129  mg/dL   Near or Above                    Optimal  130-159  mg/dL   Borderline  160-189  mg/dL   High  >190     mg/dL   Very High Performed at [facility]   TSH     Status: None   Collection Time: 02/18/16  6:27 AM  Result Value Ref Range   TSH 1.475 0.350 - 4.500 uIU/mL    Comment: Performed by a 3rd Generation assay with a functional sensitivity of <=0.01 uIU/mL. Performed at [facility]   CBC with Differential/Platelet     Status: Abnormal   Collection Time: 02/19/16  6:25 AM  Result Value Ref Range   WBC 11.6 (H) 4.0 - 10.5 K/uL   RBC 4.56 3.87 - 5.11 MIL/uL   Hemoglobin 13.0 12.0 - 15.0 g/dL   HCT 38.6 36.0 - 46.0 %   MCV 84.6 78.0 - 100.0 fL   MCH 28.5 26.0 - 34.0  pg   MCHC 33.7 30.0 - 36.0 g/dL   RDW 13.6 11.5 - 15.5 %   Platelets 260 150 - 400 K/uL   Neutrophils Relative % 61 %   Neutro Abs 7.1 1.7 - 7.7 K/uL   Lymphocytes Relative 30 %   Lymphs Abs 3.5 0.7 - 4.0 K/uL   Monocytes Relative 5 %   Monocytes Absolute 0.6 0.1 - 1.0  K/uL   Eosinophils Relative 4 %   Eosinophils Absolute 0.4 0.0 - 0.7 K/uL   Basophils Relative 0 %   Basophils Absolute 0.0 0.0 - 0.1 K/uL    Comment: Performed at [facility]  Comprehensive metabolic panel     Status: None   Collection Time: 02/19/16  6:25 AM  Result Value Ref Range   Sodium 141 135 - 145 mmol/L   Potassium 4.1 3.5 - 5.1 mmol/L   Chloride 107 101 - 111 mmol/L   CO2 27 22 - 32 mmol/L   Glucose, Bld 96 65 - 99 mg/dL   BUN 14 6 - 20 mg/dL   Creatinine, Ser 0.75 0.44 - 1.00 mg/dL   Calcium 9.0 8.9 - 10.3 mg/dL   Total Protein 6.7 6.5 - 8.1 g/dL   Albumin 3.5 3.5 - 5.0 g/dL   AST 20 15 - 41 U/L   ALT 32 14 - 54 U/L   Alkaline Phosphatase 49 38 - 126 U/L   Total Bilirubin 0.8 0.3 - 1.2 mg/dL   GFR calc non Af Amer >60 >60 mL/min   GFR calc Af Amer >60 >60 mL/min    Comment: (NOTE) The eGFR has been calculated using the CKD EPI equation. This calculation has not been validated in all clinical situations. eGFR's persistently <60 mL/min signify possible Chronic Kidney Disease.    Anion gap 7 5 - 15    Comment: Performed at [facility]    Blood Alcohol level:  Lab Results  Component Value Date   ETH 145 (H) 02/16/2016   ETH <5 53/79/4327    Metabolic Disorder Labs: Lab Results  Component Value Date   HGBA1C 5.5 08/03/2013   MPG 111 08/03/2013   Lab Results  Component Value Date   PROLACTIN 112.4 08/03/2013   Lab Results  Component Value Date   CHOL 159 02/18/2016   TRIG 133 02/18/2016   HDL 33 (L) 02/18/2016   CHOLHDL 4.8 02/18/2016   VLDL 27 02/18/2016   LDLCALC 99 02/18/2016   LDLCALC 126 (H) 08/03/2013    Physical Findings: AIMS: Facial and Oral Movements Muscles of Facial Expression: None, normal Lips and Perioral Area: None, normal Jaw: None, normal Tongue: None, normal,Extremity Movements Upper (arms, wrists, hands, fingers): None, normal Lower (legs, knees, ankles, toes): None, normal, Trunk  Movements Neck, shoulders, hips: None, normal, Overall Severity Severity of abnormal movements (highest score from questions above): None, normal Incapacitation due to abnormal movements: None, normal Patient's awareness of abnormal movements (rate only patient's report): No Awareness, Dental Status Current problems with teeth and/or dentures?: No Does patient usually wear dentures?: No  CIWA:  CIWA-Ar Total: 3 COWS:  COWS Total Score: 1  Musculoskeletal: Strength & Muscle Tone: within normal limits Gait & Station: normal Patient leans: N/A  Psychiatric Specialty Exam: Physical Exam  Nursing note and vitals reviewed.   Review of Systems  Psychiatric/Behavioral: The patient is nervous/anxious.   All other systems reviewed and are negative.   Blood pressure 132/72, pulse (!) 102, temperature 98.7 F (37.1 C), temperature source  Oral, resp. rate 18, height 5' 3.75" (1.619 m), weight 96.2 kg (212 lb), last menstrual period 02/15/2016.Body mass index is 36.68 kg/m.  General Appearance: Fairly Groomed  Eye Contact:  Minimal  Speech:  Normal Rate  Volume:  Increased  Mood:  Angry, Anxious and Irritable  Affect:  Congruent  Thought Process:  Goal Directed and Descriptions of Associations: Circumstantial  Orientation:  Full (Time, Place, and Person)  Thought Content:  Paranoid Ideation and Rumination  Suicidal Thoughts:  No  Homicidal Thoughts:  No  Memory:  Immediate;   Fair Recent;   Fair Remote;   Fair  Judgement:  Impaired  Insight:  Shallow  Psychomotor Activity:  Normal  Concentration:  Concentration: Fair and Attention Span: Fair  Recall:  [employer] of Knowledge:  Fair  Language:  Fair  Akathisia:  No  Handed:  Right  AIMS (if indicated):     Assets:  Physical Health Social Support  ADL's:  Intact  Cognition:  WNL  Sleep:  Number of Hours: 6.5     Treatment Plan Summary: Patient today seen as irritable , limited participation in milieu, continue  treatment.  Schizophrenia, paranoid, chronic with acute exacerbation ([place]) unstable  Will continue today 02/19/16 plan as below except where it is noted.   Daily contact with patient to assess and evaluate symptoms and progress in treatment and Medication management For Schizophrenia: Increase Geodon to 80 mg po bid.  For anxiety/agitation: PRN medications as per Unit protocol.  Alcohol use disorder; Provided counseling. CIWA/Ativan prn.   Will continue to monitor vitals ,medication compliance and treatment side effects while patient is here.   Will monitor for medical issues as well as call consult as needed.   Reviewed labs cbc - wbc - 17.2 - will repeat , K+ - 3.3 -  repeat CMP- wnl  , uds - POS FOR bzd - likely from ED, BAL - 145- ,lipid panel- wnl, tsh - wnl.  EKG for qtc - reviewed- wnl .  CSW will continue working on disposition.   Patient to participate in therapeutic milieu .  Rogelio Waynick, MD 02/19/2016, 12:52 PM

## 2016-02-19 NOTE — [facility] Group Notes (Signed)
[facility] LCSW Group Therapy  02/19/2016 , 1:22 PM   Type of Therapy:  Group Therapy  Participation Level:  Active  Participation Quality:  Attentive  Affect:  Appropriate  Cognitive:  Alert  Insight:  Improving  Engagement in Therapy:  Engaged  Modes of Intervention:  Discussion, Exploration and Socialization  Summary of Progress/Problems: Today's group focused on the term Diagnosis.  Participants were asked to define the term, and then pronounce whether it is a negative, positive or neutral term.  Stayed the entire time, engaged throughout.  Minimal interaction.  Initially stated she needs to learn to take more responsibility, but then recanted and said she just needs to stay out of here.  No insight as to what she needs to do differently for that to happen.  Asked several times when she will be able to d/c.  [name], Latanya Hemmer B 02/19/2016 , 1:22 PM

## 2016-02-19 NOTE — Progress Notes (Signed)
Recreation Therapy Notes  Animal-Assisted Activity (AAA) Program Checklist/Progress Notes Patient Eligibility Criteria Checklist & Daily Group note for Rec Tx Intervention  Date: 01.02.2018 Time: 2:45pm [address]    AAA/T Program Assumption of Risk Form signed by Patient/ or Parent Legal Guardian No  Clinical Observations/Feedback: Patient discussed with MD for appropriateness in pet therapy session. Both LRT and MD agree patient is appropriate for participation. Patient offered participation in session, but politely declined. No consent form signed by patient.   Angelica [name] L Andric Kerce, LRT/CTRS       [name] L 02/19/2016 3:12 PM

## 2016-02-20 LAB — HEMOGLOBIN A1C
HEMOGLOBIN A1C: 5.3 % (ref 4.8–5.6)
MEAN PLASMA GLUCOSE: 105 mg/dL

## 2016-02-20 LAB — PROLACTIN: PROLACTIN: 161.9 ng/mL — AB (ref 4.8–23.3)

## 2016-02-20 NOTE — [facility] Group Notes (Signed)
[facility]  02/20/2016 , 1:48 PM    Type of Therapy:  [facility] Presentation  Participation Level:  Active  Participation Quality:  Attentive  Affect:  Blunted  Cognitive:  Oriented  Insight:  Limited  Engagement in Therapy:  Engaged  Modes of Intervention:  Discussion, Education and Socialization  Summary of Progress/Problems:  Onalee HuaDavid from [employer] came to present his recovery story and play the guitar.  Stayed the entire time, slept the whole time.  [name], Ashari Llewellyn B 02/20/2016 , 1:48 PM

## 2016-02-20 NOTE — Progress Notes (Signed)
Recreation Therapy Notes  Date: 02/20/16 Time: 1000 Location: [address] Day[address]  Group Topic: Coping Skills  Goal Area(s) Addresses:  Patients will be able to identify positive coping skills. Patients will be able to identify the benefits of positive coping skills. Patients will be able to identity how using positive coping skills will help them post d/c.  Behavioral Response: Engaged  Intervention: Production designer, theatre/television/filmWeb design worksheet, pencils  Activity: OrthoptistWeb Design.  Patients were to write in the center of web what brought them to the hospital.  Patients were to write coping skills along the lines of the web that can help them deal with the issues that brought them here.  Education: PharmacologistCoping Skills, Building control surveyorDischarge Planning.   Education Outcome: Acknowledges understanding/In group clarification offered/Needs additional education.   Clinical Observations/Feedback: Pt stated that coping skills "help you understand and cope".  Pt stated she was here for fighting.  Pt stated she should have stopped and thought before reacting. Pt also stated she should have thought about her children and there was no need for her to fight.  Pt expressed that if she would have thought before reacting, "it would help me not get IVC'd".  Pt then expressed when asked that if stopping and thinking doesn't work, she would call someone from her ACT team.   Caroll RancherMarjette Saket Hellstrom, LRT/CTRS         Caroll RancherLindsay, Beaux Wedemeyer A 02/20/2016 11:55 AM

## 2016-02-20 NOTE — Progress Notes (Signed)
DAR NOTE: Pt present with flat affect and depressed mood in the unit. Pt has been observed talking to herself most of the time. Pt isolates in her room, but came out for meds and meals. Pt complained of headache, took all his meds as scheduled. As per self inventory, pt had a good night sleep, good appetite, normal energy, and good concentration. Pt rate depression at 0, hopeless ness at 0, and anxiety at 0. Pt's safety ensured with 15 minute and environmental checks. Pt currently denies SI/HI and A/V hallucinations. Pt verbally agrees to seek staff if SI/HI or A/VH occurs and to consult with staff before acting on these thoughts. Will continue POC.

## 2016-02-20 NOTE — Plan of Care (Signed)
Problem: Medication: Goal: Compliance with prescribed medication regimen will improve Pt is med compliant  Problem: Safety: Goal: Ability to disclose and discuss suicidal ideas will improve Pt denies suicidal ideations

## 2016-02-20 NOTE — Progress Notes (Signed)
[facility] MD Progress Note  02/20/2016 2:50 PM Myalee Stengel  MRN:  [id number]  Subjective: Pt states " I'm feeling a little tired today.'  Objective: Patient seen and chart reviewed.Discussed patient with treatment team.  Pt today seento be calm, cooperative . Pt answers questions mostly in monosyllables. Pt refuses to change her Geodon to another medications which comes in an LAI form. Discussed her mother's concern with her about her noncompliance with medications as well as her inability to cooperate with ACTT. Pt continues to need support and encouragement. She currently denies any adverse effects from medications.  Principal Problem: Schizophrenia, paranoid, chronic with acute exacerbation ([place])  Diagnosis:   Patient Active Problem List   Diagnosis Date Noted  . Schizophrenia, paranoid, chronic with acute exacerbation ([place]) [F20.0] 02/17/2016   Total Time spent with patient: 15 minutes  Past Psychiatric History: Please see H&P.  Past Medical History:  Past Medical History:  Diagnosis Date  . Medical history non-contributory   . Migraine   . No pertinent past medical history   . Paranoid schizophrenia [facility])     Past Surgical History:  Procedure Laterality Date  . CYST REMOVAL NECK    . CYST REMOVAL NECK  2005  . NO PAST SURGERIES     Family History:  Family History  Problem Relation Age of Onset  . Mental illness Neg Hx    Family Psychiatric  History: Please see H&P.  Social History:  History  Alcohol Use No     History  Drug Use No    Social History   Social History  . Marital status: Single    Spouse name: N/A  . Number of children: N/A  . Years of education: N/A   Social History Main Topics  . Smoking status: Current Every Day Smoker    Packs/day: 0.25    Years: 5.00    Types: Cigarettes  . Smokeless tobacco: Never Used  . Alcohol use No  . Drug use: No  . Sexual activity: Yes    Birth control/ protection: Condom   Other Topics Concern   . None   Social History Narrative  . None   Additional Social History:    Pain Medications: see PTAmeds list Prescriptions: See MARs Over the Counter: see PTA meds list History of alcohol / drug use?: Yes Negative Consequences of Use: Personal relationships Withdrawal Symptoms: Other (Comment) (none)  Sleep: Fair  Appetite:  Fair  Current Medications: Current Facility-Administered Medications  Medication Dose Route Frequency Provider Last Rate Last Dose  . acetaminophen (TYLENOL) tablet 650 mg  650 mg Oral Q6H PRN Ethelene Hal, NP   650 mg at 02/20/16 0943  . alum & mag hydroxide-simeth (MAALOX/MYLANTA) 200-200-20 MG/5ML suspension 30 mL  30 mL Oral Q4H PRN Ethelene Hal, NP   30 mL at 02/18/16 1849  . hydrOXYzine (ATARAX/VISTARIL) tablet 25 mg  25 mg Oral Q6H PRN Ethelene Hal, NP   25 mg at 02/19/16 0904  . ibuprofen (ADVIL,MOTRIN) tablet 600 mg  600 mg Oral Q6H PRN Laverle Hobby, PA-C   600 mg at 02/18/16 2135  . LORazepam (ATIVAN) tablet 1 mg  1 mg Oral Q4H PRN Ursula Alert, MD   1 mg at 02/19/16 2220  . magnesium hydroxide (MILK OF MAGNESIA) suspension 30 mL  30 mL Oral Daily PRN Ethelene Hal, NP      . OLANZapine zydis (ZYPREXA) disintegrating tablet 10 mg  10 mg Oral TID PRN Ursula Alert, MD  Or  . OLANZapine (ZYPREXA) injection 10 mg  10 mg Intramuscular TID PRN Ursula Alert, MD      . traZODone (DESYREL) tablet 50 mg  50 mg Oral QHS PRN Ethelene Hal, NP   50 mg at 02/19/16 2220  . ziprasidone (GEODON) capsule 80 mg  80 mg Oral BID WC Ursula Alert, MD   80 mg at 02/20/16 9728    Lab Results:  Results for orders placed or performed during the hospital encounter of 02/17/16 (from the past 48 hour(s))  Hemoglobin A1c     Status: None   Collection Time: 02/19/16  6:25 AM  Result Value Ref Range   Hgb A1c MFr Bld 5.3 4.8 - 5.6 %    Comment: (NOTE)         Pre-diabetes: 5.7 - 6.4         Diabetes: >6.4          Glycemic control for adults with diabetes: <7.0    Mean Plasma Glucose 105 mg/dL    Comment: (NOTE) Performed At: [facility] [place], [place] [id number] Lindon Romp MD [id number] Performed at [facility]   Prolactin     Status: Abnormal   Collection Time: 02/19/16  6:25 AM  Result Value Ref Range   Prolactin 161.9 (H) 4.8 - 23.3 ng/mL    Comment: (NOTE) Performed At: [facility] [place], [place] [id number] Lindon Romp MD [id number] Performed at [facility]   CBC with Differential/Platelet     Status: Abnormal   Collection Time: 02/19/16  6:25 AM  Result Value Ref Range   WBC 11.6 (H) 4.0 - 10.5 K/uL   RBC 4.56 3.87 - 5.11 MIL/uL   Hemoglobin 13.0 12.0 - 15.0 g/dL   HCT 38.6 36.0 - 46.0 %   MCV 84.6 78.0 - 100.0 fL   MCH 28.5 26.0 - 34.0 pg   MCHC 33.7 30.0 - 36.0 g/dL   RDW 13.6 11.5 - 15.5 %   Platelets 260 150 - 400 K/uL   Neutrophils Relative % 61 %   Neutro Abs 7.1 1.7 - 7.7 K/uL   Lymphocytes Relative 30 %   Lymphs Abs 3.5 0.7 - 4.0 K/uL   Monocytes Relative 5 %   Monocytes Absolute 0.6 0.1 - 1.0 K/uL   Eosinophils Relative 4 %   Eosinophils Absolute 0.4 0.0 - 0.7 K/uL   Basophils Relative 0 %   Basophils Absolute 0.0 0.0 - 0.1 K/uL    Comment: Performed at [facility]  Comprehensive metabolic panel     Status: None   Collection Time: 02/19/16  6:25 AM  Result Value Ref Range   Sodium 141 135 - 145 mmol/L   Potassium 4.1 3.5 - 5.1 mmol/L   Chloride 107 101 - 111 mmol/L   CO2 27 22 - 32 mmol/L   Glucose, Bld 96 65 - 99 mg/dL   BUN 14 6 - 20 mg/dL   Creatinine, Ser 0.75 0.44 - 1.00 mg/dL   Calcium 9.0 8.9 - 10.3 mg/dL   Total Protein 6.7 6.5 - 8.1 g/dL   Albumin 3.5 3.5 - 5.0 g/dL   AST 20 15 - 41 U/L   ALT 32 14 - 54 U/L   Alkaline Phosphatase 49 38 - 126 U/L   Total Bilirubin 0.8 0.3 - 1.2 mg/dL   GFR calc non Af Amer >60 >60 mL/min    GFR calc Af Amer >  60 >60 mL/min    Comment: (NOTE) The eGFR has been calculated using the CKD EPI equation. This calculation has not been validated in all clinical situations. eGFR's persistently <60 mL/min signify possible Chronic Kidney Disease.    Anion gap 7 5 - 15    Comment: Performed at [facility]   Blood Alcohol level:  Lab Results  Component Value Date   ETH 145 (H) 02/16/2016   ETH <5 84/53/6468   Metabolic Disorder Labs: Lab Results  Component Value Date   HGBA1C 5.3 02/19/2016   MPG 105 02/19/2016   MPG 111 08/03/2013   Lab Results  Component Value Date   PROLACTIN 161.9 (H) 02/19/2016   PROLACTIN 112.4 08/03/2013   Lab Results  Component Value Date   CHOL 159 02/18/2016   TRIG 133 02/18/2016   HDL 33 (L) 02/18/2016   CHOLHDL 4.8 02/18/2016   VLDL 27 02/18/2016   LDLCALC 99 02/18/2016   LDLCALC 126 (H) 08/03/2013   Physical Findings: AIMS: Facial and Oral Movements Muscles of Facial Expression: None, normal Lips and Perioral Area: None, normal Jaw: None, normal Tongue: None, normal,Extremity Movements Upper (arms, wrists, hands, fingers): None, normal Lower (legs, knees, ankles, toes): None, normal, Trunk Movements Neck, shoulders, hips: None, normal, Overall Severity Severity of abnormal movements (highest score from questions above): None, normal Incapacitation due to abnormal movements: None, normal Patient's awareness of abnormal movements (rate only patient's report): No Awareness, Dental Status Current problems with teeth and/or dentures?: No Does patient usually wear dentures?: No  CIWA:  CIWA-Ar Total: 1 COWS:  COWS Total Score: 1  Musculoskeletal: Strength & Muscle Tone: within normal limits Gait & Station: normal Patient leans: N/A  Psychiatric Specialty Exam: Physical Exam  Nursing note and vitals reviewed.   Review of Systems  Psychiatric/Behavioral: Positive for depression ("Improving"), hallucinations  (Hx. delusions & paranoia) and substance abuse (Hx. alcohol use disorder). Negative for memory loss and suicidal ideas. The patient is nervous/anxious and has insomnia ("Improving").   All other systems reviewed and are negative.   Blood pressure 119/74, pulse 93, temperature 98.5 F (36.9 C), temperature source Oral, resp. rate 17, height 5' 3.75" (1.619 m), weight 96.2 kg (212 lb), last menstrual period 02/15/2016.Body mass index is 36.68 kg/m.  General Appearance: Fairly Groomed  Eye Contact:  Minimal  Speech:  Normal Rate  Volume:  Increased  Mood:  Angry, Anxious and Irritable  Affect:  Congruent  Thought Process:  Goal Directed and Descriptions of Associations: Circumstantial  Orientation:  Full (Time, Place, and Person)  Thought Content:  Paranoid Ideation and Rumination  Suicidal Thoughts:  No  Homicidal Thoughts:  No  Memory:  Immediate;   Fair Recent;   Fair Remote;   Fair  Judgement:  Impaired  Insight:  Shallow  Psychomotor Activity:  Normal  Concentration:  Concentration: Fair and Attention Span: Fair  Recall:  [employer] of Knowledge:  Fair  Language:  Fair  Akathisia:  No  Handed:  Right  AIMS (if indicated):     Assets:  Physical Health Social Support  ADL's:  Intact  Cognition:  WNL  Sleep:  Number of Hours: 5.5   Treatment Plan Summary: Patient today seen as calm , limited participation in milieu, continue treatment.  Schizophrenia, paranoid, chronic with acute exacerbation ([facility]) unstable  Will continue today 02/20/16 plan as below except where it is noted.  Daily contact with patient to assess and evaluate symptoms and progress in treatment and Medication management  For Schizophrenia: Continue Geodon 80 mg po bid.  For anxiety/agitation: PRN medications as per Unit protocol.  Alcohol use disorder; Provided counseling. CIWA/Ativan prn.  Will continue to monitor vitals ,medication compliance and treatment side effects while patient is here.    Will monitor for medical issues as well as call consult as needed.   Reviewed labs cbc - wbc - 17.2 - will repeat , K+ - 3.3 -  repeat CMP- wnl  , uds - POS FOR bzd - likely from ED, BAL - 145- ,lipid panel- wnl, tsh - wnl.  EKG for qtc - reviewed- wnl .  CSW will continue working on disposition.   Encourage patient to participate in therapeutic milieu .  No changes made on current plan of care. Continue as recommended.  Encarnacion Slates, NP 02/20/2016, 2:50 PMPatient ID: Lamonte Richer, female   DOB: 12/06/1980, 36 y.o.   MRN: [id number] Agree with NP progress note as above

## 2016-02-21 DIAGNOSIS — F17210 Nicotine dependence, cigarettes, uncomplicated: Secondary | ICD-10-CM

## 2016-02-21 DIAGNOSIS — Z9889 Other specified postprocedural states: Secondary | ICD-10-CM

## 2016-02-21 MED ORDER — ZIPRASIDONE HCL 60 MG PO CAPS
60.0000 mg | ORAL_CAPSULE | Freq: Two times a day (BID) | ORAL | Status: DC
  Administered 2016-02-21 – 2016-02-22 (×2): 60 mg via ORAL
  Filled 2016-02-21 (×3): qty 1
  Filled 2016-02-21: qty 3
  Filled 2016-02-21: qty 1

## 2016-02-21 MED ORDER — TRAZODONE HCL 100 MG PO TABS
100.0000 mg | ORAL_TABLET | Freq: Every day | ORAL | Status: DC
  Administered 2016-02-21: 100 mg via ORAL
  Filled 2016-02-21 (×2): qty 1

## 2016-02-21 MED ORDER — ARIPIPRAZOLE 5 MG PO TABS
5.0000 mg | ORAL_TABLET | Freq: Every day | ORAL | Status: DC
Start: 2016-02-21 — End: 2016-02-22
  Administered 2016-02-21: 5 mg via ORAL
  Filled 2016-02-21 (×2): qty 1

## 2016-02-21 NOTE — Progress Notes (Signed)
D: Pt was in the hallway upon initial approach.  Pt presents with anxious affect and mood.  Pt reports her goal is "to be released, be discharged."  Pt denies SI/HI, denies hallucinations, reports pain from headache of 7/10.  Pt has been visible in milieu interacting with peers and staff appropriately.  She has paced the hallway at times.   A: Introduced self to pt.  Actively listened to pt and offered support and encouragement. PRN medication administered for anxiety, pain, and sleep. R: Pt is safe on the unit.  Pt is compliant with medications.  Pt verbally contracts for safety.  Will continue to monitor and assess.

## 2016-02-21 NOTE — Progress Notes (Signed)
Pt reports increased anxiety.  She requested PRN medication from Clinical research associatewriter.  Pt also reported pain from headache of 8/10.  Pt appears to be responding to internal stimuli.  She was mumbling while Clinical research associatewriter was getting medications for pt.  Writer unable to discern what pt was saying.  When writer asked pt what she said, pt states "oh, nothing" and then continues to softly mumble.  She was also laughing inappropriately.  PRN medication administered for severe anxiety and pain.  Will continue to monitor and assess.

## 2016-02-21 NOTE — Progress Notes (Signed)
Recreation Therapy Notes  Date: 02/21/16 Time: 1000 Location: 500 Hall Dayroom  Group Topic: Leisure Education  Goal Area(s) Addresses:  Patient will identify positive leisure activities.  Patient will identify one positive benefit of participation in leisure activities.   Behavioral Response: Engaged  Intervention: Scientist, clinical (histocompatibility and immunogenetics)Construction paper, markers, scissors, glue sticks and magazines  Activity: Leisure PSA.  Patients were to create a public service announcement to explain the benefits of recreation and leisure.  Patients were to also provide examples of various types of recreation and leisure.  Education:  Leisure Education, Building control surveyorDischarge Planning  Education Outcome: Acknowledges education/In group clarification offered/Needs additional education  Clinical Observations/Feedback: Pt arrived late.  Pt was talking to herself during group.  Examples of leisure that pt provided were playing with your kids, dancing, exercise and walking your dog.  Pt stated some of the benefits of leisure were relieving stress, fun times with friends and family time.  Pt expressed using her leisure time effectively would allow her more time to "relax and meditate".    Caroll RancherMarjette Rithwik Schmieg, LRT/CTRS         Lillia AbedLindsay, Abiageal Blowe A 02/21/2016 11:59 AM

## 2016-02-21 NOTE — Progress Notes (Signed)
Patient attended karaoke group this evening.  

## 2016-02-21 NOTE — Progress Notes (Signed)
DAR NOTE: Patient presents with anxious affect and depressed mood. Complained of headache and cough. Denies auditory and visual hallucinations when asked, but was observed responding to internal stimuli.  Rates depression at 0, hopelessness at 0, and anxiety at 0.  Maintained on routine safety checks.  Medications given as prescribed.  Support and encouragement offered as needed.  Attended group and participated.  Patient observed socializing with peers in the dayroom.  Offered no complaint.

## 2016-02-21 NOTE — [facility] Group Notes (Signed)
[facility] Group Notes:  (Counselor/Nursing/MHT/Case Management/Adjunct)  02/21/2016 1:15PM  Type of Therapy:  Group Therapy  Participation Level:  Active  Participation Quality:  Appropriate  Affect:  Flat  Cognitive:  Oriented  Insight:  Improving  Engagement in Group:  Limited  Engagement in Therapy:  Limited  Modes of Intervention:  Discussion, Exploration and Socialization  Summary of Progress/Problems: The topic for group was balance in life.  Pt participated in the discussion about when their life was in balance and out of balance and how this feels.  Pt discussed ways to get back in balance and short term goals they can work on to get where they want to be. Invited.  Chose to not attend.   Angelica [name] B 02/21/2016 2:20 PM   

## 2016-02-21 NOTE — Progress Notes (Signed)
[facility] MD Progress Note  02/21/2016 1:18 PM Angelica [name]  MRN:  [id number]  Subjective: Pt states " I am ok with changing medications to Abilify .'   Objective: Patient seen and chart reviewed.Discussed patient with treatment team.  Pt today seen to be anxious , irritable. Pt per staff had a rough night last night , required PRN medications and her sleep was restless. Pt also seen as responding to internal stimuli often, talking to self . Pt however denies it and states " I am fine."   Principal Problem: Schizophrenia, paranoid, chronic with acute exacerbation ([facility])  Diagnosis:   Patient Active Problem List   Diagnosis Date Noted  . Schizophrenia, paranoid, chronic with acute exacerbation ([facility]) [F20.0] 02/17/2016   Total Time spent with patient: 25 minutes  Past Psychiatric History: Please see H&P.  Past Medical History:  Past Medical History:  Diagnosis Date  . Medical history non-contributory   . Migraine   . No pertinent past medical history   . Paranoid schizophrenia [facility])     Past Surgical History:  Procedure Laterality Date  . CYST REMOVAL NECK    . CYST REMOVAL NECK  2005  . NO PAST SURGERIES     Family History:  Family History  Problem Relation Age of Onset  . Mental illness Neg Hx    Family Psychiatric  History: Please see H&P.  Social History:  History  Alcohol Use No     History  Drug Use No    Social History   Social History  . Marital status: Single    Spouse name: N/A  . Number of children: N/A  . Years of education: N/A   Social History Main Topics  . Smoking status: Current Every Day Smoker    Packs/day: 0.25    Years: 5.00    Types: Cigarettes  . Smokeless tobacco: Never Used  . Alcohol use No  . Drug use: No  . Sexual activity: Yes    Birth control/ protection: Condom   Other Topics Concern  . None   Social History Narrative  . None   Additional Social History:    Pain Medications: see PTAmeds list Prescriptions:  See MARs Over the Counter: see PTA meds list History of alcohol / drug use?: Yes Negative Consequences of Use: Personal relationships Withdrawal Symptoms: Other (Comment) (none)  Sleep: restless  Appetite:  Fair  Current Medications: Current Facility-Administered Medications  Medication Dose Route Frequency Provider Last Rate Last Dose  . acetaminophen (TYLENOL) tablet 650 mg  650 mg Oral Q6H PRN Laveda Abbe, NP   650 mg at 02/21/16 0751  . alum & mag hydroxide-simeth (MAALOX/MYLANTA) 200-200-20 MG/5ML suspension 30 mL  30 mL Oral Q4H PRN Laveda Abbe, NP   30 mL at 02/18/16 1849  . ARIPiprazole (ABILIFY) tablet 5 mg  5 mg Oral QHS Xiong Haidar, MD      . hydrOXYzine (ATARAX/VISTARIL) tablet 25 mg  25 mg Oral Q6H PRN Laveda Abbe, NP   25 mg at 02/20/16 1945  . ibuprofen (ADVIL,MOTRIN) tablet 600 mg  600 mg Oral Q6H PRN Kerry Hough, PA-C   600 mg at 02/20/16 1945  . LORazepam (ATIVAN) tablet 1 mg  1 mg Oral Q4H PRN Jomarie Longs, MD   1 mg at 02/19/16 2220  . magnesium hydroxide (MILK OF MAGNESIA) suspension 30 mL  30 mL Oral Daily PRN Laveda Abbe, NP      . OLANZapine zydis (ZYPREXA) disintegrating tablet 10  mg  10 mg Oral TID PRN Jomarie LongsSaramma Tressie Ragin, MD   10 mg at 02/21/16 0110   Or  . OLANZapine (ZYPREXA) injection 10 mg  10 mg Intramuscular TID PRN Jomarie LongsSaramma Lenna Hagarty, MD      . traZODone (DESYREL) tablet 100 mg  100 mg Oral QHS Tesean Stump, MD      . ziprasidone (GEODON) capsule 60 mg  60 mg Oral BID WC Jomarie LongsSaramma Wrenn Willcox, MD        Lab Results:  No results found for this or any previous visit (from the past 48 hour(s)). Blood Alcohol level:  Lab Results  Component Value Date   ETH 145 (H) 02/16/2016   ETH <5 08/25/2014   Metabolic Disorder Labs: Lab Results  Component Value Date   HGBA1C 5.3 02/19/2016   MPG 105 02/19/2016   MPG 111 08/03/2013   Lab Results  Component Value Date   PROLACTIN 161.9 (H) 02/19/2016   PROLACTIN 112.4  08/03/2013   Lab Results  Component Value Date   CHOL 159 02/18/2016   TRIG 133 02/18/2016   HDL 33 (L) 02/18/2016   CHOLHDL 4.8 02/18/2016   VLDL 27 02/18/2016   LDLCALC 99 02/18/2016   LDLCALC 126 (H) 08/03/2013   Physical Findings: AIMS: Facial and Oral Movements Muscles of Facial Expression: None, normal Lips and Perioral Area: None, normal Jaw: None, normal Tongue: None, normal,Extremity Movements Upper (arms, wrists, hands, fingers): None, normal Lower (legs, knees, ankles, toes): None, normal, Trunk Movements Neck, shoulders, hips: None, normal, Overall Severity Severity of abnormal movements (highest score from questions above): None, normal Incapacitation due to abnormal movements: None, normal Patient's awareness of abnormal movements (rate only patient's report): No Awareness, Dental Status Current problems with teeth and/or dentures?: No Does patient usually wear dentures?: No  CIWA:  CIWA-Ar Total: 0 COWS:  COWS Total Score: 1  Musculoskeletal: Strength & Muscle Tone: within normal limits Gait & Station: normal Patient leans: N/A  Psychiatric Specialty Exam: Physical Exam  Nursing note and vitals reviewed.   Review of Systems  Psychiatric/Behavioral: Positive for depression ("Improving"), hallucinations (Hx. delusions & paranoia) and substance abuse (Hx. alcohol use disorder). Negative for memory loss and suicidal ideas. The patient is nervous/anxious and has insomnia.   All other systems reviewed and are negative.   Blood pressure (!) 122/55, pulse 62, temperature 99 F (37.2 C), temperature source Oral, resp. rate 18, height 5' 3.75" (1.619 m), weight 96.2 kg (212 lb), last menstrual period 02/15/2016.Body mass index is 36.68 kg/m.  General Appearance: Fairly Groomed  Eye Contact:  Fair  Speech:  Pressured  Volume:  Increased  Mood:  Angry, Anxious and Irritable  Affect:  Congruent  Thought Process:  Goal Directed and Descriptions of Associations:  Circumstantial  Orientation:  Full (Time, Place, and Person)  Thought Content:  Paranoid Ideation and Rumination- seen as talking to self, responding to internal stimuli by staff   Suicidal Thoughts:  No  Homicidal Thoughts:  No  Memory:  Immediate;   Fair Recent;   Fair Remote;   Fair  Judgement:  Impaired  Insight:  Lacking  Psychomotor Activity:  Normal  Concentration:  Concentration: Fair and Attention Span: Fair  Recall:  [employer]  Fund of Knowledge:  Fair  Language:  Fair  Akathisia:  No  Handed:  Right  AIMS (if indicated):     Assets:  Physical Health Social Support  ADL's:  Intact  Cognition:  WNL  Sleep:  Number of Hours: 3.5   Treatment  Plan Summary: Patient today seen as irritable , continues to respond to internal stimulin often - as observed by staff. Pt discussed about an LAI - she refuses , but is willing to take Abilify PO. Will start cross tapering her Geodon with Abilify.  Schizophrenia, paranoid, chronic with acute exacerbation ([facility]) unstable  Will continue today 02/21/16 plan as below except where it is noted.  Daily contact with patient to assess and evaluate symptoms and progress in treatment and Medication management For Schizophrenia: Will start cross titrating Geodon with Abilify. Start Abilify 5 mg po qhs.  For insomnia: Start Trazodone 100 mg po qhs.  For anxiety/agitation: PRN medications as per Unit protocol.  Alcohol use disorder; Provided counseling. CIWA/Ativan prn.  Will continue to monitor vitals ,medication compliance and treatment side effects while patient is here.   Will monitor for medical issues as well as call consult as needed.   Reviewed labs cbc - wbc - 17.2 - will repeat , K+ - 3.3 -  repeat CMP- wnl  , uds - POS FOR bzd - likely from ED, BAL - 145- ,lipid panel- wnl, tsh - wnl.  EKG for qtc - reviewed- wnl .  CSW will continue working on disposition.   Encourage patient to participate in therapeutic milieu .   No changes made on current plan of care. Continue as recommended.  Aubrielle Stroud, MD 02/21/2016, 1:18 PMPatient ID: Angelica [name], female   DOB: Aug 01, 1980, 36 y.o.   MRN: [id number]

## 2016-02-22 MED ORDER — ZIPRASIDONE HCL 40 MG PO CAPS
40.0000 mg | ORAL_CAPSULE | Freq: Two times a day (BID) | ORAL | Status: AC
  Administered 2016-02-22: 40 mg via ORAL
  Filled 2016-02-22: qty 1

## 2016-02-22 MED ORDER — ARIPIPRAZOLE 10 MG PO TABS
10.0000 mg | ORAL_TABLET | Freq: Every day | ORAL | Status: DC
  Administered 2016-02-22 – 2016-02-24 (×3): 10 mg via ORAL
  Filled 2016-02-22 (×4): qty 1

## 2016-02-22 MED ORDER — ZIPRASIDONE HCL 40 MG PO CAPS
40.0000 mg | ORAL_CAPSULE | Freq: Every day | ORAL | Status: DC
  Administered 2016-02-23 – 2016-02-24 (×2): 40 mg via ORAL
  Filled 2016-02-22 (×3): qty 1

## 2016-02-22 MED ORDER — TRAZODONE HCL 50 MG PO TABS
125.0000 mg | ORAL_TABLET | Freq: Every day | ORAL | Status: DC
  Administered 2016-02-22 – 2016-02-24 (×3): 125 mg via ORAL
  Filled 2016-02-22 (×4): qty 2.5

## 2016-02-22 NOTE — Progress Notes (Signed)
Patient has been noted to be talking to unseen other.  When patient is confronted about talking she states "I was just talking to myself"  Patient is also noted to be motioning, finger pointing, and laughing at unseen other.  Patient denies SI and HI.  Patient complains of anxiety and is easily agitated when confronted about talking to unseen other.   Assess patient for safety, offer medications as prescribes, engage in 1:1 staff talks.   Continue to monitor as prescribed.  

## 2016-02-22 NOTE — Progress Notes (Signed)
Recreation Therapy Notes  Date: 02/22/16 Time: 1000 Location: 500 Hall Dayroom  Group Topic: Stress Management  Goal Area(s) Addresses:  Patient will verbalize importance of using healthy stress management.  Patient will identify positive emotions associated with healthy stress management.   Behavioral Response: Engaged  Intervention: Stress Management  Activity :  Progressive Muscle Relaxation, [facility] Visualization.  LRT introduced the stress management techniques of progressive muscle relaxation and guided imagery to patients.  LRT read scripts to allow patients to engage and participate in the techniques.  Patients were to follow along as LRT read the scripts to participate.    Education:  Stress Management, Discharge Planning.   Education Outcome: Acknowledges edcuation/In group clarification offered/Needs additional education  Clinical Observations/Feedback:  Pt stated some symptoms of stress are "rubbing head, anxiety, and tense muscles".  Pt was very active and engaged in both techniques.  Pt also stated the visualization was nice and felt like she was really there.    Caroll RancherMarjette Temprance Wyre, LRT/CTRS     Caroll RancherLindsay, Chyenne Sobczak A 02/22/2016 11:46 AM

## 2016-02-22 NOTE — [facility] Group Notes (Signed)
[facility] LCSW Group Therapy  02/22/2016  1:05 PM  Type of Therapy:  Group therapy  Participation Level:  Active  Participation Quality:  Attentive  Affect:  Flat  Cognitive:  Oriented  Insight:  Limited  Engagement in Therapy:  Limited  Modes of Intervention:  Discussion, Socialization  Summary of Progress/Problems:  Chaplain was here to lead a group on themes of hope and courage. "Hope is something that you want."  Here for 10 minutes.  Left and did not return. Angelica [name], Angelica [name] B 02/22/2016 12:35 PM

## 2016-02-22 NOTE — Tx Team (Signed)
Interdisciplinary Treatment and Diagnostic Plan Update  02/22/2016 Time of Session: 11:11 AM  Angelica [name] MRN: [id number]  Principal Diagnosis: Schizophrenia, paranoid, chronic with acute exacerbation ([place])  Secondary Diagnoses: Principal Problem:   Schizophrenia, paranoid, chronic with acute exacerbation ([place])   Current Medications:  Current Facility-Administered Medications  Medication Dose Route Frequency Provider Last Rate Last Dose  . acetaminophen (TYLENOL) tablet 650 mg  650 mg Oral Q6H PRN Ethelene Hal, NP   650 mg at 02/21/16 0751  . alum & mag hydroxide-simeth (MAALOX/MYLANTA) 200-200-20 MG/5ML suspension 30 mL  30 mL Oral Q4H PRN Ethelene Hal, NP   30 mL at 02/18/16 1849  . ARIPiprazole (ABILIFY) tablet 10 mg  10 mg Oral QHS Saramma Eappen, MD      . hydrOXYzine (ATARAX/VISTARIL) tablet 25 mg  25 mg Oral Q6H PRN Ethelene Hal, NP   25 mg at 02/22/16 0118  . ibuprofen (ADVIL,MOTRIN) tablet 600 mg  600 mg Oral Q6H PRN Laverle Hobby, PA-C   600 mg at 02/22/16 6546  . LORazepam (ATIVAN) tablet 1 mg  1 mg Oral Q4H PRN Ursula Alert, MD   1 mg at 02/19/16 2220  . magnesium hydroxide (MILK OF MAGNESIA) suspension 30 mL  30 mL Oral Daily PRN Ethelene Hal, NP      . OLANZapine zydis (ZYPREXA) disintegrating tablet 10 mg  10 mg Oral TID PRN Ursula Alert, MD   10 mg at 02/21/16 0110   Or  . OLANZapine (ZYPREXA) injection 10 mg  10 mg Intramuscular TID PRN Ursula Alert, MD      . traZODone (DESYREL) tablet 100 mg  100 mg Oral QHS Saramma Eappen, MD   100 mg at 02/21/16 2200  . ziprasidone (GEODON) capsule 40 mg  40 mg Oral BID WC Ursula Alert, MD      . Derrill Memo ON 02/23/2016] ziprasidone (GEODON) capsule 40 mg  40 mg Oral Q supper Ursula Alert, MD        PTA Medications: Prescriptions Prior to Admission  Medication Sig Dispense Refill Last Dose  . Paliperidone Palmitate 156 MG/ML SUSP Inject 1 mL (156 mg total) into the muscle every 30  (thirty) days. First dose of 234 mg received on 08/05/13 with next dose of 156 mg due in thirty days. (Patient not taking: Reported on 02/16/2016) 0.9 mL 3 Not Taking at Unknown time  . ziprasidone (GEODON) 60 MG capsule Take 60 mg by mouth 2 (two) times daily with a meal.       Treatment Modalities: Medication Management, Group therapy, Case management,  1 to 1 session with clinician, Psychoeducation, Recreational therapy.   Physician Treatment Plan for Primary Diagnosis: Schizophrenia, paranoid, chronic with acute exacerbation ([place]) Long Term Goal(s): Improvement in symptoms so as ready for discharge  Short Term Goals: Ability to verbalize feelings will improve   Medication Management: Evaluate patient's response, side effects, and tolerance of medication regimen.  Therapeutic Interventions: 1 to 1 sessions, Unit Group sessions and Medication administration.  Evaluation of Outcomes: Progressing  Physician Treatment Plan for Secondary Diagnosis: Principal Problem:   Schizophrenia, paranoid, chronic with acute exacerbation ([place])   Long Term Goal(s): Improvement in symptoms so as ready for discharge  Short Term Goals:  Compliance with prescribed medications will improve  Medication Management: Evaluate patient's response, side effects, and tolerance of medication regimen.  Therapeutic Interventions: 1 to 1 sessions, Unit Group sessions and Medication administration.  Evaluation of Outcomes: Progressing   1/5: Patient today seen  as irritable , continues to respond to internal stimuli often - as observed by staff.  Discussed  LAI - she refuses , but is willing to take Abilify PO. Will start cross tapering her Geodon with Abilify.  For Schizophrenia: Will start cross titrating Geodon with Abilify. Start Abilify 5 mg po qhs.  For insomnia: Start Trazodone 100 mg po qhs.   RN Treatment Plan for Primary Diagnosis: Schizophrenia, paranoid, chronic with acute exacerbation  ([place]) Long Term Goal(s): Knowledge of disease and therapeutic regimen to maintain health will improve  Short Term Goals: Ability to verbalize frustration and anger appropriately will improve and Compliance with prescribed medications will improve  Medication Management: RN will administer medications as ordered by provider, will assess and evaluate patient's response and provide education to patient for prescribed medication. RN will report any adverse and/or side effects to prescribing provider.  Therapeutic Interventions: 1 on 1 counseling sessions, Psychoeducation, Medication administration, Evaluate responses to treatment, Monitor vital signs and CBGs as ordered, Perform/monitor CIWA, COWS, AIMS and Fall Risk screenings as ordered, Perform wound care treatments as ordered.  Evaluation of Outcomes: Progressing   Recreational Therapy Treatment Plan for Primary Diagnosis: Schizophrenia, paranoid, chronic with acute exacerbation ([place]) Long Term Goal(s):  LTG- Patient will participate in recreation therapy tx in at least 2 group sessions without prompting from LRT.  Short Term Goals: Pt will be able to identify at least 5 coping skills for admitting dx by conclusion of recreation therapy tx.  Treatment Modalities: Group and Pet Therapy  Therapeutic Interventions: Psychoeducation  Evaluation of Outcomes: Progressing   LCSW Treatment Plan for Primary Diagnosis: Schizophrenia, paranoid, chronic with acute exacerbation ([place]) Long Term Goal(s): Safe transition to appropriate next level of care at discharge, Engage patient in therapeutic group addressing interpersonal concerns.  Short Term Goals: Engage patient in aftercare planning with referrals and resources  Therapeutic Interventions: Assess for all discharge needs, 1 to 1 time with Social worker, Explore available resources and support systems, Assess for adequacy in community support network, Educate family and significant other(s) on  suicide prevention, Complete Psychosocial Assessment, Interpersonal group therapy.  Evaluation of Outcomes: Met   Progress in Treatment: Attending groups: Intermittently Participating in groups: Minimally Taking medication as prescribed: Yes Toleration medication: Yes, no side effects reported at this time Family/Significant other contact made: Yes Patient understands diagnosis: No  Limited insight Discussing patient identified problems/goals with staff: Yes Medical problems stabilized or resolved: Yes Denies suicidal/homicidal ideation: Yes Issues/concerns per patient self-inventory: None Other: N/A  New problem(s) identified: None identified at this time.   New Short Term/Long Term Goal(s): None identified at this time.   Discharge Plan or Barriers: Return home, follow up outpt  Reason for Continuation of Hospitalization: RIS Hallucinations Medication stabilization   Estimated Length of Stay: 3-5 days  Attendees: Patient: 02/22/2016  11:11 AM  Physician: Ursula Alert, MD 02/22/2016  11:11 AM  Nursing: Hoy Register, RN 02/22/2016  11:11 AM  RN Care Manager: Lars Pinks, RN 02/22/2016  11:11 AM  Social Worker: Ripley Fraise 02/22/2016  11:11 AM  Recreational Therapist: Almarosa Bohac  02/22/2016  11:11 AM  Other: Norberto Sorenson 02/22/2016  11:11 AM  Other:  02/22/2016  11:11 AM    Scribe for Treatment Team:  Roque Lias LCSW 02/22/2016 11:11 AM

## 2016-02-22 NOTE — Progress Notes (Signed)
D: Patient isolated in her room. Observed responding to internal stimuli - talking and laughing to self. Denies pain, SI/HI, AH/VH at this time. Verbalizes no concern. No behavioral issues noted.  A: Staff offered support and encouragement as needed. Due/prn meds given as ordered. Routine safety needs maintained. Will continue to monitor patient for safety and stability. R: Patient remains safe.

## 2016-02-22 NOTE — Progress Notes (Addendum)
[facility] MD Progress Note  02/22/2016 11:16 AM Angelica [name]  MRN:  [id number]  Subjective: Pt states " I AM FINE."   Objective: Patient seen and chart reviewed.Discussed patient with treatment team.  Pt today seen as talking , mumbling to self often when unobserved . Pt continues to be vaguely irritable - requires PRN medications to calm self down. Pt has been tolerating the Abilify - will continue to cross titrate. Pt encouraged to attend groups and participate in milieu.      Principal Problem: Schizophrenia, paranoid, chronic with acute exacerbation (HCC)  Diagnosis:   Patient Active Problem List   Diagnosis Date Noted  . Schizophrenia, paranoid, chronic with acute exacerbation (HCC) [F20.0] 02/17/2016   Total Time spent with patient: 25 minutes  Past Psychiatric History: Please see H&P.  Past Medical History:  Past Medical History:  Diagnosis Date  . Medical history non-contributory   . Migraine   . No pertinent past medical history   . Paranoid schizophrenia [facility])     Past Surgical History:  Procedure Laterality Date  . CYST REMOVAL NECK    . CYST REMOVAL NECK  2005  . NO PAST SURGERIES     Family History:  Family History  Problem Relation Age of Onset  . Mental illness Neg Hx    Family Psychiatric  History: Please see H&P.  Social History:  History  Alcohol Use No     History  Drug Use No    Social History   Social History  . Marital status: Single    Spouse name: N/A  . Number of children: N/A  . Years of education: N/A   Social History Main Topics  . Smoking status: Current Every Day Smoker    Packs/day: 0.25    Years: 5.00    Types: Cigarettes  . Smokeless tobacco: Never Used  . Alcohol use No  . Drug use: No  . Sexual activity: Yes    Birth control/ protection: Condom   Other Topics Concern  . None   Social History Narrative  . None   Additional Social History:    Pain Medications: see PTAmeds list Prescriptions: See  MARs Over the Counter: see PTA meds list History of alcohol / drug use?: Yes Negative Consequences of Use: Personal relationships Withdrawal Symptoms: Other (Comment) (none)  Sleep: restless  Appetite:  Fair  Current Medications: Current Facility-Administered Medications  Medication Dose Route Frequency Provider Last Rate Last Dose  . acetaminophen (TYLENOL) tablet 650 mg  650 mg Oral Q6H PRN Laveda Abbe, NP   650 mg at 02/21/16 0751  . alum & mag hydroxide-simeth (MAALOX/MYLANTA) 200-200-20 MG/5ML suspension 30 mL  30 mL Oral Q4H PRN Laveda Abbe, NP   30 mL at 02/18/16 1849  . ARIPiprazole (ABILIFY) tablet 10 mg  10 mg Oral QHS Lisle Skillman, MD      . hydrOXYzine (ATARAX/VISTARIL) tablet 25 mg  25 mg Oral Q6H PRN Laveda Abbe, NP   25 mg at 02/22/16 0118  . ibuprofen (ADVIL,MOTRIN) tablet 600 mg  600 mg Oral Q6H PRN Kerry Hough, PA-C   600 mg at 02/22/16 9528  . LORazepam (ATIVAN) tablet 1 mg  1 mg Oral Q4H PRN Jomarie Longs, MD   1 mg at 02/19/16 2220  . magnesium hydroxide (MILK OF MAGNESIA) suspension 30 mL  30 mL Oral Daily PRN Laveda Abbe, NP      . OLANZapine zydis (ZYPREXA) disintegrating tablet 10 mg  10 mg  Oral TID PRN Jomarie LongsSaramma Toluwani Yadav, MD   10 mg at 02/21/16 0110   Or  . OLANZapine (ZYPREXA) injection 10 mg  10 mg Intramuscular TID PRN Jomarie LongsSaramma Richie Bonanno, MD      . traZODone (DESYREL) tablet 125 mg  125 mg Oral QHS Tiwana Chavis, MD      . ziprasidone (GEODON) capsule 40 mg  40 mg Oral BID WC Jomarie LongsSaramma Dima Mini, MD      . Melene Muller[START ON 02/23/2016] ziprasidone (GEODON) capsule 40 mg  40 mg Oral Q supper Jomarie LongsSaramma Zayn Selley, MD        Lab Results:  No results found for this or any previous visit (from the past 48 hour(s)). Blood Alcohol level:  Lab Results  Component Value Date   ETH 145 (H) 02/16/2016   ETH <5 08/25/2014   Metabolic Disorder Labs: Lab Results  Component Value Date   HGBA1C 5.3 02/19/2016   MPG 105 02/19/2016   MPG 111  08/03/2013   Lab Results  Component Value Date   PROLACTIN 161.9 (H) 02/19/2016   PROLACTIN 112.4 08/03/2013   Lab Results  Component Value Date   CHOL 159 02/18/2016   TRIG 133 02/18/2016   HDL 33 (L) 02/18/2016   CHOLHDL 4.8 02/18/2016   VLDL 27 02/18/2016   LDLCALC 99 02/18/2016   LDLCALC 126 (H) 08/03/2013   Physical Findings: AIMS: Facial and Oral Movements Muscles of Facial Expression: None, normal Lips and Perioral Area: None, normal Jaw: None, normal Tongue: None, normal,Extremity Movements Upper (arms, wrists, hands, fingers): None, normal Lower (legs, knees, ankles, toes): None, normal, Trunk Movements Neck, shoulders, hips: None, normal, Overall Severity Severity of abnormal movements (highest score from questions above): None, normal Incapacitation due to abnormal movements: None, normal Patient's awareness of abnormal movements (rate only patient's report): No Awareness, Dental Status Current problems with teeth and/or dentures?: No Does patient usually wear dentures?: No  CIWA:  CIWA-Ar Total: 6 COWS:  COWS Total Score: 1  Musculoskeletal: Strength & Muscle Tone: within normal limits Gait & Station: normal Patient leans: N/A  Psychiatric Specialty Exam: Physical Exam  Nursing note and vitals reviewed.   Review of Systems  Psychiatric/Behavioral: Positive for depression ("Improving"), hallucinations (Hx. delusions & paranoia) and substance abuse (Hx. alcohol use disorder). Negative for memory loss and suicidal ideas. The patient is nervous/anxious and has insomnia.   All other systems reviewed and are negative.   Blood pressure 109/63, pulse 69, temperature 97.8 F (36.6 C), temperature source Oral, resp. rate 16, height 5' 3.75" (1.619 m), weight 96.2 kg (212 lb), last menstrual period 02/15/2016.Body mass index is 36.68 kg/m.  General Appearance: Fairly Groomed  Eye Contact:  Fair  Speech:  Pressured  Volume:  Increased  Mood:  Angry, Anxious and  Irritable  Affect:  Congruent  Thought Process:  Goal Directed and Descriptions of Associations: Circumstantial  Orientation:  Full (Time, Place, and Person)  Thought Content:  Paranoid Ideation and Rumination- seen as responding to internal stimuli - mumbling often when unobserved  Suicidal Thoughts:  No  Homicidal Thoughts:  No  Memory:  Immediate;   Fair Recent;   Fair Remote;   Fair  Judgement:  Impaired  Insight:  Lacking  Psychomotor Activity:  Normal  Concentration:  Concentration: Fair and Attention Span: Fair  Recall:  [employer]Fair  Fund of Knowledge:  Fair  Language:  Fair  Akathisia:  No  Handed:  Right  AIMS (if indicated):     Assets:  Physical Health Social Support  ADL's:  Intact  Cognition:  WNL  Sleep:  Number of Hours: 2.75   Treatment Plan Summary: Patient today seen as irritable , continues to respond to internal stimulin often - as observed by staff. Pt discussed about an LAI - she refuses , but is willing to take Abilify PO. Will start cross tapering her Geodon with Abilify.  Schizophrenia, paranoid, chronic with acute exacerbation (HCC) unstable  Will continue today 02/22/16 plan as below except where it is noted.  Daily contact with patient to assess and evaluate symptoms and progress in treatment and Medication management For Schizophrenia: Will continue cross titrating Geodon with Abilify. Increase Abilify to 10  mg po qhs.  For insomnia: Increase Trazodone to 125 mg po qhs.  For anxiety/agitation: PRN medications as per Unit protocol.  Alcohol use disorder; Provided counseling. CIWA/Ativan prn.  Will continue to monitor vitals ,medication compliance and treatment side effects while patient is here.   Will monitor for medical issues as well as call consult as needed.   Reviewed labs cbc - wbc - 17.2 - will repeat , K+ - 3.3 -  repeat CMP- wnl  , uds - POS FOR bzd - likely from ED, BAL - 145- ,lipid panel- wnl, tsh - wnl.  EKG for qtc -  reviewed- wnl .  CSW will continue working on disposition.   Encourage patient to participate in therapeutic milieu .    Lazar Tierce, MD 02/22/2016, 11:16 AMPatient ID: Kevan Rosebush, female   DOB: Oct 25, 1980, 36 y.o.   MRN: [id number]

## 2016-02-23 NOTE — Progress Notes (Signed)
[facility] MD Progress Note  02/23/2016 2:00 PM Kyra Laffey  MRN:  [id number]  Subjective: Pt states " I feel good today. I don't really need to stay here you know."  Objective: Pt seen and chart reviewed. Pt is alert/oriented x4, calm, cooperative, and appropriate to situation. Pt denies suicidal/homicidal ideation and psychosis. Pt is still somewhat irritable but improving. She appears to be internally preoccupied but is more calm today in comparison to other notes.     Principal Problem: Schizophrenia, paranoid, chronic with acute exacerbation (HCC)  Diagnosis:   Patient Active Problem List   Diagnosis Date Noted  . Schizophrenia, paranoid, chronic with acute exacerbation (HCC) [F20.0] 02/17/2016   Total Time spent with patient: 15 minutes  Past Psychiatric History: Please see H&P.  Past Medical History:  Past Medical History:  Diagnosis Date  . Medical history non-contributory   . Migraine   . No pertinent past medical history   . Paranoid schizophrenia [facility])     Past Surgical History:  Procedure Laterality Date  . CYST REMOVAL NECK    . CYST REMOVAL NECK  2005  . NO PAST SURGERIES     Family History:  Family History  Problem Relation Age of Onset  . Mental illness Neg Hx    Family Psychiatric  History: Please see H&P.  Social History:  History  Alcohol Use No     History  Drug Use No    Social History   Social History  . Marital status: Single    Spouse name: N/A  . Number of children: N/A  . Years of education: N/A   Social History Main Topics  . Smoking status: Current Every Day Smoker    Packs/day: 0.25    Years: 5.00    Types: Cigarettes  . Smokeless tobacco: Never Used  . Alcohol use No  . Drug use: No  . Sexual activity: Yes    Birth control/ protection: Condom   Other Topics Concern  . None   Social History Narrative  . None   Additional Social History:    Pain Medications: see PTAmeds list Prescriptions: See MARs Over the  Counter: see PTA meds list History of alcohol / drug use?: Yes Negative Consequences of Use: Personal relationships Withdrawal Symptoms: Other (Comment) (none)  Sleep: Fair  Appetite:  Fair  Current Medications: Current Facility-Administered Medications  Medication Dose Route Frequency Provider Last Rate Last Dose  . acetaminophen (TYLENOL) tablet 650 mg  650 mg Oral Q6H PRN Laveda Abbe, NP   650 mg at 02/23/16 4098  . alum & mag hydroxide-simeth (MAALOX/MYLANTA) 200-200-20 MG/5ML suspension 30 mL  30 mL Oral Q4H PRN Laveda Abbe, NP   30 mL at 02/18/16 1849  . ARIPiprazole (ABILIFY) tablet 10 mg  10 mg Oral QHS Jomarie Longs, MD   10 mg at 02/22/16 2157  . hydrOXYzine (ATARAX/VISTARIL) tablet 25 mg  25 mg Oral Q6H PRN Laveda Abbe, NP   25 mg at 02/22/16 0118  . ibuprofen (ADVIL,MOTRIN) tablet 600 mg  600 mg Oral Q6H PRN Kerry Hough, PA-C   600 mg at 02/22/16 1191  . LORazepam (ATIVAN) tablet 1 mg  1 mg Oral Q4H PRN Jomarie Longs, MD   1 mg at 02/19/16 2220  . magnesium hydroxide (MILK OF MAGNESIA) suspension 30 mL  30 mL Oral Daily PRN Laveda Abbe, NP      . OLANZapine zydis (ZYPREXA) disintegrating tablet 10 mg  10 mg Oral TID PRN  Jomarie Longs, MD   10 mg at 02/22/16 1839   Or  . OLANZapine (ZYPREXA) injection 10 mg  10 mg Intramuscular TID PRN Jomarie Longs, MD      . traZODone (DESYREL) tablet 125 mg  125 mg Oral QHS Jomarie Longs, MD   125 mg at 02/22/16 2157  . ziprasidone (GEODON) capsule 40 mg  40 mg Oral Q supper Jomarie Longs, MD        Lab Results:  No results found for this or any previous visit (from the past 48 hour(s)). Blood Alcohol level:  Lab Results  Component Value Date   ETH 145 (H) 02/16/2016   ETH <5 08/25/2014   Metabolic Disorder Labs: Lab Results  Component Value Date   HGBA1C 5.3 02/19/2016   MPG 105 02/19/2016   MPG 111 08/03/2013   Lab Results  Component Value Date   PROLACTIN 161.9 (H) 02/19/2016    PROLACTIN 112.4 08/03/2013   Lab Results  Component Value Date   CHOL 159 02/18/2016   TRIG 133 02/18/2016   HDL 33 (L) 02/18/2016   CHOLHDL 4.8 02/18/2016   VLDL 27 02/18/2016   LDLCALC 99 02/18/2016   LDLCALC 126 (H) 08/03/2013   Physical Findings: AIMS: Facial and Oral Movements Muscles of Facial Expression: None, normal Lips and Perioral Area: None, normal Jaw: None, normal Tongue: None, normal,Extremity Movements Upper (arms, wrists, hands, fingers): None, normal Lower (legs, knees, ankles, toes): None, normal, Trunk Movements Neck, shoulders, hips: None, normal, Overall Severity Severity of abnormal movements (highest score from questions above): None, normal Incapacitation due to abnormal movements: None, normal Patient's awareness of abnormal movements (rate only patient's report): No Awareness, Dental Status Current problems with teeth and/or dentures?: No Does patient usually wear dentures?: No  CIWA:  CIWA-Ar Total: 1 COWS:  COWS Total Score: 1  Musculoskeletal: Strength & Muscle Tone: within normal limits Gait & Station: normal Patient leans: N/A  Psychiatric Specialty Exam: Physical Exam  Nursing note and vitals reviewed.   Review of Systems  Psychiatric/Behavioral: Positive for depression ("Improving"), hallucinations (Hx. delusions & paranoia) and substance abuse (Hx. alcohol use disorder). Negative for memory loss and suicidal ideas. The patient is nervous/anxious and has insomnia.   All other systems reviewed and are negative.   Blood pressure (!) 108/53, pulse 62, temperature 98.9 F (37.2 C), temperature source Oral, resp. rate 18, height 5' 3.75" (1.619 m), weight 96.2 kg (212 lb), last menstrual period 02/15/2016.Body mass index is 36.68 kg/m.  General Appearance: Casual and Fairly Groomed  Eye Contact:  Good  Speech:  Pressured yet improving  Volume:  Normal  Mood:  Anxious and Irritable  Affect:  Congruent  Thought Process:  Goal Directed and  Descriptions of Associations: Circumstantial  Orientation:  Full (Time, Place, and Person)  Thought Content:  Paranoid Ideation and Rumination- yet improving  Suicidal Thoughts:  No  Homicidal Thoughts:  No  Memory:  Immediate;   Fair Recent;   Fair Remote;   Fair  Judgement:  Impaired  Insight:  Lacking  Psychomotor Activity:  Normal  Concentration:  Concentration: Fair and Attention Span: Fair  Recall:  [employer] of Knowledge:  Fair  Language:  Fair  Akathisia:  No  Handed:  Right  AIMS (if indicated):     Assets:  Physical Health Social Support  ADL's:  Intact  Cognition:  WNL  Sleep:  Number of Hours: 3.75   Treatment Plan Summary:  Schizophrenia, paranoid, chronic with acute exacerbation (HCC) unstable  Will continue today 02/23/16 plan as below except where it is noted.  Daily contact with patient to assess and evaluate symptoms and progress in treatment and Medication management For Schizophrenia: Will continue cross titrating Geodon with Abilify. Increase Abilify to 10  mg po qhs.  For insomnia: Increase Trazodone to 125 mg po qhs.  For anxiety/agitation: PRN medications as per Unit protocol.  Alcohol use disorder; Provided counseling. CIWA/Ativan prn.  Will continue to monitor vitals ,medication compliance and treatment side effects while patient is here.   Will monitor for medical issues as well as call consult as needed.   Reviewed labs cbc - wbc - 17.2 - will repeat , K+ - 3.3 -  repeat CMP- wnl  , uds - POS FOR bzd - likely from ED, BAL - 145- ,lipid panel- wnl, tsh - wnl.  EKG for qtc - reviewed- wnl .  CSW will continue working on disposition.   Encourage patient to participate in therapeutic milieu .    Beau FannyWithrow, John C, FNP 02/23/2016, 2:00 PM  I agree to notes and plan

## 2016-02-23 NOTE — Progress Notes (Signed)
Did not attend group 

## 2016-02-23 NOTE — Progress Notes (Signed)
  D: Patient has a flat affect on approach tonight. Reports some dizziness when first waking. Encouraged to drink more fluids. Medications increased today. Gait steady at present. Denies any issues tonight. Minimal conversation. Wants lights and door open tonight. Possibly drowsy from Geodon given earlier. Staff reports talking to self when awake.  A: Staff will monitor on q 15 minute checks, follow treatment plan, and give medications as ordered. R: Took medications without issue tonight. Cooperative

## 2016-02-23 NOTE — [facility] Group Notes (Signed)
[facility] Group Notes:  (Clinical Social Work)  02/23/2016  11:15-12:00PM  Summary of Progress/Problems:   Today's process group involved patients discussing their feelings related to being hospitalized, as well as benefits they see to being in the hospital.  These were itemized on the whiteboard, and then the group brainstormed specific ways in which they could seek for those same benefits to happen when they discharge and go back home. The patient expressed a primary feeling about being hospitalized is good, because the medications she was on have been "fixed" so they help her better.  She stated she feels she is persistent and this will help her to stay well.  She also argued with another pt when CSW attempted to point out the difference between saying "I am bipolar" versus "I have bipolar disorder."  She often put her hand over her mouth and started talking loudly behind her hand.  What she was saying could not be understood, and each time CSW asked her to stop, she did so.  Type of Therapy:  Group Therapy - Process  Participation Level:  Active  Participation Quality:  Attentive, Sharing and Distracting  Affect:  Blunted and Labile  Cognitive:  Disorganized and Delusional  Insight:  Improving  Engagement in Therapy:  Developing/Improving  Modes of Intervention:  Exploration, Discussion  Ambrose MantleMareida Grossman-Orr, LCSW 02/23/2016, 2:36 PM

## 2016-02-23 NOTE — Progress Notes (Signed)
Adult Psychoeducational Group Note  Date:  02/23/2016 Time:  2:03 PM  Group Topic/Focus:  Coping With Mental Health Crisis:   The purpose of this group is to help patients identify strategies for coping with mental health crisis.  Group discusses possible causes of crisis and ways to manage them effectively.   Participation Level:  Did Not Attend  Altamese Cabalheresa A Kaleia Longhi 02/23/2016, 2:03 PM

## 2016-02-23 NOTE — Progress Notes (Signed)
Pt remains cooperative with care but continues to have animated conversations with an internal stimuli. At times pt is aggressive in her conversations using words like "that hoe thinks she's a nurse" or "she think she sexy! She thinks so" in a menacing tone but does not outwardly target anyone that can be seen. When spoken to, pt is very pleasant and calm. Was medicated for c/o migraine headaches twice today once with Tylenol and another time with Ibuprofen. Both had fair results.

## 2016-02-23 NOTE — Progress Notes (Signed)
D:Pt presented in a pleasant mood today. Was seen and heard in her room having a very animated conversation with herself and appeared to be responding to internal stimulation. Denied SI/HI but does endorse Ah.  A: Safety checks maintained.  R: Did not attend group but coopertaive with meds.

## 2016-02-24 NOTE — Progress Notes (Signed)
Writer entered patients room earlier after shift change and observed her lying in her bed resting but not asleep. Writer introduced self to her and informed her of her scheduled medications for tonight. Writer also followed up on her headache pain. She was pleasant and informed writer that she would let me know when she was ready to take her medications. She did not attend group and has been isolative to her room. Support given and safety maintained on unit with 15 min checks.

## 2016-02-24 NOTE — Plan of Care (Signed)
Problem: Medication: Goal: Compliance with prescribed medication regimen will improve Outcome: Progressing Patient was compliant with scheduled medications.    

## 2016-02-24 NOTE — [facility] Group Notes (Signed)
[facility] Group Notes: (Clinical Social Work)   02/24/2016      Type of Therapy:  Group Therapy   Participation Level:  Did Not Attend despite MHT prompting   Demauri Advincula Grossman-Orr, LCSW 02/24/2016, 11:53 AM     

## 2016-02-24 NOTE — Progress Notes (Signed)
Pt did not attend group. 

## 2016-02-24 NOTE — Progress Notes (Addendum)
    D [name] is OOB UAL on the 400 hall today..tolerated fair. A She completed her daily assessment and on it she wrote  She deneid SI and she rated her depression, hopelessness and anxeity " 0/0/0/", respectively. SHe is seen talking to " somebody" when she is in her room. She is observed by staff standing in her room... Alone.. having animated conversations with people that are not present.  She denies doing this to this wirtier, when she is asked.she is somewhat isolative and guarded. With a bizarre affect. She does take her medication.  R Safety in place .

## 2016-02-24 NOTE — Progress Notes (Signed)
[facility]  02/24/2016 10:43 AM Kevan RosebushMaria Antoinette Mcquiston  MRN:  [id number]  Subjective: Patient reports " I am okay today."  Objective: Terance HartMaria Antoinette Vowell is awake, alert and oriented * 3. Seen resting in her bedroom talking to herself. Patient  denies suicidal or homicidal ideation. Denies auditory or visual hallucination. However was observed responding to internal stimuli. Patient reports she is journaling her feelings about life. Patient reports she is medication compliant and is tolerating medication well. States she has a agreement with Dr. Elna BreslowEappen to take Geodon and Abilify only.  Reports good appetite and states she is  resting well. Support, encouragement and reassurance was provided.   Principal Problem: Schizophrenia, paranoid, chronic with acute exacerbation (HCC)  Diagnosis:   Patient Active Problem List   Diagnosis Date Noted  . Schizophrenia, paranoid, chronic with acute exacerbation (HCC) [F20.0] 02/17/2016   Total Time spent with patient: 25 minutes  Past Psychiatric History: Please see H&P.  Past Medical History:  Past Medical History:  Diagnosis Date  . Medical history non-contributory   . Migraine   . No pertinent past medical history   . Paranoid schizophrenia [facility](HCC)     Past Surgical History:  Procedure Laterality Date  . CYST REMOVAL NECK    . CYST REMOVAL NECK  2005  . NO PAST SURGERIES     Family History:  Family History  Problem Relation Age of Onset  . Mental illness Neg Hx    Family Psychiatric  History: Please see H&P.  Social History:  History  Alcohol Use No     History  Drug Use No    Social History   Social History  . Marital status: Single    Spouse name: N/A  . Number of children: N/A  . Years of education: N/A   Social History Main Topics  . Smoking status: Current Every Day Smoker    Packs/day: 0.25    Years: 5.00    Types: Cigarettes  . Smokeless tobacco: Never Used  . Alcohol use No  . Drug use: No  .  Sexual activity: Yes    Birth control/ protection: Condom   Other Topics Concern  . None   Social History Narrative  . None   Additional Social History:    Pain Medications: see PTAmeds list Prescriptions: See MARs Over the Counter: see PTA meds list History of alcohol / drug use?: Yes Negative Consequences of Use: Personal relationships Withdrawal Symptoms: Other (Comment) (none)  Sleep: restless  Appetite:  Fair  Current Medications: Current Facility-Administered Medications  Medication Dose Route Frequency Provider Last Rate Last Dose  . acetaminophen (TYLENOL) tablet 650 mg  650 mg Oral Q6H PRN Laveda AbbeLaurie Britton Parks, NP   650 mg at 02/24/16 [id number]  . alum & mag hydroxide-simeth (MAALOX/MYLANTA) 200-200-20 MG/5ML suspension 30 mL  30 mL Oral Q4H PRN Laveda AbbeLaurie Britton Parks, NP   30 mL at 02/18/16 1849  . ARIPiprazole (ABILIFY) tablet 10 mg  10 mg Oral QHS Saramma Eappen, MD   10 mg at 02/23/16 2314  . hydrOXYzine (ATARAX/VISTARIL) tablet 25 mg  25 mg Oral Q6H PRN Laveda AbbeLaurie Britton Parks, NP   25 mg at 02/24/16 0252  . ibuprofen (ADVIL,MOTRIN) tablet 600 mg  600 mg Oral Q6H PRN Kerry HoughSpencer E Simon, PA-C   600 mg at 02/23/16 1819  . LORazepam (ATIVAN) tablet 1 mg  1 mg Oral Q4H PRN Jomarie LongsSaramma Eappen, MD   1 mg at 02/19/16 2220  . magnesium hydroxide (MILK OF  MAGNESIA) suspension 30 mL  30 mL Oral Daily PRN Laveda Abbe, NP      . OLANZapine zydis (ZYPREXA) disintegrating tablet 10 mg  10 mg Oral TID PRN Jomarie Longs, MD   10 mg at 02/24/16 0927   Or  . OLANZapine (ZYPREXA) injection 10 mg  10 mg Intramuscular TID PRN Jomarie Longs, MD      . traZODone (DESYREL) tablet 125 mg  125 mg Oral QHS Jomarie Longs, MD   125 mg at 02/23/16 2313  . ziprasidone (GEODON) capsule 40 mg  40 mg Oral Q supper Jomarie Longs, MD   40 mg at 02/23/16 1723    Lab Results:  No results found for this or any previous visit (from the past 48 hour(s)). Blood Alcohol level:  Lab Results  Component Value  Date   ETH 145 (H) 02/16/2016   ETH <5 08/25/2014   Metabolic Disorder Labs: Lab Results  Component Value Date   HGBA1C 5.3 02/19/2016   MPG 105 02/19/2016   MPG 111 08/03/2013   Lab Results  Component Value Date   PROLACTIN 161.9 (H) 02/19/2016   PROLACTIN 112.4 08/03/2013   Lab Results  Component Value Date   CHOL 159 02/18/2016   TRIG 133 02/18/2016   HDL 33 (L) 02/18/2016   CHOLHDL 4.8 02/18/2016   VLDL 27 02/18/2016   LDLCALC 99 02/18/2016   LDLCALC 126 (H) 08/03/2013   Physical Findings: AIMS: Facial and Oral Movements Muscles of Facial Expression: None, normal Lips and Perioral Area: None, normal Jaw: None, normal Tongue: None, normal,Extremity Movements Upper (arms, wrists, hands, fingers): None, normal Lower (legs, knees, ankles, toes): None, normal, Trunk Movements Neck, shoulders, hips: None, normal, Overall Severity Severity of abnormal movements (highest score from questions above): None, normal Incapacitation due to abnormal movements: None, normal Patient's awareness of abnormal movements (rate only patient's report): No Awareness, Dental Status Current problems with teeth and/or dentures?: No Does patient usually wear dentures?: No  CIWA:  CIWA-Ar Total: 1 COWS:  COWS Total Score: 1  Musculoskeletal: Strength & Muscle Tone: within normal limits Gait & Station: normal Patient leans: N/A  Psychiatric Specialty Exam: Physical Exam  Nursing note and vitals reviewed. Constitutional: She is oriented to person, place, and time.  Cardiovascular: Normal rate.   Neurological: She is alert and oriented to person, place, and time.  Psychiatric: She has a normal mood and affect. Her behavior is normal.    Review of Systems  Psychiatric/Behavioral: Positive for depression ("Improving"), hallucinations (Hx. delusions & paranoia) and substance abuse (Hx. alcohol use disorder). Negative for memory loss and suicidal ideas. The patient is nervous/anxious.  Insomnia: improving.   All other systems reviewed and are negative.   Blood pressure 119/75, pulse (!) 101, temperature 98 F (36.7 C), temperature source Oral, resp. rate 20, height 5' 3.75" (1.619 m), weight 96.2 kg (212 lb), last menstrual period 02/15/2016.Body mass index is 36.68 kg/m.  General Appearance: Fairly Groomed paper scrubs  Eye Contact:  Fair  Speech:  Clear and Coherent  Volume:  Normal  Mood:  Anxious  Affect:  Congruent  Thought Process:  Goal Directed and Descriptions of Associations: Circumstantial  Orientation:  Full (Time, Place, and Person)  Thought Content:  Paranoid Ideation and Rumination- seen as responding to internal stimuli - mumbling often when unobserved  Suicidal Thoughts:  No  Homicidal Thoughts:  No  Memory:  Immediate;   Fair Recent;   Fair Remote;   Fair  Judgement:  Impaired  Insight:  Lacking  Psychomotor Activity:  Normal  Concentration:  Concentration: Fair and Attention Span: Fair  Recall:  [employer] of Knowledge:  Fair  Language:  Fair  Akathisia:  No  Handed:  Right  AIMS (if indicated):     Assets:  Desire for Improvement Financial Resources/Insurance Physical Health Social Support  ADL's:  Intact  Cognition:  WNL  Sleep:  Number of Hours: 5.25     I agree with current treatment plan on 02/24/2016, Patient seen face-to-face for psychiatric evaluation follow-up, chart reviewed. Reviewed the information documented and agree with the treatment plan.  Treatment Plan Summary: Schizophrenia, paranoid, chronic with acute exacerbation (HCC) unstable Daily contact with patient to assess and evaluate symptoms and progress in treatment and Medication management   For Schizophrenia: Will continue cross titrating Geodon with Abilify. Continue Abilify to 10  mg po qhs.  For insomnia: Continue  Trazodone to 125 mg po qhs.  For anxiety/agitation: PRN medications as per Unit protocol.  Alcohol use disorder; Provided  counseling. CIWA/Ativan prn.  Will continue to monitor vitals ,medication compliance and treatment side effects while patient is here.   Will monitor for medical issues as well as call consult as needed.   Reviewed labs cbc - wbc - 17.2 - will repeat , K+ - 3.3 -  repeat CMP- wnl  , uds - POS FOR bzd - likely from ED, BAL - 145- ,lipid panel- wnl, tsh - wnl.  EKG for qtc - reviewed- wnl .  CSW will continue working on disposition.   Encourage patient to participate in therapeutic milieu .   Oneta Rack, NP 02/24/2016, 10:43 AM  Agree to notes and plan.

## 2016-02-25 MED ORDER — ARIPIPRAZOLE 10 MG PO TABS
20.0000 mg | ORAL_TABLET | Freq: Every day | ORAL | Status: DC
  Administered 2016-02-25 – 2016-02-26 (×2): 20 mg via ORAL
  Filled 2016-02-25 (×4): qty 2

## 2016-02-25 MED ORDER — TRAZODONE HCL 150 MG PO TABS
150.0000 mg | ORAL_TABLET | Freq: Every day | ORAL | Status: DC
  Administered 2016-02-25 – 2016-02-26 (×2): 150 mg via ORAL
  Filled 2016-02-25 (×4): qty 1

## 2016-02-25 NOTE — [facility] Group Notes (Signed)
[facility] LCSW Group Therapy  02/25/2016 , 3:03 PM   Type of Therapy:  Group Therapy  Participation Level:  Active  Participation Quality:  Attentive  Affect:  Appropriate  Cognitive:  Alert  Insight:  Improving  Engagement in Therapy:  Engaged  Modes of Intervention:  Discussion, Exploration and Socialization  Summary of Progress/Problems: Today's group focused on the term Diagnosis.  Participants were asked to define the term, and then pronounce whether it is a negative, positive or neutral term.  Invited.  Chose to not attend.  Angelica [name], Angelica [name] B 02/25/2016 , 3:03 PM

## 2016-02-25 NOTE — Progress Notes (Signed)
D:  Patient's self inventory sheet, patient sleeps good, no sleep medication given.  Good appetite, normal energy level, good concentration.  Denied depression, anxiety and hopeless.  Denied SI.  Denied physical problems.  Denied pain.  Goal is participating in group therapies regulating medications and being released to go home.  Plans to listen and participate.  Does have discharge plans. A:  Medications administered per MD orders.  Emotional support and encouragement given patient. R:  Denied SI and HI, contracts for safety.  Denied A/V hallucinations.  Safety maintained with 15 minute checks.

## 2016-02-25 NOTE — Progress Notes (Signed)
[facility]  02/25/2016 1:07 PM Angelica [name]  MRN:  [id number]  Subjective: Patient reports " I am fine , Is geodon an antipsychotic, is abilify for my depression, when are you going to send me home."   Objective: Angelica RosebushMaria Antoinette [name] is a [age] y old AAF , who presented very irritable and psychotic. Patient seen and chart reviewed.Discussed patient with treatment team. Pt today seen as lacking insight in to her illness , continues to state " I am fine". Per staff - pt seen as talking , having conversations to unknown person in her room . Pt also does this in the middle of the night - hence likely sleep also disrupted . However she denies sleep issues , wants to be discharged. Per pt her mother wants her to return home soon, however per RN - mother is concerned  That pt not doing well and wants her to be more stable prior to discharge. This was discussed with pt - pt does not want writer to call mom since she is at work at this time .     Principal Problem: Schizophrenia, paranoid, chronic with acute exacerbation (HCC)  Diagnosis:   Patient Active Problem List   Diagnosis Date Noted  . Schizophrenia, paranoid, chronic with acute exacerbation (HCC) [F20.0] 02/17/2016   Total Time spent with patient: 25 minutes  Past Psychiatric History: Please see H&P.  Past Medical History:  Past Medical History:  Diagnosis Date  . Medical history non-contributory   . Migraine   . No pertinent past medical history   . Paranoid schizophrenia [facility](HCC)     Past Surgical History:  Procedure Laterality Date  . CYST REMOVAL NECK    . CYST REMOVAL NECK  2005  . NO PAST SURGERIES     Family History:  Family History  Problem Relation Age of Onset  . Mental illness Neg Hx    Family Psychiatric  History: Please see H&P.  Social History:  History  Alcohol Use No     History  Drug Use No    Social History   Social History  . Marital status: Single    Spouse name: N/A  .  Number of children: N/A  . Years of education: N/A   Social History Main Topics  . Smoking status: Current Every Day Smoker    Packs/day: 0.25    Years: 5.00    Types: Cigarettes  . Smokeless tobacco: Never Used  . Alcohol use No  . Drug use: No  . Sexual activity: Yes    Birth control/ protection: Condom   Other Topics Concern  . None   Social History Narrative  . None   Additional Social History:    Pain Medications: see PTAmeds list Prescriptions: See MARs Over the Counter: see PTA meds list History of alcohol / drug use?: Yes Negative Consequences of Use: Personal relationships Withdrawal Symptoms: Other (Comment) (none)  Sleep: restless  Appetite:  Fair  Current Medications: Current Facility-Administered Medications  Medication Dose Route Frequency Provider Last Rate Last Dose  . acetaminophen (TYLENOL) tablet 650 mg  650 mg Oral Q6H PRN Laveda AbbeLaurie Britton Parks, NP   650 mg at 02/25/16 0806  . alum & mag hydroxide-simeth (MAALOX/MYLANTA) 200-200-20 MG/5ML suspension 30 mL  30 mL Oral Q4H PRN Laveda AbbeLaurie Britton Parks, NP   30 mL at 02/24/16 1622  . ARIPiprazole (ABILIFY) tablet 20 mg  20 mg Oral QHS Jomarie LongsSaramma Treydon Henricks, MD      . hydrOXYzine (ATARAX/VISTARIL) tablet  25 mg  25 mg Oral Q6H PRN Laveda Abbe, NP   25 mg at 02/24/16 1829  . ibuprofen (ADVIL,MOTRIN) tablet 600 mg  600 mg Oral Q6H PRN Kerry Hough, PA-C   600 mg at 02/23/16 1819  . LORazepam (ATIVAN) tablet 1 mg  1 mg Oral Q4H PRN Jomarie Longs, MD   1 mg at 02/19/16 2220  . magnesium hydroxide (MILK OF MAGNESIA) suspension 30 mL  30 mL Oral Daily PRN Laveda Abbe, NP      . OLANZapine zydis (ZYPREXA) disintegrating tablet 10 mg  10 mg Oral TID PRN Jomarie Longs, MD   10 mg at 02/24/16 0927   Or  . OLANZapine (ZYPREXA) injection 10 mg  10 mg Intramuscular TID PRN Jomarie Longs, MD      . traZODone (DESYREL) tablet 150 mg  150 mg Oral QHS Jomarie Longs, MD        Lab Results:  No results found  for this or any previous visit (from the past 48 hour(s)). Blood Alcohol level:  Lab Results  Component Value Date   ETH 145 (H) 02/16/2016   ETH <5 08/25/2014   Metabolic Disorder Labs: Lab Results  Component Value Date   HGBA1C 5.3 02/19/2016   MPG 105 02/19/2016   MPG 111 08/03/2013   Lab Results  Component Value Date   PROLACTIN 161.9 (H) 02/19/2016   PROLACTIN 112.4 08/03/2013   Lab Results  Component Value Date   CHOL 159 02/18/2016   TRIG 133 02/18/2016   HDL 33 (L) 02/18/2016   CHOLHDL 4.8 02/18/2016   VLDL 27 02/18/2016   LDLCALC 99 02/18/2016   LDLCALC 126 (H) 08/03/2013   Physical Findings: AIMS: Facial and Oral Movements Muscles of Facial Expression: None, normal Lips and Perioral Area: None, normal Jaw: None, normal Tongue: None, normal,Extremity Movements Upper (arms, wrists, hands, fingers): None, normal Lower (legs, knees, ankles, toes): None, normal, Trunk Movements Neck, shoulders, hips: None, normal, Overall Severity Severity of abnormal movements (highest score from questions above): None, normal Incapacitation due to abnormal movements: None, normal Patient's awareness of abnormal movements (rate only patient's report): No Awareness, Dental Status Current problems with teeth and/or dentures?: No Does patient usually wear dentures?: No  CIWA:  CIWA-Ar Total: 1 COWS:  COWS Total Score: 1  Musculoskeletal: Strength & Muscle Tone: within normal limits Gait & Station: normal Patient leans: N/A  Psychiatric Specialty Exam: Physical Exam  Nursing note and vitals reviewed.   Review of Systems  Psychiatric/Behavioral: Positive for hallucinations (seen as talking to self , having continuous conversations to unknown person in her room day and night) and substance abuse (Hx. alcohol use disorder). Negative for memory loss and suicidal ideas. The patient is nervous/anxious and has insomnia.   All other systems reviewed and are negative.   Blood  pressure (!) 102/57, pulse 85, temperature 98.5 F (36.9 C), temperature source Oral, resp. rate 18, height 5' 3.75" (1.619 m), weight 96.2 kg (212 lb), last menstrual period 02/15/2016.Body mass index is 36.68 kg/m.  General Appearance: Fairly Groomed   Eye Contact:  Fair  Speech:  Clear and Coherent  Volume:  Normal  Mood:  Anxious  Affect:  Labile  Thought Process:  Goal Directed and Descriptions of Associations: Circumstantial  Orientation:  Full (Time, Place, and Person)  Thought Content:  Paranoid Ideation and Rumination, hallucinations - auditory ?? - pt seen as having conversations in her room with unknown person day and night - is disrupting other  peers sleep at night  Suicidal Thoughts:  No  Homicidal Thoughts:  No  Memory:  Immediate;   Fair Recent;   Fair Remote;   Fair  Judgement:  Impaired  Insight:  Lacking  Psychomotor Activity:  Normal  Concentration:  Concentration: Fair and Attention Span: Fair  Recall:  [employer] of Knowledge:  Fair  Language:  Fair  Akathisia:  No  Handed:  Right  AIMS (if indicated):     Assets:  Desire for Improvement Financial Resources/Insurance Physical Health Social Support  ADL's:  Intact  Cognition:  WNL  Sleep:  Number of Hours: 5.25     Schizophrenia, paranoid, chronic with acute exacerbation (HCC) unstable   Will continue today 02/25/16  plan as below except where it is noted.   Treatment Plan Summary: Schizophrenia, paranoid, chronic with acute exacerbation (HCC) unstable Daily contact with patient to assess and evaluate symptoms and progress in treatment and Medication management   For Schizophrenia: Will continue cross titrating Geodon with Abilify. Increase Abilify to 20  mg po qhs. Discussed Abilify Maintena IM - pt is not interested at this time.  For insomnia: Will increase Trazodone to 150 mg po qhs.  For anxiety/agitation: PRN medications as per Unit protocol.  Alcohol use disorder; Provided  counseling. CIWA/Ativan prn.  Will continue to monitor vitals ,medication compliance and treatment side effects while patient is here.   Will monitor for medical issues as well as call consult as needed.   Reviewed labs cbc - wbc - 17.2 - will repeat , K+ - 3.3 -  repeat CMP- wnl  , uds - POS FOR bzd - likely from ED, BAL - 145- ,lipid panel- wnl, tsh - wnl.  EKG for qtc - reviewed- wnl .  CSW will continue working on disposition.   Encourage patient to participate in therapeutic milieu .   Ercell Razon, MD 02/25/2016, 1:07 PM

## 2016-02-25 NOTE — Progress Notes (Signed)
Writer spoke with patient 1:1 and she reports having had a good day. She continues to remain isolative to her room and have conversations with herself. She was compliant with her medications and reports that she is ready to go home. Support givne and safety maintained on unit with 15 min checks.

## 2016-02-25 NOTE — Progress Notes (Signed)
Recreation Therapy Notes  Date: 02/25/16 Time: 1000 Location: 500 Hall Dayroom  Group Topic: Coping Skills  Goal Area(s) Addresses:  Patients will be able to identify positive coping skills. Patients will be able to identify the benefits of coping skills. Patients will be able to identify how using coping skills will be helpful post d/c.  Behavioral Response: Engaged  Intervention: Dry erase marker, dry erase board, strips of paper with various coping skills   Activity: Coping Skills Pictionary.  Patients were to pick a strip of paper from a can.  Patients were to draw what was on the paper on the board.  The remaining patients were to guess what was being drawn.  The person that guesses the picture, would get the next turn.  Education: PharmacologistCoping Skills, Building control surveyorDischarge Planning.   Education Outcome: Acknowledges understanding/In group clarification offered/Needs additional education.   Clinical Observations/Feedback: Pt stated coping skills help you deal with things.  Pt was engaged in group.  Pt was talking to herself throughout the activity.    Caroll RancherMarjette Myah Guynes, LRT/CTRS     Caroll RancherLindsay, Theodora Lalanne A 02/25/2016 12:27 PM

## 2016-02-25 NOTE — Plan of Care (Signed)
Problem: Education: Goal: Ability to make informed decisions regarding treatment will improve Outcome: Progressing Nurse discussed depression/anxiety/coping skills with patient.    

## 2016-02-26 MED ORDER — ARIPIPRAZOLE ER 400 MG IM SRER
400.0000 mg | INTRAMUSCULAR | Status: DC
  Administered 2016-02-26: 400 mg via INTRAMUSCULAR

## 2016-02-26 MED ORDER — ARIPIPRAZOLE 5 MG PO TABS
5.0000 mg | ORAL_TABLET | Freq: Every day | ORAL | Status: DC
  Administered 2016-02-26 – 2016-02-27 (×2): 5 mg via ORAL
  Filled 2016-02-26 (×4): qty 1

## 2016-02-26 NOTE — [facility] Group Notes (Signed)
[facility] LCSW Group Therapy  02/26/2016 1:15 pm  Type of Therapy: Process Group Therapy  Participation Level:  Active  Participation Quality:  Appropriate  Affect:  Flat  Cognitive:  Oriented  Insight:  Improving  Engagement in Group:  Limited  Engagement in Therapy:  Limited  Modes of Intervention:  Activity, Clarification, Education, Problem-solving and Support  Summary of Progress/Problems: Today's group addressed the issue of overcoming obstacles.  Patients were asked to identify their biggest obstacle post d/c that stands in the way of their on-going success, and then problem solve as to how to manage this.  In and out several times.  While present, was generally talking to herself, but did keep her hand over her mouth so it was more mumbling than anything else.  Contribued minimally.  "My biggest obstacle is taking my medication, according to my mother."  Stated they have "put a plan in place" that will address this, and stated "there is nothing more to talk about."  Angelica [name], [name] B 02/26/2016   1:57 PM

## 2016-02-26 NOTE — Progress Notes (Signed)
Recreation Therapy Notes Animal-Assisted Activity (AAA) Program Checklist/Progress Notes Patient Eligibility Criteria Checklist & Daily Group note for Rec Tx Intervention  Date: 01.09.2018 Time: 2:45pm Location: 400 Hall Dayroom    AAA/T Program Assumption of Risk Form signed by Patient/ or Parent Legal Guardian No  Clinical Observations/Feedback: Patient discussed with MD for appropriateness in pet therapy session. Both LRT and MD agree patient is appropriate for participation. Patient offered participation in session, but politely declined participation.   Angelica [name], LRT/CTRS        Angelica [name] L 02/26/2016 3:17 PM 

## 2016-02-26 NOTE — Progress Notes (Signed)
[facility]  02/26/2016 2:17 PM Angelica [name]  MRN:  [id number]  Subjective: Patient reports " I am ok."    Objective: Angelica [name] is a 89 y old AAF , who presented very irritable and psychotic. Patient seen and chart reviewed.Discussed patient with treatment team. Pt states she is willing to take the abilify maintena IM. Pt denies new concerns. Per staff - pt often seen as talking to self , continues to lack insight in to her illness. Continue to need encouragement and support.      Principal Problem: Schizophrenia, paranoid, chronic with acute exacerbation (HCC)  Diagnosis:   Patient Active Problem List   Diagnosis Date Noted  . Schizophrenia, paranoid, chronic with acute exacerbation (HCC) [F20.0] 02/17/2016   Total Time spent with patient: 20 minutes  Past Psychiatric History: Please see H&P.  Past Medical History:  Past Medical History:  Diagnosis Date  . Medical history non-contributory   . Migraine   . No pertinent past medical history   . Paranoid schizophrenia [facility](HCC)     Past Surgical History:  Procedure Laterality Date  . CYST REMOVAL NECK    . CYST REMOVAL NECK  2005  . NO PAST SURGERIES     Family History:  Family History  Problem Relation Age of Onset  . Mental illness Neg Hx    Family Psychiatric  History: Please see H&P.  Social History:  History  Alcohol Use No     History  Drug Use No    Social History   Social History  . Marital status: Single    Spouse name: N/A  . Number of children: N/A  . Years of education: N/A   Social History Main Topics  . Smoking status: Current Every Day Smoker    Packs/day: 0.25    Years: 5.00    Types: Cigarettes  . Smokeless tobacco: Never Used  . Alcohol use No  . Drug use: No  . Sexual activity: Yes    Birth control/ protection: Condom   Other Topics Concern  . None   Social History Narrative  . None   Additional Social History:    Pain Medications: see  PTAmeds list Prescriptions: See MARs Over the Counter: see PTA meds list History of alcohol / drug use?: Yes Negative Consequences of Use: Personal relationships Withdrawal Symptoms: Other (Comment) (none)  Sleep: Fair  Appetite:  Fair  Current Medications: Current Facility-Administered Medications  Medication Dose Route Frequency Provider Last Rate Last Dose  . acetaminophen (TYLENOL) tablet 650 mg  650 mg Oral Q6H PRN Laveda AbbeLaurie Britton Parks, NP   650 mg at 02/25/16 1415  . alum & mag hydroxide-simeth (MAALOX/MYLANTA) 200-200-20 MG/5ML suspension 30 mL  30 mL Oral Q4H PRN Laveda AbbeLaurie Britton Parks, NP   30 mL at 02/24/16 1622  . ARIPiprazole (ABILIFY) tablet 20 mg  20 mg Oral QHS Jomarie LongsSaramma Macklin Jacquin, MD   20 mg at 02/25/16 2325  . ARIPiprazole (ABILIFY) tablet 5 mg  5 mg Oral QPC lunch Jomarie LongsSaramma Stephane Niemann, MD   5 mg at 02/26/16 1219  . ARIPiprazole ER SRER 400 mg  400 mg Intramuscular Q30 days Jomarie LongsSaramma Dylann Gallier, MD   400 mg at 02/26/16 1303  . hydrOXYzine (ATARAX/VISTARIL) tablet 25 mg  25 mg Oral Q6H PRN Laveda AbbeLaurie Britton Parks, NP   25 mg at 02/24/16 1829  . ibuprofen (ADVIL,MOTRIN) tablet 600 mg  600 mg Oral Q6H PRN Kerry HoughSpencer E Simon, PA-C   600 mg at 02/26/16 1219  .  LORazepam (ATIVAN) tablet 1 mg  1 mg Oral Q4H PRN Jomarie Longs, MD   1 mg at 02/19/16 2220  . magnesium hydroxide (MILK OF MAGNESIA) suspension 30 mL  30 mL Oral Daily PRN Laveda Abbe, NP      . OLANZapine zydis (ZYPREXA) disintegrating tablet 10 mg  10 mg Oral TID PRN Jomarie Longs, MD   10 mg at 02/25/16 1413   Or  . OLANZapine (ZYPREXA) injection 10 mg  10 mg Intramuscular TID PRN Jomarie Longs, MD      . traZODone (DESYREL) tablet 150 mg  150 mg Oral QHS Jomarie Longs, MD   150 mg at 02/25/16 2325    Lab Results:  No results found for this or any previous visit (from the past 48 hour(s)). Blood Alcohol level:  Lab Results  Component Value Date   ETH 145 (H) 02/16/2016   ETH <5 08/25/2014   Metabolic Disorder Labs: Lab  Results  Component Value Date   HGBA1C 5.3 02/19/2016   MPG 105 02/19/2016   MPG 111 08/03/2013   Lab Results  Component Value Date   PROLACTIN 161.9 (H) 02/19/2016   PROLACTIN 112.4 08/03/2013   Lab Results  Component Value Date   CHOL 159 02/18/2016   TRIG 133 02/18/2016   HDL 33 (L) 02/18/2016   CHOLHDL 4.8 02/18/2016   VLDL 27 02/18/2016   LDLCALC 99 02/18/2016   LDLCALC 126 (H) 08/03/2013   Physical Findings: AIMS: Facial and Oral Movements Muscles of Facial Expression: None, normal Lips and Perioral Area: None, normal Jaw: None, normal Tongue: None, normal,Extremity Movements Upper (arms, wrists, hands, fingers): None, normal Lower (legs, knees, ankles, toes): None, normal, Trunk Movements Neck, shoulders, hips: None, normal, Overall Severity Severity of abnormal movements (highest score from questions above): None, normal Incapacitation due to abnormal movements: None, normal Patient's awareness of abnormal movements (rate only patient's report): No Awareness, Dental Status Current problems with teeth and/or dentures?: No Does patient usually wear dentures?: No  CIWA:  CIWA-Ar Total: 0 COWS:  COWS Total Score: 2  Musculoskeletal: Strength & Muscle Tone: within normal limits Gait & Station: normal Patient leans: N/A  Psychiatric Specialty Exam: Physical Exam  Nursing note and vitals reviewed.   Review of Systems  Psychiatric/Behavioral: Positive for hallucinations (seen as talking to self , having continuous conversations to unknown person in her room day and night) and substance abuse (Hx. alcohol use disorder). The patient is nervous/anxious.   All other systems reviewed and are negative.   Blood pressure 120/72, pulse 78, temperature 99 F (37.2 C), resp. rate 18, height 5' 3.75" (1.619 m), weight 96.2 kg (212 lb), last menstrual period 02/15/2016.Body mass index is 36.68 kg/m.  General Appearance: Fairly Groomed   Eye Contact:  Fair  Speech:  Clear  and Coherent  Volume:  Normal  Mood:  Anxious  Affect:  Labile  Thought Process:  Goal Directed and Descriptions of Associations: Circumstantial  Orientation:  Full (Time, Place, and Person)  Thought Content:  Paranoid Ideation and Rumination, hallucinations - pt having conversations to self often   Suicidal Thoughts:  No  Homicidal Thoughts:  No  Memory:  Immediate;   Fair Recent;   Fair Remote;   Fair  Judgement:  Impaired  Insight:  Lacking  Psychomotor Activity:  Normal  Concentration:  Concentration: Fair and Attention Span: Fair  Recall:  [employer] of Knowledge:  Fair  Language:  Fair  Akathisia:  No  Handed:  Right  AIMS (if indicated):     Assets:  Desire for Improvement Financial Resources/Insurance Physical Health Social Support  ADL's:  Intact  Cognition:  WNL  Sleep:  Number of Hours: 7     Schizophrenia, paranoid, chronic with acute exacerbation (HCC) unstable   Will continue today 02/26/16  plan as below except where it is noted.   Treatment Plan Summary:Patient today continues to be seen as vaguely irritable, talking to self at times, eventhough she denies she has AH/VH.  Schizophrenia, paranoid, chronic with acute exacerbation (HCC) unstable Daily contact with patient to assess and evaluate symptoms and progress in treatment and Medication management   For Schizophrenia: Will Increase Abilify to 25  mg po daily in divided doses.Randol Kern Maintena IM 400 mg IM x first dose today 02/26/2016 - repeat q28 days.   For insomnia: Increased Trazodone to 150 mg po qhs.  For anxiety/agitation: PRN medications as per Unit protocol.  Alcohol use disorder; Provided counseling. CIWA/Ativan prn.  Will continue to monitor vitals ,medication compliance and treatment side effects while patient is here.   Will monitor for medical issues as well as call consult as needed.   Reviewed labs cbc - wbc - 17.2 - will repeat , K+ - 3.3 -  repeat CMP- wnl  , uds  - POS FOR bzd - likely from ED, BAL - 145- ,lipid panel- wnl, tsh - wnl.  EKG for qtc - reviewed- wnl .  CSW will continue working on disposition.   Encourage patient to participate in therapeutic milieu .   Avalynne Diver, MD 02/26/2016, 2:17 PM

## 2016-02-26 NOTE — Progress Notes (Addendum)
Recreation Therapy Notes  Date: 02/26/16 Time: 1000 Location: 500 Hall Dayroom  Group Topic: Self-Esteem  Goal Area(s) Addresses:  Patient will identify positive ways to increase self-esteem. Patient will verbalize benefit of increased self-esteem.  Behavioral Response: Engaged  Intervention: Blank masks, markers  Activity: How I see Me.  Patients were given a worksheet with a blank mask on it.  On the mask, patiens were to draw or write how they feel others see them.  On the back of the sheet, patients were to write or draw how they see themselves.  Education:  Self-Esteem, Building control surveyorDischarge Planning.   Education Outcome: Acknowledges education/In group clarification offered/Needs additional education  Clinical Observations/Feedback: Pt stated self-esteem "gives you good moral and confidence in yourself".  Pt expressed that others see her as confident, smart and strong.  Pt also expressed seeing herself as reliable, smart, strong and beautiful.     Caroll RancherMarjette Bentlie Withem, LRT/CTRS   Lillia AbedLindsay, Sari Cogan A 02/26/2016 11:40 AM

## 2016-02-26 NOTE — Progress Notes (Signed)
Patient has been noted to be talking to unseen other.  When patient is confronted about talking she states "I was just talking to myself"  Patient is also noted to be motioning, finger pointing, and laughing at unseen other.  Patient denies SI and HI.  Patient complains of anxiety and is easily agitated when confronted about talking to unseen other.   Assess patient for safety, offer medications as prescribes, engage in 1:1 staff talks.   Continue to monitor as prescribed.

## 2016-02-26 NOTE — Progress Notes (Signed)
Angelica [name]. Angelica [name] had been in room and in bed for much of the evening, limited interaction or participation in milieu. Angelica [name] appears to be responding to internal stimuli, however did receive all bedtime medications without incident and spoke about how she thinks that she may want to get an Ability shot and spoke about how she will talk about it to the doctor in the morning. Angelica [name] denied SI and did not verbalize any complaints of pain. A. Support and encouragement provided. R. Safety maintained, will continue to monitor.

## 2016-02-27 MED ORDER — ARIPIPRAZOLE ER 400 MG IM SRER: 400.0000 mg | INTRAMUSCULAR | 0 refills | Status: AC

## 2016-02-27 MED ORDER — TRAZODONE HCL 150 MG PO TABS: 150.0000 mg | ORAL_TABLET | Freq: Every day | ORAL | 0 refills | Status: DC

## 2016-02-27 MED ORDER — HYDROXYZINE HCL 25 MG PO TABS: 25.0000 mg | ORAL_TABLET | Freq: Four times a day (QID) | ORAL | 0 refills | Status: AC | PRN

## 2016-02-27 MED ORDER — ARIPIPRAZOLE 5 MG PO TABS: 5.0000 mg | ORAL_TABLET | Freq: Every day | ORAL | 0 refills | Status: DC

## 2016-02-27 MED ORDER — ARIPIPRAZOLE 20 MG PO TABS: 20.0000 mg | ORAL_TABLET | Freq: Every day | ORAL | 0 refills | Status: DC

## 2016-02-27 NOTE — Progress Notes (Signed)
Recreation Therapy Notes  Date: 02/27/16 Time: 1000 Location: 500 Hall Dayroom  Group Topic: Communication, Team Building, Problem Solving  Goal Area(s) Addresses:  Patient will effectively work with peer towards shared goal.  Patient will identify skills used to make activity successful.  Patient will identify how skills used during activity can be used to reach post d/c goals.   Behavioral Response: Observed  Intervention: STEM Activity  Activity: Landing Pad. In teams patients were given 12 plastic drinking straws and [name] length of masking tape. Using the materials provided patients were asked to build [name] landing pad to catch [name] golf ball dropped from approximately 6 feet in the air.   Education: Pharmacist, communityocial Skills, Discharge Planning   Education Outcome: Acknowledges education/In group clarification offered/Needs additional education.   Clinical Observations/Feedback:  Pt was in and out of group.  Pt mostly observed and gave little assistance during group.   Angelica [name], Angelica [name]     Angelica [name], Angelica [name] 02/27/2016 12:18 PM

## 2016-02-27 NOTE — Tx Team (Signed)
Interdisciplinary Treatment and Diagnostic Plan Update  02/27/2016 Time of Session: 10:22 AM  Angelica [name] MRN: [id number]  Principal Diagnosis: Schizophrenia, paranoid, chronic with acute exacerbation ([place])  Secondary Diagnoses: Principal Problem:   Schizophrenia, paranoid, chronic with acute exacerbation ([place])   Current Medications:  Current Facility-Administered Medications  Medication Dose Route Frequency Provider Last Rate Last Dose  . acetaminophen (TYLENOL) tablet 650 mg  650 mg Oral Q6H PRN Ethelene Hal, NP   650 mg at 02/27/16 0811  . alum & mag hydroxide-simeth (MAALOX/MYLANTA) 200-200-20 MG/5ML suspension 30 mL  30 mL Oral Q4H PRN Ethelene Hal, NP   30 mL at 02/24/16 1622  . ARIPiprazole (ABILIFY) tablet 20 mg  20 mg Oral QHS Ursula Alert, MD   20 mg at 02/26/16 2221  . ARIPiprazole (ABILIFY) tablet 5 mg  5 mg Oral QPC lunch Ursula Alert, MD   5 mg at 02/26/16 1219  . ARIPiprazole ER SRER 400 mg  400 mg Intramuscular Q30 days Ursula Alert, MD   400 mg at 02/26/16 1303  . hydrOXYzine (ATARAX/VISTARIL) tablet 25 mg  25 mg Oral Q6H PRN Ethelene Hal, NP   25 mg at 02/27/16 0811  . ibuprofen (ADVIL,MOTRIN) tablet 600 mg  600 mg Oral Q6H PRN Laverle Hobby, PA-C   600 mg at 02/26/16 1219  . LORazepam (ATIVAN) tablet 1 mg  1 mg Oral Q4H PRN Ursula Alert, MD   1 mg at 02/19/16 2220  . magnesium hydroxide (MILK OF MAGNESIA) suspension 30 mL  30 mL Oral Daily PRN Ethelene Hal, NP      . OLANZapine zydis (ZYPREXA) disintegrating tablet 10 mg  10 mg Oral TID PRN Ursula Alert, MD   10 mg at 02/26/16 1535   Or  . OLANZapine (ZYPREXA) injection 10 mg  10 mg Intramuscular TID PRN Ursula Alert, MD      . traZODone (DESYREL) tablet 150 mg  150 mg Oral QHS Ursula Alert, MD   150 mg at 02/26/16 2221    PTA Medications: Prescriptions Prior to Admission  Medication Sig Dispense Refill Last Dose  . Paliperidone Palmitate 156 MG/ML SUSP  Inject 1 mL (156 mg total) into the muscle every 30 (thirty) days. First dose of 234 mg received on 08/05/13 with next dose of 156 mg due in thirty days. (Patient not taking: Reported on 02/16/2016) 0.9 mL 3 Not Taking at Unknown time  . ziprasidone (GEODON) 60 MG capsule Take 60 mg by mouth 2 (two) times daily with a meal.       Treatment Modalities: Medication Management, Group therapy, Case management,  1 to 1 session with clinician, Psychoeducation, Recreational therapy.   Physician Treatment Plan for Primary Diagnosis: Schizophrenia, paranoid, chronic with acute exacerbation ([place]) Long Term Goal(s): Improvement in symptoms so as ready for discharge  Short Term Goals: Ability to verbalize feelings will improve   Medication Management: Evaluate patient's response, side effects, and tolerance of medication regimen.  Therapeutic Interventions: 1 to 1 sessions, Unit Group sessions and Medication administration.  Evaluation of Outcomes: Adequate for Discharge  Physician Treatment Plan for Secondary Diagnosis: Principal Problem:   Schizophrenia, paranoid, chronic with acute exacerbation ([place])   Long Term Goal(s): Improvement in symptoms so as ready for discharge  Short Term Goals:  Compliance with prescribed medications will improve  Medication Management: Evaluate patient's response, side effects, and tolerance of medication regimen.  Therapeutic Interventions: 1 to 1 sessions, Unit Group sessions and Medication administration.  Evaluation of  Outcomes: Adequate for Discharge   1/5: Patient today seen as irritable , continues to respond to internal stimuli often - as observed by staff.  Discussed  LAI - she refuses , but is willing to take Abilify PO. Will start cross tapering her Geodon with Abilify.  For Schizophrenia: Will start cross titrating Geodon with Abilify. Start Abilify 5 mg po qhs.  For insomnia: Start Trazodone 100 mg po qhs.   RN Treatment Plan for Primary  Diagnosis: Schizophrenia, paranoid, chronic with acute exacerbation ([place]) Long Term Goal(s): Knowledge of disease and therapeutic regimen to maintain health will improve  Short Term Goals: Ability to verbalize frustration and anger appropriately will improve and Compliance with prescribed medications will improve  Medication Management: RN will administer medications as ordered by provider, will assess and evaluate patient's response and provide education to patient for prescribed medication. RN will report any adverse and/or side effects to prescribing provider.  Therapeutic Interventions: 1 on 1 counseling sessions, Psychoeducation, Medication administration, Evaluate responses to treatment, Monitor vital signs and CBGs as ordered, Perform/monitor CIWA, COWS, AIMS and Fall Risk screenings as ordered, Perform wound care treatments as ordered.  Evaluation of Outcomes: Adequate for Discharge   Recreational Therapy Treatment Plan for Primary Diagnosis: Schizophrenia, paranoid, chronic with acute exacerbation ([place]) Long Term Goal(s):  LTG- Patient will participate in recreation therapy tx in at least 2 group sessions without prompting from LRT.  Short Term Goals: Pt will be able to identify at least 5 coping skills for admitting dx by conclusion of recreation therapy tx.  Treatment Modalities: Group and Pet Therapy  Therapeutic Interventions: Psychoeducation  Evaluation of Outcomes: Adequate for Discharge   LCSW Treatment Plan for Primary Diagnosis: Schizophrenia, paranoid, chronic with acute exacerbation ([place]) Long Term Goal(s): Safe transition to appropriate next level of care at discharge, Engage patient in therapeutic group addressing interpersonal concerns.  Short Term Goals: Engage patient in aftercare planning with referrals and resources  Therapeutic Interventions: Assess for all discharge needs, 1 to 1 time with Social worker, Explore available resources and support systems, Assess  for adequacy in community support network, Educate family and significant other(s) on suicide prevention, Complete Psychosocial Assessment, Interpersonal group therapy.  Evaluation of Outcomes: Met   Progress in Treatment: Attending groups: Intermittently Participating in groups: Minimally Taking medication as prescribed: Yes Toleration medication: Yes, no side effects reported at this time Family/Significant other contact made: Yes Patient understands diagnosis: No  Limited insight Discussing patient identified problems/goals with staff: Yes Medical problems stabilized or resolved: Yes Denies suicidal/homicidal ideation: Yes Issues/concerns per patient self-inventory: None Other: N/A  New problem(s) identified: None identified at this time.   New Short Term/Long Term Goal(s): None identified at this time.   Discharge Plan or Barriers: Return home, follow up outpt  Reason for Continuation of Hospitalization:    Estimated Length of Stay: D/C today   Pt willing to get Abilify injection prior to d/c  Attendees: Patient: 02/27/2016  10:22 AM  Physician: Ursula Alert, MD 02/27/2016  10:22 AM  Nursing: Hoy Register, RN 02/27/2016  10:22 AM  RN Care Manager: Lars Pinks, RN 02/27/2016  10:22 AM  Social Worker: Ripley Fraise 02/27/2016  10:22 AM  Recreational Therapist: Laretta Bolster  02/27/2016  10:22 AM  Other: Norberto Sorenson 02/27/2016  10:22 AM  Other:  02/27/2016  10:22 AM    Scribe for Treatment Team:  Roque Lias LCSW 02/27/2016 10:22 AM

## 2016-02-27 NOTE — Progress Notes (Signed)
Discharge Note:  Patient discharged home.  Denied SI and HI.  Denied A/V hallucinations.  Denied pain.  Suicide prevention information given and discussed with patient who stated she understood and had no questions.  Patient stated she received all her belongings, clothing, toiletries, misc items, prescriptions, etc.  Patient stated she [facility].  All required discharge information given to patient at discharge.

## 2016-02-27 NOTE — Progress Notes (Signed)
  [facility] Discharge [facility] :  Will you be returning to the same living situation after discharge:  Yes,  home At discharge, do you have transportation home?: Yes,  [facility] Do you have the ability to pay for your medications: Yes,  MCD  Release of information consent forms completed and in the chart;  Patient's signature needed at discharge.  Patient to Follow up at: Follow-up Information    [facility] Follow up.   Specialty:  Behavioral Health Why:  [name] the day of d/c.  Contact him at [phone] to find out when your hospital follow up appointment is.  You are due for your Abilify injection again on 2/8 Contact information: [address] [place] [address] [phone]           Next level of care provider has [facility] to [facility] Link:no  Safety Planning and Suicide Prevention discussed: Yes,  yes  Have you used any form of tobacco in the last 30 days? (Cigarettes, Smokeless Tobacco, Cigars, and/or Pipes): Yes  Has patient been referred to the Quitline?: Patient refused referral  Patient has been referred for addiction treatment: N/A  Angelica RogueRodney B Caedyn [name] 02/27/2016, 10:26 AM

## 2016-02-27 NOTE — [facility] Suicide Risk Assessment (Signed)
[facility]   Principal Problem: Schizophrenia, paranoid, chronic with acute exacerbation [facility](HCC) Discharge Diagnoses:  Patient Active Problem List   Diagnosis Date Noted  . Schizophrenia, paranoid, chronic with acute exacerbation (HCC) [F20.0] 02/17/2016    Total Time spent with patient: 30 minutes  Musculoskeletal: Strength & Muscle Tone: within normal limits Gait & Station: normal Patient leans: N/A  Psychiatric Specialty Exam: Review of Systems  Psychiatric/Behavioral: Negative for depression, hallucinations and suicidal ideas. The patient is not nervous/anxious.   All other systems reviewed and are negative.   Blood pressure (!) 112/53, pulse (!) 106, temperature 98.7 F (37.1 C), resp. rate 18, height 5' 3.75" (1.619 m), weight 96.2 kg (212 lb), last menstrual period 02/15/2016.Body mass index is 36.68 kg/m.  General Appearance: Casual  Eye Contact::  Fair  Speech:  Clear and Coherent409  Volume:  Normal  Mood:  Euthymic  Affect:  Appropriate  Thought Process:  Goal Directed and Descriptions of Associations: Circumstantial  Orientation:  Full (Time, Place, and Person)  Thought Content:  Logical  Suicidal Thoughts:  No  Homicidal Thoughts:  No  Memory:  Immediate;   Fair Recent;   Fair Remote;   Fair  Judgement:  Fair  Insight:  Fair  Psychomotor Activity:  Normal  Concentration:  Fair  Recall:  Fis[employer]Fair  Fund of Knowledge:Fair  Language: Fair  Akathisia:  No  Handed:  Right  AIMS (if indicated):   0  Assets:  Communication Skills Desire for Improvement  Sleep:  Number of Hours: 6  Cognition: WNL  ADL's:  Intact   Mental Status Per Nursing Assessment::   On Admission:     Demographic Factors:  NA  Loss Factors: NA  Historical Factors: Impulsivity  Risk Reduction Factors:   Positive social support and Positive therapeutic relationship  Continued Clinical Symptoms:  Previous Psychiatric Diagnoses and Treatments  Cognitive  Features That Contribute To Risk:  None    Suicide Risk:  Minimal: No identifiable suicidal ideation.  Patients presenting with no risk factors but with morbid ruminations; may be classified as minimal risk based on the severity of the depressive symptoms    Plan Of Care/Follow-up recommendations:  Activity:  no restrictions Diet:  regular Other:  none  Courtney Fenlon, MD 02/27/2016, 9:17 AM

## 2016-02-27 NOTE — Plan of Care (Signed)
Problem: [facility] Participation in Recreation Therapeutic Interventions Goal: STG-Patient will identify at least five coping skills for ** STG: Coping Skills - Patient will be able to identify at least 5 coping skills for attitude by conclusion of recreation therapy tx  Outcome: Completed/Met Date Met: 02/27/16 Pt was able to identify coping skills at the completion of coping skills recreation therapy sessions.  Victorino Sparrow, LRT/CTRS

## 2016-02-27 NOTE — Progress Notes (Signed)
D:  Patient's self inventory sheet, patient sleeps good, no sleep medication given.  Good appetite, normal energy level, good concentration.  Denied depression, hopeless and anxiety.  Denied withdrawals.  Denied SI.  Denied physical problems.  Goal is participate in group therapies, regulate medications and be discharged home.  Plans to listen and participate.  Does have discharge plans. A:  Medications administered per MD orders.  Emotional support and encouragement given patient. R:  Denied SI and HI, contracts for safety.  Denied A/V hallucinations.  Safety maintained with 15 minute checks.

## 2016-03-04 NOTE — Discharge Summary (Signed)
Physician Discharge Summary Note  Patient:  Angelica [name] is an 36 y.o., female MRN:  [id number] DOB:  1980-08-10 Patient phone:  [phone] (home)  Patient address:   [address] [place] [id number],  Total Time spent with patient: 30 minutes  Date of Admission:  02/17/2016 Date of Discharge: 02/27/2016  Reason for Admission:  Aggressive behavior   Principal Problem: Schizophrenia, paranoid, chronic with acute exacerbation [facility](HCC) Discharge Diagnoses: Patient Active Problem List   Diagnosis Date Noted  . Schizophrenia, paranoid, chronic with acute exacerbation (HCC) [F20.0] 02/17/2016    Past Psychiatric History: see HPI  Past Medical History:  Past Medical History:  Diagnosis Date  . Medical history non-contributory   . Migraine   . No pertinent past medical history   . Paranoid schizophrenia [facility](HCC)     Past Surgical History:  Procedure Laterality Date  . CYST REMOVAL NECK    . CYST REMOVAL NECK  2005  . NO PAST SURGERIES     Family History:  Family History  Problem Relation Age of Onset  . Mental illness Neg Hx    Family Psychiatric  History:  See HPI Social History:  History  Alcohol Use No     History  Drug Use No    Social History   Social History  . Marital status: Single    Spouse name: N/A  . Number of children: N/A  . Years of education: N/A   Social History Main Topics  . Smoking status: Current Every Day Smoker    Packs/day: 0.25    Years: 5.00    Types: Cigarettes  . Smokeless tobacco: Never Used  . Alcohol use No  . Drug use: No  . Sexual activity: Yes    Birth control/ protection: Condom   Other Topics Concern  . None   Social History Narrative  . None    Hospital Course:  Angelica KernMaria A Suggsis a  36 y.o.AAfemale, who is single, unemployed who has a hx of schizophrenia, [place] , presented IVC'ed for aggressive behavior.    Angelica [name] was admitted for Schizophrenia,  paranoid, chronic with acute exacerbation (HCC) and crisis management.  Patient was treated with medications with their indications listed below in detail under Medication List.  Medical problems were identified and treated as needed.  Home medications were restarted as appropriate.  Improvement was monitored by observation and Angelica [name] daily report of symptom reduction. Emotional and mental status was monitored by daily self inventory reports completed by Angelica RosebushMaria Antoinette [name] and clinical staff.  Patient reported continued improvement, denied any new concerns.  Patient had been compliant on medications and denied side effects.  Support and encouragement was provided.         Angelica [name] was evaluated by the treatment team for stability and plans for continued recovery upon discharge.  Patient was offered further treatment options upon discharge including Residential, Intensive Outpatient and Outpatient treatment. Patient will follow up with agency listed below for medication management and counseling.  Encouraged patient to maintain satisfactory support network and home environment.  Advised to adhere to medication compliance and outpatient treatment follow up.  Prescriptions provided.       Angelica [name] motivation was an integral factor for scheduling further treatment.  Employment, transportation, bed availability, health status, family support, and any pending legal issues were also considered during patient's hospital stay.  Upon completion of this admission the patient was both  mentally and medically stable for discharge denying suicidal/homicidal ideation, auditory/visual/tactile hallucinations, delusional thoughts and paranoia.      Physical Findings: AIMS: Facial and Oral Movements Muscles of Facial Expression: None, normal Lips and Perioral Area: None, normal Jaw: None, normal Tongue: None, normal,Extremity Movements Upper (arms, wrists, hands, fingers):  None, normal Lower (legs, knees, ankles, toes): None, normal, Trunk Movements Neck, shoulders, hips: None, normal, Overall Severity Severity of abnormal movements (highest score from questions above): None, normal Incapacitation due to abnormal movements: None, normal Patient's awareness of abnormal movements (rate only patient's report): No Awareness, Dental Status Current problems with teeth and/or dentures?: No Does patient usually wear dentures?: No  CIWA:  CIWA-Ar Total: 1 COWS:  COWS Total Score: 2  Musculoskeletal: Strength & Muscle Tone: within normal limits Gait & Station: normal Patient leans: N/A  Psychiatric Specialty Exam: Physical Exam  Nursing note and vitals reviewed.   ROS  Blood pressure (!) 139/52, pulse 84, temperature 98.7 F (37.1 C), resp. rate 18, height 5' 3.75" (1.619 m), weight 96.2 kg (212 lb), last menstrual period 02/15/2016.Body mass index is 36.68 kg/m.    Have you used any form of tobacco in the last 30 days? (Cigarettes, Smokeless Tobacco, Cigars, and/or Pipes): Yes  Has this patient used any form of tobacco in the last 30 days? (Cigarettes, Smokeless Tobacco, Cigars, and/or Pipes) Yes, NA  Blood Alcohol level:  Lab Results  Component Value Date   ETH 145 (H) 02/16/2016   ETH <5 08/25/2014    Metabolic Disorder Labs:  Lab Results  Component Value Date   HGBA1C 5.3 02/19/2016   MPG 105 02/19/2016   MPG 111 08/03/2013   Lab Results  Component Value Date   PROLACTIN 161.9 (H) 02/19/2016   PROLACTIN 112.4 08/03/2013   Lab Results  Component Value Date   CHOL 159 02/18/2016   TRIG 133 02/18/2016   HDL 33 (L) 02/18/2016   CHOLHDL 4.8 02/18/2016   VLDL 27 02/18/2016   LDLCALC 99 02/18/2016   LDLCALC 126 (H) 08/03/2013    See Psychiatric Specialty Exam and Suicide Risk Assessment completed by Attending Physician prior to discharge.  Discharge destination:  Home  Is patient on multiple antipsychotic therapies at discharge:  No    Has Patient had three or more failed trials of antipsychotic monotherapy by history:  No  Recommended Plan for Multiple Antipsychotic Therapies: NA   Allergies as of 02/27/2016      Reactions   Invega [paliperidone Er] Anaphylaxis   AKATHISIA      Medication List    STOP taking these medications   paliperidone 156 MG/ML Susp injection Commonly known as:  INVEGA SUSTENNA   ziprasidone 60 MG capsule Commonly known as:  GEODON     TAKE these medications     Indication  ARIPiprazole 20 MG tablet Commonly known as:  ABILIFY Take 1 tablet (20 mg total) by mouth at bedtime.  Indication:  mood stabilization   ARIPiprazole 5 MG tablet Commonly known as:  ABILIFY Take 1 tablet (5 mg total) by mouth daily after lunch.  Indication:  mood stabilization   ARIPiprazole ER 400 MG Srer Inject 400 mg into the muscle every 30 (thirty) days. Next dose 2/8 Start taking on:  03/27/2016  Indication:  Schizophrenia   hydrOXYzine 25 MG tablet Commonly known as:  ATARAX/VISTARIL Take 1 tablet (25 mg total) by mouth every 6 (six) hours as needed for anxiety.  Indication:  Anxiety Neurosis   traZODone 150 MG tablet Commonly  known as:  DESYREL Take 1 tablet (150 mg total) by mouth at bedtime.  Indication:  Trouble Sleeping      Follow-up Information    [facility] up.   Specialty:  Behavioral Health Why:  Porfirio Oar from [facility] will take you home the day of d/c.  Contact him at [phone] to find out when your hospital follow up appointment is.  You are due for your Abilify injection again on 2/8 Contact information: [address] [place] [address] [phone]          Follow-up recommendations:  Activity:  as tol Diet:  as tol  Comments:  1.  Take all your medications as prescribed.   2.  Report any adverse side effects to outpatient provider. 3.  Patient instructed to not use alcohol or illegal drugs while on prescription medicines. 4.  In the event of  worsening symptoms, instructed patient to call 911, the crisis hotline or go to nearest emergency room for evaluation of symptoms.  Signed: Lindwood Qua, NP [facility] 03/04/2016, 11:19 AM

## 2017-10-27 ENCOUNTER — Other Ambulatory Visit: Payer: Self-pay

## 2017-10-27 ENCOUNTER — Emergency Department (HOSPITAL_COMMUNITY)
Admission: EM | Admit: 2017-10-27 | Discharge: 2017-10-27 | Disposition: A | Discharge status: Home / Self Care | Payer: Medicaid Other | Attending: Emergency Medicine | Admitting: Emergency Medicine

## 2017-10-27 ENCOUNTER — Encounter (HOSPITAL_COMMUNITY): Payer: Self-pay | Admitting: *Deleted

## 2017-10-27 DIAGNOSIS — Z79899 Other long term (current) drug therapy: Secondary | ICD-10-CM | POA: Diagnosis not present

## 2017-10-27 DIAGNOSIS — W25XXXA Contact with sharp glass, initial encounter: Secondary | ICD-10-CM | POA: Diagnosis not present

## 2017-10-27 DIAGNOSIS — S61411A Laceration without foreign body of right hand, initial encounter: Secondary | ICD-10-CM | POA: Diagnosis not present

## 2017-10-27 DIAGNOSIS — Z23 Encounter for immunization: Secondary | ICD-10-CM | POA: Diagnosis not present

## 2017-10-27 DIAGNOSIS — F17210 Nicotine dependence, cigarettes, uncomplicated: Secondary | ICD-10-CM | POA: Insufficient documentation

## 2017-10-27 DIAGNOSIS — Y929 Unspecified place or not applicable: Secondary | ICD-10-CM | POA: Insufficient documentation

## 2017-10-27 DIAGNOSIS — Y999 Unspecified external cause status: Secondary | ICD-10-CM | POA: Diagnosis not present

## 2017-10-27 DIAGNOSIS — Y93G1 Activity, food preparation and clean up: Secondary | ICD-10-CM | POA: Insufficient documentation

## 2017-10-27 MED ORDER — TETANUS-DIPHTH-ACELL PERTUSSIS 5-2.5-18.5 LF-MCG/0.5 IM SUSP
0.5000 mL | Freq: Once | INTRAMUSCULAR | Status: AC
  Administered 2017-10-27: 0.5 mL via INTRAMUSCULAR
  Filled 2017-10-27: qty 0.5

## 2017-10-27 MED ORDER — LIDOCAINE HCL 2 % IJ SOLN
20.0000 mL | Freq: Once | INTRAMUSCULAR | Status: AC
  Administered 2017-10-27: 400 mg
  Filled 2017-10-27: qty 20

## 2017-10-27 NOTE — ED Notes (Signed)
Patient verbalizes understanding of discharge instructions. Opportunity for questioning and answers were provided. Armband removed by staff, pt discharged from ED ambulatory.   

## 2017-10-27 NOTE — Discharge Instructions (Addendum)
Please read attached information. If you experience any new or worsening signs or symptoms please return to the emergency room for evaluation. Please follow-up with your primary care provider or specialist as discussed.  °

## 2017-10-27 NOTE — ED Provider Notes (Signed)
[facility] EMERGENCY DEPARTMENT Provider Note   CSN: [id number] Arrival date & time: 10/27/17  1152   History   Chief Complaint Chief Complaint  Patient presents with  . Laceration    HPI Angelica [name] is a 37 y.o. female.  HPI   37 year old female presents today with laceration to right hand.  Patient notes she was doing dishes when a glass cut the ulnar aspect of her hand.  She denies any loss of distal sensation strength and motor function.  She notes she is right-handed.  Patient notes her last tetanus shot was approximately 12 years ago.   Past Medical History:  Diagnosis Date  . Medical history non-contributory   . Migraine   . No pertinent past medical history   . Paranoid schizophrenia [facility])     Patient Active Problem List   Diagnosis Date Noted  . Schizophrenia, paranoid, chronic with acute exacerbation (HCC) 02/17/2016    Past Surgical History:  Procedure Laterality Date  . CYST REMOVAL NECK    . CYST REMOVAL NECK  2005  . NO PAST SURGERIES       OB History    Gravida  2   Para  2   Term  2   Preterm      AB      Living  2     SAB      TAB      Ectopic      Multiple      Live Births  2            Home Medications    Prior to Admission medications   Medication Sig Start Date End Date Taking? Authorizing Provider  ARIPiprazole (ABILIFY) 20 MG tablet Take 1 tablet (20 mg total) by mouth at bedtime. 02/27/16   Adonis Brook, NP  ARIPiprazole (ABILIFY) 5 MG tablet Take 1 tablet (5 mg total) by mouth daily after lunch. 02/27/16   Adonis Brook, NP  ARIPiprazole ER 400 MG SRER Inject 400 mg into the muscle every 30 (thirty) days. Next dose 2/8 03/27/16   Adonis Brook, NP  hydrOXYzine (ATARAX/VISTARIL) 25 MG tablet Take 1 tablet (25 mg total) by mouth every 6 (six) hours as needed for anxiety. 02/27/16   Adonis Brook, NP  traZODone (DESYREL) 150 MG tablet Take 1 tablet (150 mg total) by mouth at  bedtime. 02/27/16   Adonis Brook, NP    Family History Family History  Problem Relation Age of Onset  . Mental illness Neg Hx     Social History Social History   Tobacco Use  . Smoking status: Current Every Day Smoker    Packs/day: 0.25    Years: 5.00    Pack years: 1.25    Types: Cigarettes  . Smokeless tobacco: Never Used  Substance Use Topics  . Alcohol use: No  . Drug use: No     Allergies   Invega [paliperidone er]   Review of Systems Review of Systems  All other systems reviewed and are negative.    Physical Exam Updated Vital Signs BP 118/71 (BP Location: Right Arm)   Pulse 81   Temp 98.5 F (36.9 C) (Oral)   Resp 16   SpO2 97%   Physical Exam  Constitutional: She is oriented to person, place, and time. She appears well-developed and well-nourished.  HENT:  Head: Normocephalic and atraumatic.  Eyes: Pupils are equal, round, and reactive to light. Conjunctivae are normal. Right eye exhibits no discharge. Left  eye exhibits no discharge. No scleral icterus.  Neck: Normal range of motion. No JVD present. No tracheal deviation present.  Pulmonary/Chest: Effort normal. No stridor.  Musculoskeletal:  3 cm L shaped laceration to right ulnar hand -no deep space involvement full active range of motion of the fingers sensation intact  Neurological: She is alert and oriented to person, place, and time. Coordination normal.  Psychiatric: She has a normal mood and affect. Her behavior is normal. Judgment and thought content normal.  Nursing note and vitals reviewed.    ED Treatments / Results  Labs (all labs ordered are listed, but only abnormal results are displayed) Labs Reviewed - No data to display  EKG None  Radiology No results found.  Procedures .Marland KitchenLaceration Repair Date/Time: 10/27/2017 3:55 PM Performed by: Eyvonne Mechanic, PA-C Authorized by: Eyvonne Mechanic, PA-C   Consent:    Consent obtained:  Verbal   Consent given by:  Patient    Risks discussed:  Infection and pain   Alternatives discussed:  No treatment Anesthesia (see MAR for exact dosages):    Anesthesia method:  Local infiltration   Local anesthetic:  Lidocaine 2% w/o epi Laceration details:    Location: Right ulnar hand.   Length (cm):  3 Repair type:    Repair type:  Simple Pre-procedure details:    Preparation:  Patient was prepped and draped in usual sterile fashion Exploration:    Wound exploration: wound explored through full range of motion and entire depth of wound probed and visualized     Wound extent: no fascia violation noted, no foreign bodies/material noted, no muscle damage noted, no tendon damage noted, no underlying fracture noted and no vascular damage noted     Contaminated: no   Treatment:    Area cleansed with:  Saline   Amount of cleaning:  Standard   Irrigation solution:  Sterile saline   Visualized foreign bodies/material removed: no   Skin repair:    Repair method:  Sutures   Suture size:  4-0   Suture material:  Prolene   Number of sutures:  5 Approximation:    Approximation:  Close Post-procedure details:    Dressing:  Antibiotic ointment   Patient tolerance of procedure:  Tolerated well, no immediate complications   (including critical care time)  Medications Ordered in ED Medications  lidocaine (XYLOCAINE) 2 % (with pres) injection 400 mg (400 mg Infiltration Given 10/27/17 1543)  Tdap (BOOSTRIX) injection 0.5 mL (0.5 mLs Intramuscular Given 10/27/17 1544)     Initial Impression / Assessment and Plan / ED Course  I have reviewed the triage vital signs and the nursing notes.  Pertinent labs & imaging results that were available during my care of the patient were reviewed by me and considered in my medical decision making (see chart for details).      Labs:   Imaging:  Consults:  Therapeutics: Lidocaine, Tdap  Discharge Meds:   Assessment/Plan: 79 YOF presents today with laceration to her right hand.  No  deep space involvement.  Wound was repaired without complication.  Wound care instructions given.  Patient elected to have removal stitches placed, she will return in 7 to 10 days for suture removal.  She is given strict return precautions, she verbalized understanding and agreement to today's plan had no further questions or concerns.   Final Clinical Impressions(s) / ED Diagnoses   Final diagnoses:  Laceration of right hand, foreign body presence unspecified, initial encounter    ED Discharge Orders  None       Eyvonne Mechanic, PA-C 10/27/17 1559    Tegeler, Canary Brim, MD 10/27/17 [phone]

## 2017-10-27 NOTE — ED Notes (Signed)
ED Provider at bedside. 

## 2017-10-27 NOTE — ED Triage Notes (Signed)
Pt in with laceration to her left hand, cut on broken glass while washing dishes, bleeding controlled, unknown tetanus

## 2021-04-22 ENCOUNTER — Emergency Department (HOSPITAL_COMMUNITY): Payer: Medicaid Other

## 2021-04-22 ENCOUNTER — Emergency Department (HOSPITAL_COMMUNITY)
Admission: EM | Admit: 2021-04-22 | Discharge: 2021-04-23 | Disposition: A | Discharge status: Home / Self Care | Payer: Medicaid Other | Attending: Emergency Medicine | Admitting: Emergency Medicine

## 2021-04-22 ENCOUNTER — Other Ambulatory Visit: Payer: Self-pay

## 2021-04-22 DIAGNOSIS — Z5321 Procedure and treatment not carried out due to patient leaving prior to being seen by health care provider: Secondary | ICD-10-CM | POA: Diagnosis not present

## 2021-04-22 DIAGNOSIS — R0789 Other chest pain: Secondary | ICD-10-CM | POA: Insufficient documentation

## 2021-04-22 DIAGNOSIS — R079 Chest pain, unspecified: Secondary | ICD-10-CM | POA: Diagnosis present

## 2021-04-22 LAB — BASIC METABOLIC PANEL
Anion gap: 10 (ref 5–15)
BUN: 16 mg/dL (ref 6–20)
CO2: 22 mmol/L (ref 22–32)
Calcium: 9.3 mg/dL (ref 8.9–10.3)
Chloride: 105 mmol/L (ref 98–111)
Creatinine, Ser: 0.82 mg/dL (ref 0.44–1.00)
GFR, Estimated: >60 mL/min (ref >60)
Glucose, Bld: 100 mg/dL — ABNORMAL HIGH (ref 70–99)
Potassium: 3.7 mmol/L (ref 3.5–5.1)
Sodium: 137 mmol/L (ref 135–145)

## 2021-04-22 LAB — CBC
HCT: 42.4 % (ref 36.0–46.0)
Hemoglobin: 14.3 g/dL (ref 12.0–15.0)
MCH: 29.9 pg (ref 26.0–34.0)
MCHC: 33.7 g/dL (ref 30.0–36.0)
MCV: 88.7 fL (ref 80.0–100.0)
Platelets: 261 10*3/uL (ref 150–400)
RBC: 4.78 MIL/uL (ref 3.87–5.11)
RDW: 14.8 % (ref 11.5–15.5)
WBC: 11.9 10*3/uL — ABNORMAL HIGH (ref 4.0–10.5)
nRBC: 0 % (ref 0.0–0.2)

## 2021-04-22 LAB — TROPONIN I (HIGH SENSITIVITY)
Troponin I (High Sensitivity): 4 ng/L (ref <18)
Troponin I (High Sensitivity): 5 ng/L (ref <18)

## 2021-04-22 LAB — I-STAT BETA HCG BLOOD, ED (MC, WL, AP ONLY): I-stat hCG, quantitative: <5 m[IU]/mL (ref <5)

## 2021-04-22 NOTE — ED Notes (Signed)
Patient stated that her chest pain is feeling tighter.  ?

## 2021-04-22 NOTE — ED Triage Notes (Addendum)
Pt c/o centralized, non-radiating, reproducible w inspiration CP, "like anxiety but different." 324 ASA improved from 8/10 -> 3/10 ? ?VSS, EKG unremarkable ?

## 2021-04-22 NOTE — ED Notes (Signed)
Pt walked up to sort NT and states her CP is getting worse. Pt then walks outside and is ambulatory without difficulty. Pt in no apparent distress. PA aware.  ?

## 2021-04-23 ENCOUNTER — Emergency Department (HOSPITAL_COMMUNITY): Payer: Medicaid Other

## 2021-04-23 ENCOUNTER — Encounter (HOSPITAL_COMMUNITY): Payer: Self-pay

## 2021-04-23 ENCOUNTER — Emergency Department (HOSPITAL_COMMUNITY)
Admission: EM | Admit: 2021-04-23 | Discharge: 2021-04-23 | Disposition: A | Discharge status: Home / Self Care | Payer: Medicaid Other | Source: Home / Self Care | Attending: Emergency Medicine | Admitting: Emergency Medicine

## 2021-04-23 ENCOUNTER — Other Ambulatory Visit: Payer: Self-pay

## 2021-04-23 DIAGNOSIS — R0789 Other chest pain: Secondary | ICD-10-CM

## 2021-04-23 DIAGNOSIS — R0602 Shortness of breath: Secondary | ICD-10-CM | POA: Insufficient documentation

## 2021-04-23 DIAGNOSIS — F17210 Nicotine dependence, cigarettes, uncomplicated: Secondary | ICD-10-CM | POA: Insufficient documentation

## 2021-04-23 DIAGNOSIS — D72829 Elevated white blood cell count, unspecified: Secondary | ICD-10-CM | POA: Insufficient documentation

## 2021-04-23 DIAGNOSIS — R791 Abnormal coagulation profile: Secondary | ICD-10-CM | POA: Insufficient documentation

## 2021-04-23 LAB — CBC
HCT: 40.8 % (ref 36.0–46.0)
Hemoglobin: 13.6 g/dL (ref 12.0–15.0)
MCH: 29.7 pg (ref 26.0–34.0)
MCHC: 33.3 g/dL (ref 30.0–36.0)
MCV: 89.1 fL (ref 80.0–100.0)
Platelets: 255 10*3/uL (ref 150–400)
RBC: 4.58 MIL/uL (ref 3.87–5.11)
RDW: 14.7 % (ref 11.5–15.5)
WBC: 11.2 10*3/uL — ABNORMAL HIGH (ref 4.0–10.5)
nRBC: 0 % (ref 0.0–0.2)

## 2021-04-23 LAB — BASIC METABOLIC PANEL
Anion gap: 8 (ref 5–15)
BUN: 19 mg/dL (ref 6–20)
CO2: 23 mmol/L (ref 22–32)
Calcium: 9 mg/dL (ref 8.9–10.3)
Chloride: 106 mmol/L (ref 98–111)
Creatinine, Ser: 0.86 mg/dL (ref 0.44–1.00)
GFR, Estimated: >60 mL/min (ref >60)
Glucose, Bld: 89 mg/dL (ref 70–99)
Potassium: 4 mmol/L (ref 3.5–5.1)
Sodium: 137 mmol/L (ref 135–145)

## 2021-04-23 LAB — TROPONIN I (HIGH SENSITIVITY)
Troponin I (High Sensitivity): 22 ng/L — ABNORMAL HIGH (ref <18)
Troponin I (High Sensitivity): 3 ng/L (ref <18)

## 2021-04-23 LAB — D-DIMER, QUANTITATIVE: D-Dimer, Quant: 0.58 ug/mL-FEU — ABNORMAL HIGH (ref 0.00–0.50)

## 2021-04-23 LAB — I-STAT BETA HCG BLOOD, ED (MC, WL, AP ONLY): I-stat hCG, quantitative: <5 m[IU]/mL (ref <5)

## 2021-04-23 MED ORDER — IOHEXOL 350 MG/ML SOLN
100.0000 mL | Freq: Once | INTRAVENOUS | Status: AC | PRN
  Administered 2021-04-23: 100 mL via INTRAVENOUS

## 2021-04-23 MED ORDER — NITROGLYCERIN 0.4 MG SL SUBL
0.4000 mg | SUBLINGUAL_TABLET | SUBLINGUAL | Status: DC | PRN
  Administered 2021-04-23: 0.4 mg via SUBLINGUAL
  Filled 2021-04-23: qty 1

## 2021-04-23 MED ORDER — ALUM & MAG HYDROXIDE-SIMETH 200-200-20 MG/5ML PO SUSP
30.0000 mL | Freq: Once | ORAL | Status: AC
  Administered 2021-04-23: 30 mL via ORAL
  Filled 2021-04-23: qty 30

## 2021-04-23 MED ORDER — LIDOCAINE VISCOUS HCL 2 % MT SOLN
15.0000 mL | Freq: Once | OROMUCOSAL | Status: AC
  Administered 2021-04-23: 15 mL via ORAL
  Filled 2021-04-23: qty 15

## 2021-04-23 NOTE — Discharge Instructions (Addendum)
Return to ED with any new symptoms such as worsening shortness of breath, worsening chest pain, coughing up blood, left arm numbness ?Please follow-up with cardiology.  I have referred you.  You will need to call the number attached to this packet and make an appointment ?Please follow-up with pulmonology.  I have referred you.  You will need to call the number attached to this packet and make an appointment ?Please review the attached informational guide concerning atypical chest pain/chest wall pain. ?

## 2021-04-23 NOTE — ED Triage Notes (Signed)
Patient c/o constant mid chest pain and SOB x 2 days. ?Patient denies N/V or diaphoresis. ?Patient was at [facility] ED and left prior to receiving test results/discharge due to wait times. ?

## 2021-04-23 NOTE — ED Notes (Signed)
Pt states she is leaving d/t wait time 

## 2021-04-23 NOTE — ED Provider Notes (Signed)
[facility]-EMERGENCY DEPT Provider Note   CSN: [id number] Arrival date & time: 04/23/21  1310     History  Chief Complaint  Patient presents with   Chest Pain    Angelica [name] is a 41 y.o. female with history of bipolar disorder, schizophrenia.  Patient presents to the ED for evaluation of chest pain.  Patient states the chest pain has been ongoing for the last 2 days, is located in the middle of her chest, the patient denies radiation.  Patient states that the chest pain is waxing and waning, described as a pressure.  Patient states the chest pain is not worsened with exertion.  Patient states chest pain is worsened when she takes deep breath or coughs.  Patient states that she is a half a pack a day smoker for the last 15 years.  Patient states that she went to [facility] ED last night for evaluation however left due to long wait time.  Patient is endorsing shortness of breath and chest pain.  The patient denies nausea, vomiting, diarrhea, fevers, leg swelling, cough, recent travel/operation, abdominal pain, lightheadedness, dizziness, weakness.  Patient denies history of blood clots.   Chest Pain Associated symptoms: shortness of breath   Associated symptoms: no abdominal pain, no dizziness, no fever, no headache, no nausea, no vomiting and no weakness       Home Medications Prior to Admission medications   Medication Sig Start Date End Date Taking? Authorizing Provider  ARIPiprazole (ABILIFY) 20 MG tablet Take 1 tablet (20 mg total) by mouth at bedtime. 02/27/16   Adonis Brook, NP  ARIPiprazole (ABILIFY) 5 MG tablet Take 1 tablet (5 mg total) by mouth daily after lunch. 02/27/16   Adonis Brook, NP  ARIPiprazole ER 400 MG SRER Inject 400 mg into the muscle every 30 (thirty) days. Next dose 2/8 03/27/16   Adonis Brook, NP  hydrOXYzine (ATARAX/VISTARIL) 25 MG tablet Take 1 tablet (25 mg total) by mouth every 6 (six) hours as needed for anxiety. 02/27/16    Adonis Brook, NP  traZODone (DESYREL) 150 MG tablet Take 1 tablet (150 mg total) by mouth at bedtime. 02/27/16   Adonis Brook, NP      Allergies    [place] er]    Review of Systems   Review of Systems  Constitutional:  Negative for chills and fever.  Respiratory:  Positive for shortness of breath.   Cardiovascular:  Positive for chest pain. Negative for leg swelling.  Gastrointestinal:  Negative for abdominal pain, diarrhea, nausea and vomiting.  Neurological:  Negative for dizziness, weakness and headaches.  All other systems reviewed and are negative.  Physical Exam Updated Vital Signs BP 116/72 (BP Location: Left Arm)    Pulse 80    Temp 98 F (36.7 C) (Oral)    Resp 20    Ht 5\' 3"  (1.6 m)    Wt 122.5 kg    LMP 03/27/2021 (Approximate)    SpO2 98%    BMI 47.83 kg/m  Physical Exam Vitals and nursing note reviewed.  Constitutional:      General: She is not in acute distress.    Appearance: She is not ill-appearing, toxic-appearing or diaphoretic.  HENT:     Head: Normocephalic and atraumatic.     Nose: Nose normal.     Mouth/Throat:     Mouth: Mucous membranes are moist.  Eyes:     Extraocular Movements: Extraocular movements intact.     Pupils: Pupils are equal, round,  and reactive to light.  Neck:     Vascular: No JVD.  Cardiovascular:     Rate and Rhythm: Normal rate and regular rhythm.  Pulmonary:     Effort: Pulmonary effort is normal.     Breath sounds: No decreased breath sounds, wheezing, rhonchi or rales.  Chest:     Chest wall: Tenderness present.     Comments: Chest pain reproducible on palpation Abdominal:     General: Bowel sounds are normal.     Palpations: Abdomen is soft. There is no mass.     Tenderness: There is no abdominal tenderness. There is no guarding or rebound.  Musculoskeletal:     Cervical back: Normal range of motion and neck supple.     Right lower leg: No edema.     Left lower leg: No edema.  Skin:    General:  Skin is warm and dry.     Capillary Refill: Capillary refill takes less than 2 seconds.  Neurological:     Mental Status: She is alert.    ED Results / Procedures / Treatments   Labs (all labs ordered are listed, but only abnormal results are displayed) Labs Reviewed  CBC - Abnormal; Notable for the following components:      Result Value   WBC 11.2 (*)    All other components within normal limits  D-DIMER, QUANTITATIVE - Abnormal; Notable for the following components:   D-Dimer, Quant 0.58 (*)    All other components within normal limits  TROPONIN I (HIGH SENSITIVITY) - Abnormal; Notable for the following components:   Troponin I (High Sensitivity) 22 (*)    All other components within normal limits  BASIC METABOLIC PANEL  I-STAT BETA HCG BLOOD, ED ([facility], [facility], [facility] ONLY)  TROPONIN I (HIGH SENSITIVITY)    EKG EKG Interpretation  Date/Time:  Tuesday April 23 2021 13:20:22 EST Ventricular Rate:  84 PR Interval:  145 QRS Duration: 88 QT Interval:  365 QTC Calculation: 432 R Axis:   88 Text Interpretation: Sinus rhythm Confirmed by Alvester Chou [phone]) on 04/23/2021 1:31:01 PM  Radiology DG Chest 2 View  Result Date: 04/23/2021 CLINICAL DATA:  Chest pain. EXAM: CHEST - 2 VIEW COMPARISON:  April 22, 2021. FINDINGS: The heart size and mediastinal contours are within normal limits. Both lungs are clear. The visualized skeletal structures are unremarkable. IMPRESSION: No active cardiopulmonary disease. Electronically Signed   By: Lupita Raider M.D.   On: 04/23/2021 13:48   DG Chest 2 View  Result Date: 04/22/2021 CLINICAL DATA:  Chest pain on inspiration, initial encounter EXAM: CHEST - 2 VIEW COMPARISON:  None. FINDINGS: The heart size and mediastinal contours are within normal limits. Both lungs are clear. The visualized skeletal structures are unremarkable. IMPRESSION: No acute abnormality noted. Electronically Signed   By: Alcide Clever M.D.   On: 04/22/2021 19:17   CT Angio Chest  PE W and/or Wo Contrast  Result Date: 04/23/2021 CLINICAL DATA:  Chest pain, shortness of breath. EXAM: CT ANGIOGRAPHY CHEST WITH CONTRAST TECHNIQUE: Multidetector CT imaging of the chest was performed using the standard protocol during bolus administration of intravenous contrast. Multiplanar CT image reconstructions and MIPs were obtained to evaluate the vascular anatomy. RADIATION DOSE REDUCTION: This exam was performed according to the departmental dose-optimization program which includes automated exposure control, adjustment of the mA and/or kV according to patient size and/or use of iterative reconstruction technique. CONTRAST:  OMNIPAQUE IOHEXOL 350 MG/ML SOLN COMPARISON:  None. FINDINGS: Cardiovascular: Satisfactory  opacification of the pulmonary arteries to the segmental level. No evidence of pulmonary embolism. Normal heart size. No pericardial effusion. Enlarged pulmonary artery is noted suggesting pulmonary artery hypertension. Mediastinum/Nodes: No enlarged mediastinal, hilar, or axillary lymph nodes. Thyroid gland, trachea, and esophagus demonstrate no significant findings. Lungs/Pleura: No pneumothorax or pleural effusion is noted. Mild mosaic pattern is noted throughout both lungs suggesting air trapping secondary to small airways disease or possibly mild multifocal inflammation. Upper Abdomen: No acute abnormality. Musculoskeletal: No chest wall abnormality. No acute or significant osseous findings. Review of the MIP images confirms the above findings. IMPRESSION: No definite evidence of pulmonary embolus. Enlarged pulmonary artery is noted suggesting pulmonary artery hypertension. Mild mosaic pattern is noted throughout both lungs suggesting air trapping secondary to small airways disease or possibly mild multifocal inflammation. Electronically Signed   By: Lupita RaiderJames  Green Jr M.D.   On: 04/23/2021 17:47    Procedures Procedures    Medications Ordered in ED Medications  nitroGLYCERIN  (NITROSTAT) SL tablet 0.4 mg (0.4 mg Sublingual Given 04/23/21 1640)  alum & mag hydroxide-simeth (MAALOX/MYLANTA) 200-200-20 MG/5ML suspension 30 mL (30 mLs Oral Given 04/23/21 1742)    And  lidocaine (XYLOCAINE) 2 % viscous mouth solution 15 mL (15 mLs Oral Given 04/23/21 1742)  iohexol (OMNIPAQUE) 350 MG/ML injection 100 mL (100 mLs Intravenous Contrast Given 04/23/21 1721)    ED Course/ Medical Decision Making/ A&P Clinical Course as of 04/23/21 1804  Tue Apr 23, 2021  1623 D-Dimer, Quant(!): 0.58 [CG]  1656 This is a [age] female with a history of obesity presenting to emergency department with chest pain, epigastric, that began approximately 2 days ago.  She suffers from chronic reflux and is not sure whether this is similar, the pain is reproducible with palpation of her chest.  She says she has never had this feeling before.  She has no personal or family history of DVT, PE, cardiac disease or MI.  She does smoke cigarettes.  She denies history of hypertension, diabetes, high cholesterol that she is aware of.  Blood test were notable for very mild leukocytosis white count of 11.2, troponin 3, EKG shows normal sinus rhythm without acute ischemic findings my interpretation.  D-dimer is elevated 0.58.  The patient was given a trial of nitroglycerin that did not improve or affect her pain at all.  I think is reasonable to try GI cocktail now as this may be reflux related.  Also discussed with her PE rule out with a CT scan, which she is agreeable to.  My overall clinical suspicion for ACS with normal EKG and flat troponins is fairly low.  I also have a lower clinical suspicion for aortic dissection, she appears quite comfortable on exam [MT]  1657 There is no widened mediastinum on chest x-ray [MT]    Clinical Course User Index [CG] Al DecantGroce, Arrietty Dercole F, PA-C [MT] Renaye Rakersrifan, Kermit BaloMatthew J, MD                           Medical Decision Making Amount and/or Complexity of Data Reviewed Labs: ordered. Radiology:  ordered.   41 year old female presents to ED for evaluation of chest pain.  Please see HPI for further details. On examination, the patient is afebrile, nontachycardic, nonhypoxic, clear lung sounds bilaterally, soft compressible abdomen in all 4 quadrants.  Patient nontoxic in appearance.  Patient chest pain is reproducible on palpation. Patient has no lower extremity swelling, JVD, negative Levine sign.  Patient worked  up utilizing following labs and imaging studies interpreted by me personally: - D-dimer elevated at 0.58 - CBC is elevated white blood cell count 11.2 however on chart review patient has chronically elevated white blood cell count - Patient BMP unremarkable - Patient troponin initially 22.  Delta troponin down to 3.  Patient seen last night at outside hospital with D-dimers recorded at 4 with delta troponin of 5.  Patient was provided with 0.4 mg sublingual nitroglycerin while here in the department today which did not alleviate her chest pain. -Patient chest x-ray does not show any signs of consolidation, effusion, mediastinal widening, cardiomegaly - CT angiogram does show enlarged pulmonary artery suggestive of pulmonary artery hypertension.  There is no signs of pulmonary embolus.  Patient provided with GI cocktail which he states alleviated her chest pain.  At this time, patient is stable for discharge.  Patient will be provided with cardiology and pulmonology follow-up due to enlarged pulmonary artery.  Patient has been given return precautions and she has voiced understanding.  The patient had all of her questions answered to her satisfaction.  Patient case discussed with my attending Dr. Renaye Rakers as well as attending Dr. Lynelle Doctor.  The patient is stable for discharge at this time.   Final Clinical Impression(s) / ED Diagnoses Final diagnoses:  Atypical chest pain    Rx / DC Orders ED Discharge Orders     None         Clent Ridges 04/23/21 1804     Terald Sleeper, MD 04/23/21 [phone]

## 2021-04-25 ENCOUNTER — Other Ambulatory Visit: Payer: Self-pay

## 2021-04-25 ENCOUNTER — Ambulatory Visit (INDEPENDENT_AMBULATORY_CARE_PROVIDER_SITE_OTHER): Payer: Medicaid Other | Admitting: Internal Medicine

## 2021-04-25 ENCOUNTER — Encounter: Payer: Self-pay | Admitting: Internal Medicine

## 2021-04-25 VITALS — BP 124/84 | HR 81 | Ht 63.0 in | Wt 273.8 lb

## 2021-04-25 DIAGNOSIS — R0789 Other chest pain: Secondary | ICD-10-CM

## 2021-04-25 NOTE — Progress Notes (Signed)
?Cardiology Office Note:   ? ?Date:  04/25/2021  ? ?ID:  Angelica [name], DOB December 09, 1980, MRN [id number] ? ?PCP:  System, Provider Not In ?  ?[facility] Providers ?Cardiologist:  None    ? ?Referring MD: Terald Sleeper, MD  ? ?No chief complaint on file. ?Atypical CP ? ?History of Present Illness:   ? ?Angelica [name] is a 40 y.o. female with a hx of migraine, referral from the ED for atypical cp ? ?Went to the ED 3/7 with chest pain. It's a soreness.  She described chest tightness. It lasted for a day or two. She notes that it is in the middle of her chest. Her chest pain feels a bit better now about 2-3. She notes some slight heavy lifting at home. Nothing makes it worse. Stretching it makes it a bit better.  Ecg showed sinus rhythm and no ischemic changes.  Trop elevation very mild. Repeat was negative. No hx DM2. No htn. No family hx of cardiac disease. She smokes 1 ppd.  ? ? ? ? ?Past Medical History:  ?Diagnosis Date  ? Medical history non-contributory   ? Migraine   ? No pertinent past medical history   ? Paranoid schizophrenia (HCC)   ? ? ?Past Surgical History:  ?Procedure Laterality Date  ? CYST REMOVAL NECK    ? CYST REMOVAL NECK  2005  ? NO PAST SURGERIES    ? ? ?Current Medications: ?No outpatient medications have been marked as taking for the 04/25/21 encounter (Appointment) with Maisie Fus, MD.  ?  ? ?Allergies:   Invega [paliperidone er]  ? ?Social History  ? ?Socioeconomic History  ? Marital status: Single  ?  Spouse name: Not on file  ? Number of children: Not on file  ? Years of education: Not on file  ? Highest education level: Not on file  ?Occupational History  ? Not on file  ?Tobacco Use  ? Smoking status: Every Day  ?  Packs/day: 0.25  ?  Years: 5.00  ?  Pack years: 1.25  ?  Types: Cigarettes  ? Smokeless tobacco: Never  ?Vaping Use  ? Vaping Use: Never used  ?Substance and Sexual Activity  ? Alcohol use: No  ? Drug use: No  ? Sexual activity: Yes  ?  Birth  control/protection: Condom  ?Other Topics Concern  ? Not on file  ?Social History Narrative  ? Not on file  ? ?Social Determinants of Health  ? ?Financial Resource Strain: Not on file  ?Food Insecurity: Not on file  ?Transportation Needs: Not on file  ?Physical Activity: Not on file  ?Stress: Not on file  ?Social Connections: Not on file  ?  ? ?Family History: ?The patient's family history is negative for Mental illness. ? ?ROS:   ?Please see the history of present illness.    ? All other systems reviewed and are negative. ? ?EKGs/Labs/Other Studies Reviewed:   ? ?The following studies were reviewed today: ? ? ?EKG:  EKG is  ordered today.  The ekg ordered today demonstrates  ? ?NSR , normal ekg ? ?Recent Labs: ?04/23/2021: BUN 19; Creatinine, Ser 0.86; Hemoglobin 13.6; Platelets 255; Potassium 4.0; Sodium 137  ?Recent Lipid Panel ?   ?Component Value Date/Time  ? CHOL 159 02/18/2016 0627  ? TRIG 133 02/18/2016 0627  ? HDL 33 (L) 02/18/2016 4742  ? CHOLHDL 4.8 02/18/2016 0627  ? VLDL 27 02/18/2016 0627  ? LDLCALC 99 02/18/2016 0627  ? ? ? ?  Risk Assessment/Calculations:   ?  ? ?    ? ?Physical Exam:   ? ?VS: ? ?Vitals:  ? 04/25/21 1101  ?BP: 124/84  ?Pulse: 81  ?SpO2: 97%  ? ? ? ?Wt Readings from Last 3 Encounters:  ?04/23/21 270 lb (122.5 kg)  ?01/18/12 189 lb 3.2 oz (85.8 kg)  ?01/17/12 190 lb (86.2 kg)  ?  ? ?GEN:  Well nourished, well developed in no acute distress ?HEENT: Normal ?NECK: No JVD ?LYMPHATICS: No lymphadenopathy ?CARDIAC: RRR, no murmurs, rubs, gallops ?RESPIRATORY:  Clear to auscultation without rales, wheezing or rhonchi  ?ABDOMEN: Soft, non-tender, non-distended ?MUSCULOSKELETAL: + point tenderness   ?SKIN: Warm and dry ?NEUROLOGIC:  Alert and oriented x 3 ?PSYCHIATRIC:  Normal affect  ? ?ASSESSMENT:   ? ?Atypical CP:  Pain associated with MSK symptoms. Can continue stretching and NSAIDs. This is atypical for coronary dx. Recommend to continue with lifestyle modification and CVD risk mitigation  (yearly A1c, lipid monitoring); otherwise she does not have an indication to continue to follow with cardiology at this time. ? ? ?PLAN:   ? ?In order of problems listed above: ? ?Follow up as needed ? ?   ? ?   ? ? ?Medication Adjustments/Labs and Tests Ordered: ?Current medicines are reviewed at length with the patient today.  Concerns regarding medicines are outlined above.  ?No orders of the defined types were placed in this encounter. ? ?No orders of the defined types were placed in this encounter. ? ? ?There are no Patient Instructions on file for this visit.  ? ?Signed, ?Maisie Fus, MD  ?04/25/2021 9:54 AM    ?[facility] ?

## 2021-04-25 NOTE — Patient Instructions (Signed)
Medication Instructions:  No Changes In Medications at this time.  *If you need a refill on your cardiac medications before your next appointment, please call your pharmacy*  Follow-Up: At [facility], you and your health needs are our priority.  As part of our continuing mission to provide you with exceptional heart care, we have created designated Provider Care Teams.  These Care Teams include your primary Cardiologist (physician) and Advanced Practice Providers (APPs -  Physician Assistants and Nurse Practitioners) who all work together to provide you with the care you need, when you need it.  We recommend signing up for the patient portal called "MyChart".  Sign up information is provided on this After Visit Summary.  MyChart is used to connect with patients for Virtual Visits (Telemedicine).  Patients are able to view lab/test results, encounter notes, upcoming appointments, etc.  Non-urgent messages can be sent to your provider as well.   To learn more about what you can do with MyChart, go to [email or web address].    Your next appointment:   AS NEEDED   The format for your next appointment:   In Person  Provider:   Branch, Mary E, MD   

## 2021-04-29 ENCOUNTER — Encounter: Payer: Self-pay | Admitting: Pulmonary Disease

## 2021-04-29 ENCOUNTER — Ambulatory Visit (INDEPENDENT_AMBULATORY_CARE_PROVIDER_SITE_OTHER): Payer: Medicaid Other | Admitting: Pulmonary Disease

## 2021-04-29 ENCOUNTER — Other Ambulatory Visit: Payer: Self-pay

## 2021-04-29 VITALS — BP 130/80 | HR 69 | Ht 63.0 in | Wt 271.2 lb

## 2021-04-29 DIAGNOSIS — R0602 Shortness of breath: Secondary | ICD-10-CM | POA: Diagnosis not present

## 2021-04-29 DIAGNOSIS — Z720 Tobacco use: Secondary | ICD-10-CM

## 2021-04-29 DIAGNOSIS — I272 Pulmonary hypertension, unspecified: Secondary | ICD-10-CM | POA: Diagnosis not present

## 2021-04-29 DIAGNOSIS — F17210 Nicotine dependence, cigarettes, uncomplicated: Secondary | ICD-10-CM | POA: Diagnosis not present

## 2021-04-29 MED ORDER — ALBUTEROL SULFATE HFA 108 (90 BASE) MCG/ACT IN AERS: 2.0000 | INHALATION_SPRAY | Freq: Four times a day (QID) | RESPIRATORY_TRACT | 2 refills | Status: DC | PRN

## 2021-04-29 NOTE — Patient Instructions (Signed)
Shortness of breath ?Chest tightness/atypical chest pain ?START Albuterol AS NEEDED for shortness of breath or wheezing ?ORDER pulmonary function tests ? ?Suspect pulmonary hypertension ?ORDER echocardiogram ?Hold on sleep study until above studies are obtained ? ?Tobacco abuse ?Patient is an active smoker. ?We discussed smoking cessation for 5 minutes. We discussed triggers and stressors and ways to deal with them. We discussed barriers to continued smoking and benefits of smoking cessation. Provided patient with information cessation techniques and interventions including [place] quitline. ? ?Follow-up with me in March, April, May. Patient preference. Please schedule PFTs prior to or same day of appointment ?

## 2021-04-29 NOTE — Progress Notes (Unsigned)
Subjective:   PATIENT ID: Angelica [name] GENDER: female DOB: Jul 05, 1980, MRN: [id number]   HPI  Chief Complaint  Patient presents with   Consult    Clots in lungs    Reason for Visit: New consult for shortness of breath  [name] is a 41 year old female active smoker with schizophrenia who presents as a referral for incidental enlarged pulmonary artery on CT.  She was recently seen in the ED on 04/23/2021 for chest pain/pressure not associated with exertion but exacerbated by it.  Associated with shortness of breath.  ED note reviewed.  Work-up included negative D-dimer, troponin and borderline leukocytosis.  She was treated with GI cocktail which improved her chest pain.  She was referred to cardiology and pulmonology.  She was seen by cardiology on 04/25/2021 for evaluation of atypical chest pain and advised for lifestyle modification and CVD risk medication.  She is an active smoker for 20 years. 1/2 ppd day. Does not vape. Shortness of breath with and without activity associated with activity. Some wheezing. Denies coughing. Has smoker's non-productive cough in the morning. Denies childhood illnesses.  She reports snoring at night. She napes during the week 2-3 times. Denies falling when working or talking to others. Will fall asleep when not engaged with anyone.   Social History: Current smoker  Environmental exposures: None  I have personally reviewed patient's past medical/family/social history, allergies, current medications.  Past Medical History:  Diagnosis Date   Medical history non-contributory    Migraine    No pertinent past medical history    Paranoid schizophrenia (HCC)      Family History  Problem Relation Age of Onset   Mental illness Neg Hx      Social History   Occupational History   Not on file  Tobacco Use   Smoking status: Every Day    Packs/day: 0.25    Years: 5.00    Pack years: 1.25    Types: Cigarettes   Smokeless tobacco:  Never   Tobacco comments:    04/29/21 - Hasn't smoked in 3 days  Vaping Use   Vaping Use: Never used  Substance and Sexual Activity   Alcohol use: No   Drug use: No   Sexual activity: Yes    Birth control/protection: Condom    Allergies  Allergen Reactions   Invega [Paliperidone Er] Anaphylaxis    AKATHISIA     Outpatient Medications Prior to Visit  Medication Sig Dispense Refill   ARIPiprazole ER 400 MG SRER Inject 400 mg into the muscle every 30 (thirty) days. Next dose 2/8 1 each 0   hydrOXYzine (ATARAX/VISTARIL) 25 MG tablet Take 1 tablet (25 mg total) by mouth every 6 (six) hours as needed for anxiety. (Patient not taking: Reported on 04/29/2021) 30 tablet 0   No facility-administered medications prior to visit.    Review of Systems  Constitutional:  Negative for chills, diaphoresis, fever, malaise/fatigue and weight loss.  HENT:  Negative for congestion.   Respiratory:  Positive for shortness of breath and wheezing. Negative for cough, hemoptysis and sputum production.   Cardiovascular:  Positive for chest pain. Negative for palpitations and leg swelling.    Objective:   Vitals:   04/29/21 1432  BP: 130/80  Pulse: 69  SpO2: 96%  Weight: 271 lb 3.2 oz (123 kg)  Height: 5\' 3"  (1.6 m)   SpO2: 96 % O2 Device: None (Room air)  Body mass index is 48.04 kg/m.   Physical Exam: General:  Well-appearing, no acute distress HENT: [place], AT Eyes: EOMI, no scleral icterus Respiratory: Clear to auscultation bilaterally.  No crackles, wheezing or rales Cardiovascular: RRR, -M/R/G, no JVD Extremities:-Edema,-tenderness Neuro: AAO x4, CNII-XII grossly intact Psych: Normal mood, normal affect  Data Reviewed:  Imaging: CTA 04/23/2021-no pulmonary embolus.  Enlarged pulmonary artery concerning for PAH.  Mild mosaic pattern suggesting air trapping  PFT: None on file  Labs: CBC    Component Value Date/Time   WBC 11.2 (H) 04/23/2021 1409   RBC 4.58 04/23/2021 1409    HGB 13.6 04/23/2021 1409   HCT 40.8 04/23/2021 1409   PLT 255 04/23/2021 1409   MCV 89.1 04/23/2021 1409   MCH 29.7 04/23/2021 1409   MCHC 33.3 04/23/2021 1409   RDW 14.7 04/23/2021 1409   LYMPHSABS 3.5 02/19/2016 0625   MONOABS 0.6 02/19/2016 0625   EOSABS 0.4 02/19/2016 0625   BASOSABS 0.0 02/19/2016 0625   Absolute eos 02/19/16 - 400     Assessment & Plan:   Discussion: 41 year old female active smoker with schizophrenia who presents as a referral for incidental enlarged pulmonary artery on CT. We discussed  Shortness of breath Chest tightness/atypical chest pain START Albuterol AS NEEDED for shortness of breath or wheezing ORDER pulmonary function tests  Suspect pulmonary hypertension ORDER echocardiogram Hold on sleep study until above studies are obtained  Tobacco abuse Patient is an active smoker. We discussed smoking cessation for 5 minutes. We discussed triggers and stressors and ways to deal with them. We discussed barriers to continued smoking and benefits of smoking cessation. Provided patient with information cessation techniques and interventions including [place] quitline.   Health Maintenance Immunization History  Administered Date(s) Administered   Influenza,inj,Quad PF,6+ Mos 02/03/2013   Pneumococcal Polysaccharide-23 02/03/2013   Tdap 10/27/2017   CT Lung Screen - not qualified due to age  No orders of the defined types were placed in this encounter. No orders of the defined types were placed in this encounter.   No follow-ups on file.  I have spent a total time of***-minutes on the day of the appointment reviewing prior documentation, coordinating care and discussing medical diagnosis and plan with the patient/family. Imaging, labs and tests included in this note have been reviewed and interpreted independently by me.  Previn Jian Mechele Collin, MD [facility] 04/29/2021 2:52 PM  Office Number [phone]

## 2021-05-01 ENCOUNTER — Encounter: Payer: Self-pay | Admitting: Pulmonary Disease

## 2021-05-21 ENCOUNTER — Other Ambulatory Visit: Payer: Self-pay | Admitting: Physician Assistant

## 2021-05-21 DIAGNOSIS — Z1231 Encounter for screening mammogram for malignant neoplasm of breast: Secondary | ICD-10-CM

## 2021-05-28 ENCOUNTER — Ambulatory Visit (HOSPITAL_COMMUNITY): Payer: Medicaid Other

## 2021-06-13 ENCOUNTER — Ambulatory Visit (HOSPITAL_COMMUNITY): Payer: Medicaid Other

## 2021-06-20 ENCOUNTER — Ambulatory Visit: Payer: Medicaid Other

## 2021-06-28 ENCOUNTER — Telehealth (HOSPITAL_COMMUNITY): Payer: Self-pay | Admitting: Pulmonary Disease

## 2021-06-28 ENCOUNTER — Ambulatory Visit (HOSPITAL_COMMUNITY): Payer: Medicaid Other | Attending: Cardiology

## 2021-06-28 ENCOUNTER — Encounter (HOSPITAL_COMMUNITY): Payer: Self-pay | Admitting: Pulmonary Disease

## 2021-06-28 ENCOUNTER — Encounter: Payer: Self-pay | Admitting: Cardiology

## 2021-06-28 NOTE — Progress Notes (Unsigned)
Patient ID: Angelica [name], female   DOB: 1980-07-24, 41 y.o.   MRN: [id number] ? ?Verified appointment "no show" status with Cathy at 1:16pm. ? ?

## 2021-06-28 NOTE — Telephone Encounter (Signed)
error 

## 2021-07-03 ENCOUNTER — Ambulatory Visit: Payer: Medicaid Other | Admitting: Pulmonary Disease

## 2021-07-22 ENCOUNTER — Ambulatory Visit (HOSPITAL_COMMUNITY): Payer: Medicaid Other

## 2021-08-09 ENCOUNTER — Encounter (HOSPITAL_COMMUNITY): Payer: Self-pay

## 2021-08-09 ENCOUNTER — Ambulatory Visit (HOSPITAL_COMMUNITY): Payer: Medicaid Other | Attending: Cardiology

## 2021-08-29 ENCOUNTER — Encounter: Payer: Self-pay | Admitting: Pulmonary Disease

## 2021-08-29 ENCOUNTER — Ambulatory Visit (INDEPENDENT_AMBULATORY_CARE_PROVIDER_SITE_OTHER): Payer: Medicaid Other | Admitting: Pulmonary Disease

## 2021-08-29 VITALS — BP 126/72 | HR 66 | Temp 98.0°F | Ht 64.0 in | Wt 276.2 lb

## 2021-08-29 DIAGNOSIS — R0602 Shortness of breath: Secondary | ICD-10-CM

## 2021-08-29 DIAGNOSIS — I272 Pulmonary hypertension, unspecified: Secondary | ICD-10-CM

## 2021-08-29 DIAGNOSIS — R0683 Snoring: Secondary | ICD-10-CM | POA: Insufficient documentation

## 2021-08-29 LAB — PULMONARY FUNCTION TEST
DL/VA % pred: 107 %
DL/VA: 4.77 ml/min/mmHg/L
DLCO unc % pred: 70 %
DLCO unc: 15.34 ml/min/mmHg
FEF 25-75 Post: 2.06 L/sec
FEF 25-75 Pre: 2.14 L/sec
FEF2575-%Change-Post: -3 %
FEF2575-%Pred-Post: 65 %
FEF2575-%Pred-Pre: 68 %
FEV1-%Change-Post: -5 %
FEV1-%Pred-Post: 61 %
FEV1-%Pred-Pre: 64 %
FEV1-Post: 1.85 L
FEV1-Pre: 1.95 L
FEV1FVC-%Change-Post: 6 %
FEV1FVC-%Pred-Pre: 103 %
FEV6-%Change-Post: -11 %
FEV6-%Pred-Post: 55 %
FEV6-%Pred-Pre: 62 %
FEV6-Post: 2.01 L
FEV6-Pre: 2.28 L
FEV6FVC-%Change-Post: 0 %
FEV6FVC-%Pred-Post: 101 %
FEV6FVC-%Pred-Pre: 101 %
FVC-%Change-Post: -10 %
FVC-%Pred-Post: 55 %
FVC-%Pred-Pre: 61 %
FVC-Post: 2.04 L
FVC-Pre: 2.29 L
Post FEV1/FVC ratio: 91 %
Post FEV6/FVC ratio: 100 %
Pre FEV1/FVC ratio: 85 %
Pre FEV6/FVC Ratio: 100 %
RV % pred: 132 %
RV: 2.1 L
TLC % pred: 85 %
TLC: 4.31 L

## 2021-08-29 MED ORDER — FLUTICASONE FUROATE-VILANTEROL 100-25 MCG/ACT IN AEPB: 1.0000 | INHALATION_SPRAY | Freq: Every day | RESPIRATORY_TRACT | 2 refills | Status: DC

## 2021-08-29 NOTE — Patient Instructions (Signed)
Shortness of breath Chest tightness/atypical chest pain START Breo 100-25 ONE puff ONCE a day. This is EVERYDAY inhaler CONTINUE Albuterol AS NEEDED for shortness of breath or wheezing. This is your RESCUE inhaler  Suspect pulmonary hypertension Intermediate risk for OSA (STOP BANG 3 - BMI >35, snoring, neck circumference) RE-ORDER echocardiogram ORDER home sleep study  Follow-up with me in 2 months

## 2021-08-29 NOTE — Progress Notes (Signed)
Subjective:   PATIENT ID: Angelica [name] GENDER: female DOB: [birth year], MRN: [id number]   HPI  Chief Complaint  Patient presents with   Follow-up    Breathing about the same. SOB with activity, daily tasks. Albuterol helps.    Reason for Visit: Follow-up shortness of breath  Angelica [name] is a 41 year old female active smoker with schizophrenia who presents for follow-up.  Initial consult She was recently seen in the ED on 04/23/2021 for chest pain/pressure not associated with exertion but exacerbated by it.  Associated with shortness of breath.  ED note reviewed.  Work-up included negative D-dimer, troponin and borderline leukocytosis.  She was treated with GI cocktail which improved her chest pain.  She was referred to cardiology and pulmonology.  She was seen by Cardiology on 04/25/2021 for evaluation of atypical chest pain and advised for lifestyle modification and CVD risk medication.  She is an active smoker for 20 years. 1/2 ppd day. Does not vape. Shortness of breath with and without activity associated with activity. Some wheezing. Denies coughing. Has smoker's non-productive cough in the morning. Denies childhood illnesses.  She reports snoring at night. She naps during the week 2-3 times. Denies falling when working or talking to others. Will fall asleep when not engaged with anyone.   08/29/21 Denies shortness of breath, coughing or wheezing. She continues to have chest tightness that improves with albuterol. Seems to occur activity including house chores. Quit smoking 1 month ago. She reports sleeping 6-8 hours nightly. Does snore but no one available to witness apnea. Wakes up with morning headaches. Denies excessive daytime sleepiness.  Social History: Former smoker. Quit 07/2021  Past Medical History:  Diagnosis Date   Medical history non-contributory    Migraine    No pertinent past medical history    Paranoid schizophrenia (HCC)      Family History   Problem Relation Age of Onset   Mental illness Neg Hx      Social History   Occupational History   Not on file  Tobacco Use   Smoking status: Every Day    Packs/day: 0.25    Years: 5.00    Total pack years: 1.25    Types: Cigarettes   Smokeless tobacco: Never   Tobacco comments:    04/29/21 - Hasn't smoked in 3 days  Vaping Use   Vaping Use: Never used  Substance and Sexual Activity   Alcohol use: No   Drug use: No   Sexual activity: Yes    Birth control/protection: Condom    Allergies  Allergen Reactions   Invega [Paliperidone Er] Anaphylaxis    AKATHISIA     Outpatient Medications Prior to Visit  Medication Sig Dispense Refill   albuterol (VENTOLIN HFA) 108 (90 Base) MCG/ACT inhaler Inhale 2 puffs into the lungs every 6 (six) hours as needed for wheezing or shortness of breath. 8 g 2   ARIPiprazole ER 400 MG SRER Inject 400 mg into the muscle every 30 (thirty) days. Next dose 2/8 1 each 0   hydrOXYzine (ATARAX/VISTARIL) 25 MG tablet Take 1 tablet (25 mg total) by mouth every 6 (six) hours as needed for anxiety. 30 tablet 0   No facility-administered medications prior to visit.    Review of Systems  Constitutional:  Negative for chills, diaphoresis, fever, malaise/fatigue and weight loss.  HENT:  Negative for congestion.   Respiratory:  Positive for shortness of breath and wheezing. Negative for cough, hemoptysis and sputum production.   Cardiovascular:  Positive for chest pain. Negative for palpitations and leg swelling.     Objective:   Vitals:   08/29/21 1325  BP: 126/72  Pulse: 66  Temp: 98 F (36.7 C)  TempSrc: Oral  SpO2: 99%  Weight: 276 lb 3.2 oz (125.3 kg)  Height: 5\' 4"  (1.626 m)   SpO2: 99 % (RA)  Body mass index is 47.41 kg/m.  Physical Exam: General: Well-appearing, no acute distress HENT: [place], AT Eyes: EOMI, no scleral icterus Respiratory: Clear to auscultation bilaterally.  No crackles, wheezing or rales Cardiovascular: RRR,  -M/R/G, no JVD Extremities:-Edema,-tenderness Neuro: AAO x4, CNII-XII grossly intact Psych: Normal mood, normal affect  Data Reviewed:  Imaging: CTA 04/23/2021-no pulmonary embolus.  Enlarged pulmonary artery concerning for PAH.  Mild mosaic pattern suggesting air trapping  PFT: 08/29/21 FVC 2.04 (55%) FEV1 1.85 (61%) Ratio 85  TLC 85% DLCO 70% Interpretation: No obstructive defect is present. Reduced FVC and FEV1 however normal lung volumes. Mildly reduced DLCO.   Labs: CBC    Component Value Date/Time   WBC 11.2 (H) 04/23/2021 1409   RBC 4.58 04/23/2021 1409   HGB 13.6 04/23/2021 1409   HCT 40.8 04/23/2021 1409   PLT 255 04/23/2021 1409   MCV 89.1 04/23/2021 1409   MCH 29.7 04/23/2021 1409   MCHC 33.3 04/23/2021 1409   RDW 14.7 04/23/2021 1409   LYMPHSABS 3.5 02/19/2016 0625   MONOABS 0.6 02/19/2016 0625   EOSABS 0.4 02/19/2016 0625   BASOSABS 0.0 02/19/2016 0625   Absolute eos 02/19/16 - 400     Assessment & Plan:   Discussion: 41 year old female active smoker with schizophrenia who presents for follow-up for shortness of breath. Prior CT imaging with enlarged pulmonary artery. We had discussed possible causes including undiagnosed/untreated OSA and/or chronic lung disease in setting of known tobacco abuse. Discussed management for possible asthma including bronchodilators. Concerned for secondary pulmonary hypertension due to suspected OSA. Work-up as below.  Suspected asthma Shortness of breath Chest tightness/atypical chest pain START Breo 100-25 ONE puff ONCE a day. This is EVERYDAY inhaler CONTINUE Albuterol AS NEEDED for shortness of breath or wheezing. This is your RESCUE inhaler  Suspect pulmonary hypertension Intermediate risk for OSA (STOP BANG 3 - BMI >35, snoring, neck circumference) RE-ORDER echocardiogram ORDER home sleep study  Tobacco abuse Quit 07/2021  Health Maintenance Immunization History  Administered Date(s) Administered    Influenza,inj,Quad PF,6+ Mos 02/03/2013   Pneumococcal Polysaccharide-23 02/03/2013   Tdap 10/27/2017   CT Lung Screen - not qualified due to age  Orders Placed This Encounter  Procedures   ECHOCARDIOGRAM COMPLETE    Standing Status:   Future    Standing Expiration Date:   08/30/2022    Order Specific Question:   Where should this test be performed    Answer:   [facility])    Order Specific Question:   Does the patient weigh less than or greater than 250 lbs?    Answer:   Patient weighs greater than 250 lbs    Order Specific Question:   Perflutren DEFINITY (image enhancing agent) should be administered unless hypersensitivity or allergy exist    Answer:   Administer Perflutren    Order Specific Question:   Reason for exam-Echo    Answer:   Pulmonary hypertension I27.2   Home sleep test    Standing Status:   Future    Standing Expiration Date:   08/30/2022    Order Specific Question:   Where should  this test be performed:    Answer:   [facility] - Pulmonary   Meds ordered this encounter  Medications   fluticasone furoate-vilanterol (BREO ELLIPTA) 100-25 MCG/ACT AEPB    Sig: Inhale 1 puff into the lungs daily.    Dispense:  60 each    Refill:  2    Return in about 2 months (around 10/30/2021).  I have spent a total time of 31-minutes on the day of the appointment including chart review, data review, collecting history, coordinating care and discussing medical diagnosis and plan with the patient/family. Past medical history, allergies, medications were reviewed. Pertinent imaging, labs and tests included in this note have been reviewed and interpreted independently by me.  Barba Solt Mechele Collin, MD [facility] 08/29/2021 1:37 PM  Office Number [phone]

## 2021-08-29 NOTE — Progress Notes (Signed)
Full PFT Performed Today  

## 2021-08-29 NOTE — Patient Instructions (Signed)
Full PFT Performed Today  

## 2021-08-30 ENCOUNTER — Other Ambulatory Visit: Payer: Self-pay | Admitting: *Deleted

## 2021-08-30 DIAGNOSIS — R0683 Snoring: Secondary | ICD-10-CM

## 2021-09-27 ENCOUNTER — Ambulatory Visit (HOSPITAL_BASED_OUTPATIENT_CLINIC_OR_DEPARTMENT_OTHER): Payer: Medicaid Other | Attending: Pulmonary Disease | Admitting: Pulmonary Disease

## 2021-10-08 ENCOUNTER — Ambulatory Visit (HOSPITAL_COMMUNITY): Payer: Medicaid Other | Attending: Pulmonary Disease

## 2021-10-29 ENCOUNTER — Other Ambulatory Visit (HOSPITAL_COMMUNITY): Payer: Medicaid Other

## 2021-10-29 ENCOUNTER — Encounter (HOSPITAL_COMMUNITY): Payer: Self-pay | Admitting: Pulmonary Disease

## 2021-11-07 ENCOUNTER — Ambulatory Visit: Payer: Medicaid Other | Admitting: Pulmonary Disease

## 2021-11-08 ENCOUNTER — Ambulatory Visit: Payer: Medicaid Other | Admitting: Pulmonary Disease

## 2022-05-28 ENCOUNTER — Encounter (INDEPENDENT_AMBULATORY_CARE_PROVIDER_SITE_OTHER): Payer: Medicaid Other | Admitting: Family Medicine

## 2022-06-09 ENCOUNTER — Emergency Department (HOSPITAL_COMMUNITY): Payer: Medicaid Other

## 2022-06-09 ENCOUNTER — Other Ambulatory Visit: Payer: Self-pay

## 2022-06-09 ENCOUNTER — Emergency Department (HOSPITAL_COMMUNITY)
Admission: EM | Admit: 2022-06-09 | Discharge: 2022-06-09 | Disposition: A | Discharge status: Home / Self Care | Payer: Medicaid Other | Attending: Emergency Medicine | Admitting: Emergency Medicine

## 2022-06-09 DIAGNOSIS — X58XXXA Exposure to other specified factors, initial encounter: Secondary | ICD-10-CM | POA: Insufficient documentation

## 2022-06-09 DIAGNOSIS — M25512 Pain in left shoulder: Secondary | ICD-10-CM | POA: Diagnosis present

## 2022-06-09 DIAGNOSIS — Y9384 Activity, sleeping: Secondary | ICD-10-CM | POA: Diagnosis not present

## 2022-06-09 DIAGNOSIS — S43005A Unspecified dislocation of left shoulder joint, initial encounter: Secondary | ICD-10-CM | POA: Diagnosis not present

## 2022-06-09 DIAGNOSIS — G43809 Other migraine, not intractable, without status migrainosus: Secondary | ICD-10-CM

## 2022-06-09 MED ORDER — DEXAMETHASONE SODIUM PHOSPHATE 10 MG/ML IJ SOLN
6.0000 mg | Freq: Once | INTRAMUSCULAR | Status: AC
  Administered 2022-06-09: 6 mg via INTRAVENOUS
  Filled 2022-06-09: qty 1

## 2022-06-09 MED ORDER — SODIUM CHLORIDE 0.9 % IV BOLUS
1000.0000 mL | Freq: Once | INTRAVENOUS | Status: AC
  Administered 2022-06-09: 1000 mL via INTRAVENOUS

## 2022-06-09 MED ORDER — METOCLOPRAMIDE HCL 5 MG/ML IJ SOLN
10.0000 mg | Freq: Once | INTRAMUSCULAR | Status: AC
  Administered 2022-06-09: 10 mg via INTRAVENOUS
  Filled 2022-06-09: qty 2

## 2022-06-09 MED ORDER — KETOROLAC TROMETHAMINE 30 MG/ML IJ SOLN
30.0000 mg | Freq: Once | INTRAMUSCULAR | Status: AC
  Administered 2022-06-09: 30 mg via INTRAVENOUS
  Filled 2022-06-09: qty 1

## 2022-06-09 NOTE — Discharge Instructions (Addendum)
Please call and follow-up with the neurologist who is managing your headaches.

## 2022-06-09 NOTE — ED Notes (Signed)
Pt to CT via WC

## 2022-06-09 NOTE — ED Triage Notes (Signed)
Pt arrived via POV. C/o severe HA for 2 weeks. Pt has chronic headaches and is under the care of a neurologist for them. Pt states the gabapentin is no longer working.   Pt AOx4

## 2022-06-09 NOTE — ED Provider Notes (Signed)
[place] EMERGENCY DEPARTMENT AT [facility] Provider Note   CSN: [id number] Arrival date & time: 06/09/22  1111     History  Chief Complaint  Patient presents with   Headache    Cheryel Kyte is a 42 y.o. female reports a history of near daily headaches presenting to ED with worsening of her headaches.  He says she has had about a week of persistent frontal and left temporal headache, that waxes and wanes but never goes away, does respond to ibuprofen and gabapentin but then comes back.  She denies any trauma or head injury.  She denies any persistent blurred vision, neck stiffness, dizziness or lightheadedness.  She reports she has seen a neurologist in the past for migraines and the neurologist prescribed gabapentin.  Per my review of external records the patient an MRI of the brain performed in 2022 which was normal.  HPI     Home Medications Prior to Admission medications   Medication Sig Start Date End Date Taking? Authorizing Provider  albuterol (VENTOLIN HFA) 108 (90 Base) MCG/ACT inhaler Inhale 2 puffs into the lungs every 6 (six) hours as needed for wheezing or shortness of breath. 04/29/21   Luciano Cutter, MD  ARIPiprazole ER 400 MG SRER Inject 400 mg into the muscle every 30 (thirty) days. Next dose 2/8 03/27/16   Adonis Brook, NP  fluticasone furoate-vilanterol (BREO ELLIPTA) 100-25 MCG/ACT AEPB Inhale 1 puff into the lungs daily. 08/29/21   Luciano Cutter, MD  hydrOXYzine (ATARAX/VISTARIL) 25 MG tablet Take 1 tablet (25 mg total) by mouth every 6 (six) hours as needed for anxiety. 02/27/16   Adonis Brook, NP      Allergies    [place] er]    Review of Systems   Review of Systems  Physical Exam Updated Vital Signs BP 110/89   Pulse 79   Temp 98.4 F (36.9 C) (Oral)   Resp 18   Ht  (1.626 m)   Wt 127 kg   SpO2 92%   BMI 48.06 kg/m  Physical Exam Constitutional:      General: She is not in acute  distress. HENT:     Head: Normocephalic and atraumatic.  Eyes:     General: No visual field deficit.    Extraocular Movements: Extraocular movements intact.     Conjunctiva/sclera: Conjunctivae normal.     Pupils: Pupils are equal, round, and reactive to light.  Cardiovascular:     Rate and Rhythm: Normal rate and regular rhythm.  Pulmonary:     Effort: Pulmonary effort is normal. No respiratory distress.  Abdominal:     General: There is no distension.     Tenderness: There is no abdominal tenderness.  Musculoskeletal:     Cervical back: Normal range of motion and neck supple.  Skin:    General: Skin is warm and dry.  Neurological:     General: No focal deficit present.     Mental Status: She is alert. Mental status is at baseline.     GCS: GCS eye subscore is 4. GCS verbal subscore is 5. GCS motor subscore is 6.     Cranial Nerves: No cranial nerve deficit, dysarthria or facial asymmetry.  Psychiatric:        Mood and Affect: Mood normal.        Behavior: Behavior normal.     ED Results / Procedures / Treatments   Labs (all labs ordered are listed, but only abnormal results are  displayed) Labs Reviewed - No data to display  EKG None  Radiology CT Head Wo Contrast  Result Date: 06/09/2022 CLINICAL DATA:  Headache, concern for sinusitis EXAM: CT HEAD WITHOUT CONTRAST TECHNIQUE: Contiguous axial images were obtained from the base of the skull through the vertex without intravenous contrast. RADIATION DOSE REDUCTION: This exam was performed according to the departmental dose-optimization program which includes automated exposure control, adjustment of the mA and/or kV according to patient size and/or use of iterative reconstruction technique. COMPARISON:  02/02/2013 FINDINGS: Brain: No evidence of acute infarction, hemorrhage, mass, mass effect, or midline shift. No hydrocephalus or extra-axial fluid collection. Vascular: No hyperdense vessel. Skull: Negative for fracture or  focal lesion. Sinuses/Orbits: Clear paranasal sinuses. No evidence of acute sinusitis. The orbits are unremarkable. Other: The mastoid air cells are well aerated. IMPRESSION: No acute intracranial process.  No evidence of sinusitis. Electronically Signed   By: Wiliam Ke M.D.   On: 06/09/2022 13:01    Procedures Procedures    Medications Ordered in ED Medications  dexamethasone (DECADRON) injection 6 mg (6 mg Intravenous Given 06/09/22 1149)  ketorolac (TORADOL) 30 MG/ML injection 30 mg (30 mg Intravenous Given 06/09/22 1149)  metoCLOPramide (REGLAN) injection 10 mg (10 mg Intravenous Given 06/09/22 1149)  sodium chloride 0.9 % bolus 1,000 mL (0 mLs Intravenous Stopped 06/09/22 1343)    ED Course/ Medical Decision Making/ A&P Clinical Course as of 06/09/22 1353  Mon Jun 09, 2022  1353 Patient is feeling much better my reassessment in terms of her headache.  I advised that she follow-up with her neurologist regarding her migraines.  She verbalized understanding. [MT]    Clinical Course User Index [MT] Martha Ellerby, Kermit Balo, MD                             Medical Decision Making Amount and/or Complexity of Data Reviewed Radiology: ordered.  Risk Prescription drug management.   This patient presents to the Emergency Department with complaint of headache.  This involves an extensive number of treatment options, and is a complaint that carries with it a high risk of complications and morbidity.  The differential diagnosis for headache includes tension type headache vs occipital headache vs migraine vs sinusitis vs intracranial bleed vs other  This presentation is most consistent with a complex migraine versus tension type headache.  Also possible sinusitis given the frontal location.  However, given her description of worsening headache pattern I do think neuroimaging would be reasonable at this time, CT scan has been ordered of the brain.  I have a low suspicion for meningitis or SAH/ICH,  I do not see an indication for lumbar puncture or CT angiogram imaging at this time.  Low suspicion for MS or demyelinating lesion.  No optic neuritis symptoms or neurological deficits.  I ordered medication for headache and/or nausea I ordered imaging studies which included CT head  I independently visualized and interpreted imaging which showed no emergent findings and the monitor tracing which showed NSR External records obtained and reviewed showing MRI report 2022   After the interventions stated above, I reevaluated the patient and found that the patient remained clinically stable.  Based on the patient's clinical exam, vital signs, risk factors, and ED testing, I felt that the patient's overall risk of life-threatening emergency such as ICH, meningitis, intracranial mass or tumor was quite low.  I suspect this clinical presentation is most consistent with migraine headache, but explained  to the patient that this evaluation was not a definitive diagnostic workup.  I discussed outpatient follow up with primary care provider, and provided specialist office number on the patient's discharge paper if a referral was deemed necessary.  I discussed return precautions with the patient. I felt the patient was clinically stable for discharge.         Final Clinical Impression(s) / ED Diagnoses Final diagnoses:  Other migraine without status migrainosus, not intractable    Rx / DC Orders ED Discharge Orders     None         Caelen Reierson, Kermit Balo, MD 06/09/22 1354

## 2022-07-07 ENCOUNTER — Other Ambulatory Visit: Payer: Self-pay | Admitting: Physician Assistant

## 2022-07-07 DIAGNOSIS — Z1231 Encounter for screening mammogram for malignant neoplasm of breast: Secondary | ICD-10-CM

## 2022-08-19 ENCOUNTER — Institutional Professional Consult (permissible substitution): Payer: Medicaid Other | Admitting: Primary Care

## 2022-08-19 NOTE — Progress Notes (Deleted)
 @  Patient ID: Angelica [name], female    DOB: [birth year], 42 y.o.   MRN: [id number]  No chief complaint on file.   Referring provider: Norm Salt, PA  HPI: 42 year old female, current every day smoker.  Medical history significant for hypertension, schizophrenia, tobacco abuse. Patient of Dr. Everardo All, last seen on 08/29/21.   08/19/2022 Patient presents today for sleep consult.       Allergies  Allergen Reactions   Invega [Paliperidone Er] Anaphylaxis    AKATHISIA    Immunization History  Administered Date(s) Administered   Influenza,inj,Quad PF,6+ Mos 02/03/2013   Pneumococcal Polysaccharide-23 02/03/2013   Tdap 10/27/2017    Past Medical History:  Diagnosis Date   Medical history non-contributory    Migraine    No pertinent past medical history    Paranoid schizophrenia (HCC)     Tobacco History: Social History   Tobacco Use  Smoking Status Every Day   Packs/day: 0.50   Years: 20.00   Additional pack years: 0.00   Total pack years: 10.00   Types: Cigarettes  Smokeless Tobacco Never  Tobacco Comments   04/29/21 - Hasn't smoked in 3 days   Ready to quit: Not Answered Counseling given: Not Answered Tobacco comments: 04/29/21 - Hasn't smoked in 3 days   Outpatient Medications Prior to Visit  Medication Sig Dispense Refill   albuterol (VENTOLIN HFA) 108 (90 Base) MCG/ACT inhaler Inhale 2 puffs into the lungs every 6 (six) hours as needed for wheezing or shortness of breath. 8 g 2   ARIPiprazole ER 400 MG SRER Inject 400 mg into the muscle every 30 (thirty) days. Next dose 2/8 1 each 0   fluticasone furoate-vilanterol (BREO ELLIPTA) 100-25 MCG/ACT AEPB Inhale 1 puff into the lungs daily. 60 each 2   hydrOXYzine (ATARAX/VISTARIL) 25 MG tablet Take 1 tablet (25 mg total) by mouth every 6 (six) hours as needed for anxiety. 30 tablet 0   No facility-administered medications prior to visit.      Review of Systems  Review of  Systems   Physical Exam  There were no vitals taken for this visit. Physical Exam   Lab Results:  CBC    Component Value Date/Time   WBC 11.2 (H) 04/23/2021 1409   RBC 4.58 04/23/2021 1409   HGB 13.6 04/23/2021 1409   HCT 40.8 04/23/2021 1409   PLT 255 04/23/2021 1409   MCV 89.1 04/23/2021 1409   MCH 29.7 04/23/2021 1409   MCHC 33.3 04/23/2021 1409   RDW 14.7 04/23/2021 1409   LYMPHSABS 3.5 02/19/2016 0625   MONOABS 0.6 02/19/2016 0625   EOSABS 0.4 02/19/2016 0625   BASOSABS 0.0 02/19/2016 0625    BMET    Component Value Date/Time   NA 137 04/23/2021 1409   K 4.0 04/23/2021 1409   CL 106 04/23/2021 1409   CO2 23 04/23/2021 1409   GLUCOSE 89 04/23/2021 1409   BUN 19 04/23/2021 1409   CREATININE 0.86 04/23/2021 1409   CALCIUM 9.0 04/23/2021 1409   GFRNONAA >60 04/23/2021 1409   GFRAA >60 02/19/2016 0625    BNP No results found for: "BNP"  ProBNP No results found for: "PROBNP"  Imaging: No results found.   Assessment & Plan:   No problem-specific Assessment & Plan notes found for this encounter.     Glenford Bayley, NP 08/19/2022

## 2022-08-20 ENCOUNTER — Encounter: Payer: Self-pay | Admitting: Primary Care

## 2023-02-05 ENCOUNTER — Other Ambulatory Visit: Payer: Self-pay | Admitting: Physician Assistant

## 2023-02-05 DIAGNOSIS — Z1231 Encounter for screening mammogram for malignant neoplasm of breast: Secondary | ICD-10-CM

## 2023-02-27 ENCOUNTER — Ambulatory Visit: Payer: MEDICAID

## 2023-03-26 ENCOUNTER — Ambulatory Visit: Payer: MEDICAID

## 2023-08-10 IMAGING — CT CT ANGIO CHEST
2 of 6 series · 18 of 36 positions shown · IV contrast (agent unspecified)
Comparison: None.

CLINICAL DATA: Chest pain, shortness of breath.

EXAM:
CT ANGIOGRAPHY CHEST WITH CONTRAST
TECHNIQUE: Multidetector CT imaging of the chest was performed using the
standard protocol during bolus administration of intravenous
contrast. Multiplanar CT image reconstructions and MIPs were
obtained to evaluate the vascular anatomy.

[Series 5: thins · axial · 0.72mm/px · z∈[-288,-34]mm · 17 of 285 slices shown]
[im 16/285  lung]
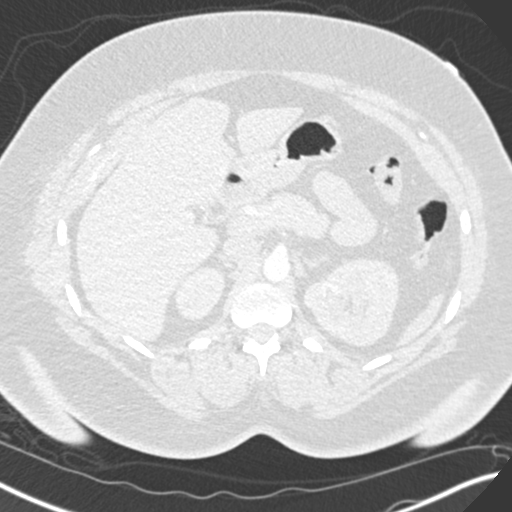
[im 32/285  mediastinal]
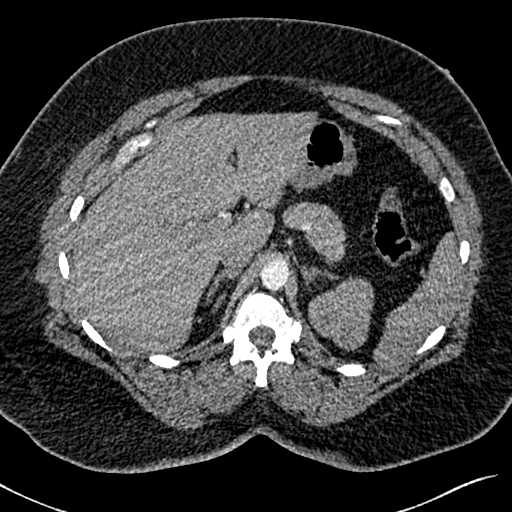
[im 48/285  lung]
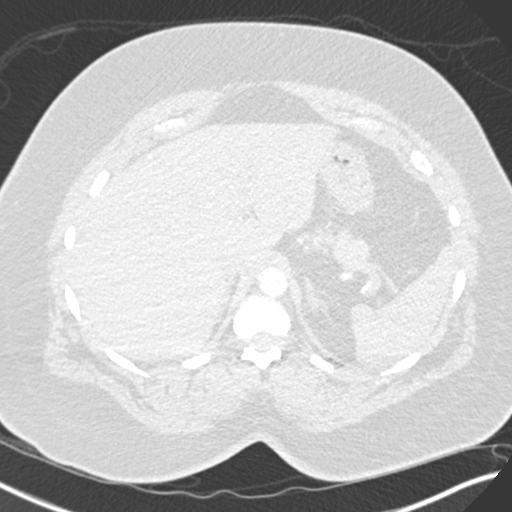
[im 64/285  mediastinal]
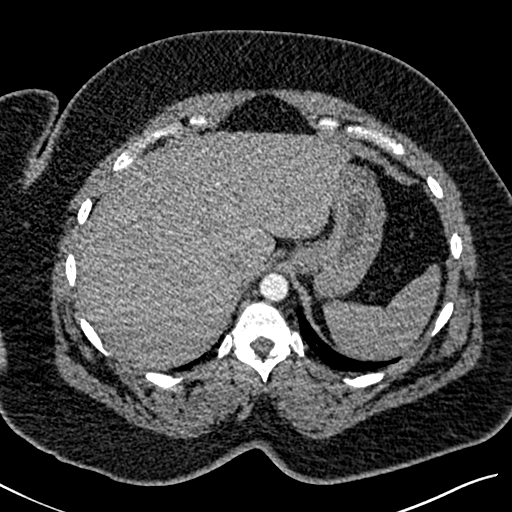
[im 79/285  lung]
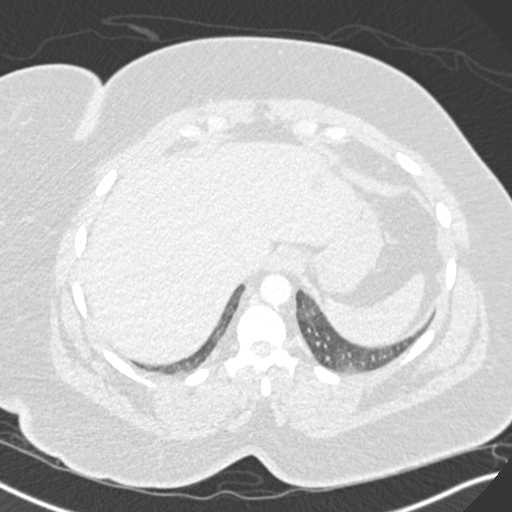
[im 95/285  mediastinal]
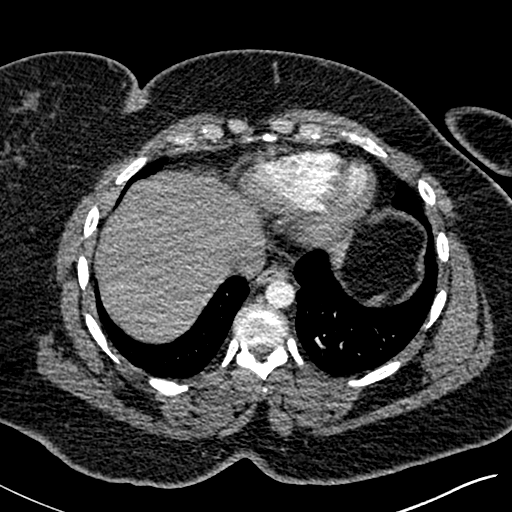
[im 111/285  lung]
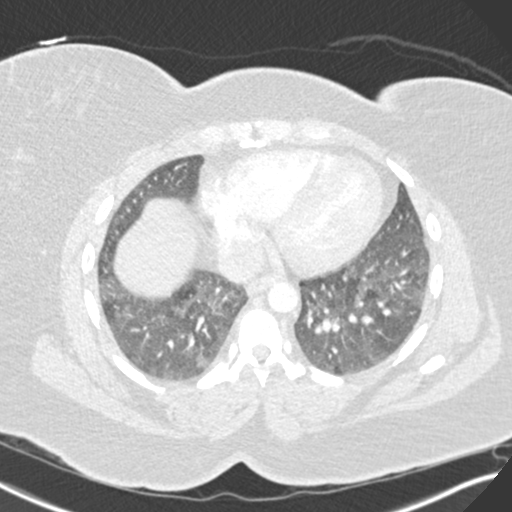
[im 127/285  mediastinal]
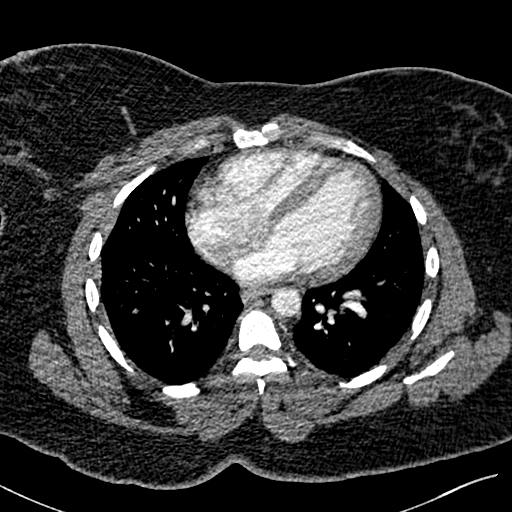
[im 143/285  lung]
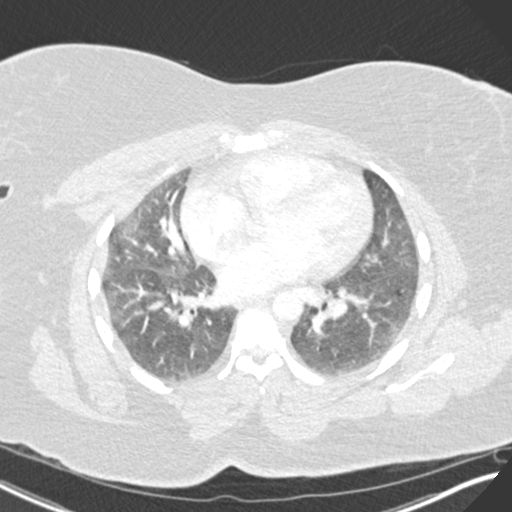
[im 158/285  mediastinal]
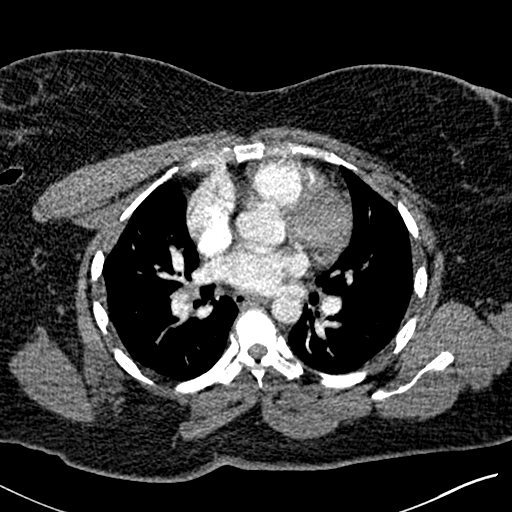
[im 174/285  lung]
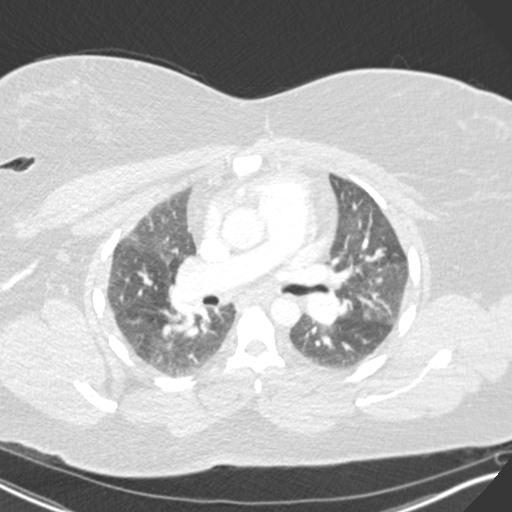
[im 190/285  mediastinal]
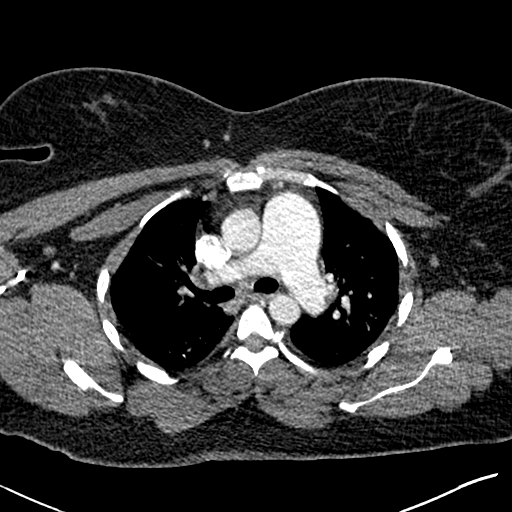
[im 206/285  lung]
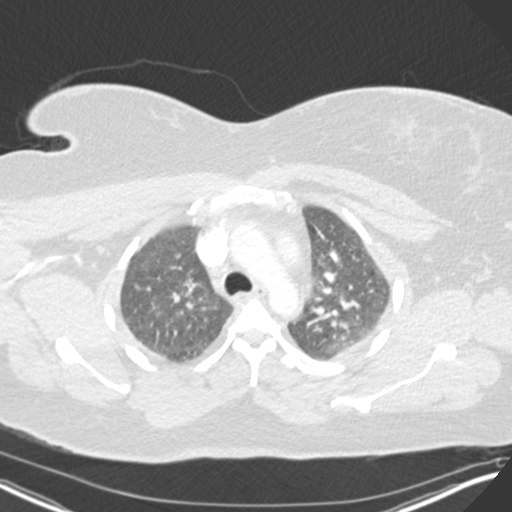
[im 221/285  mediastinal]
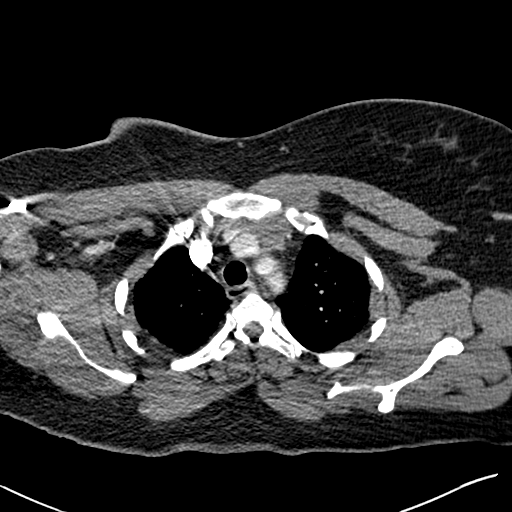
[im 237/285  lung]
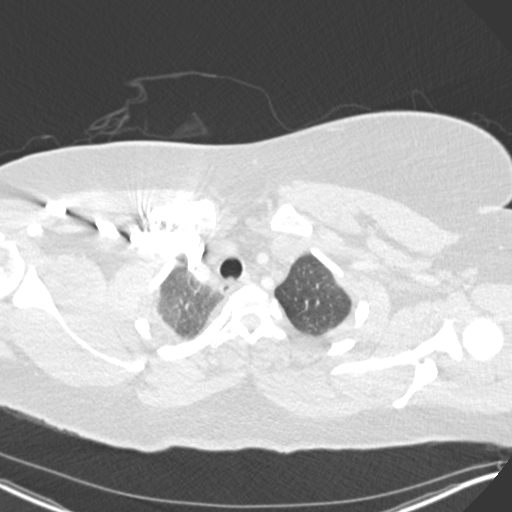
[im 253/285  mediastinal]
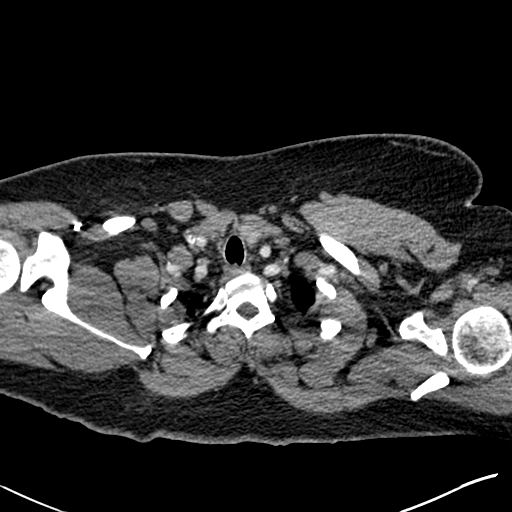
[im 269/285  lung]
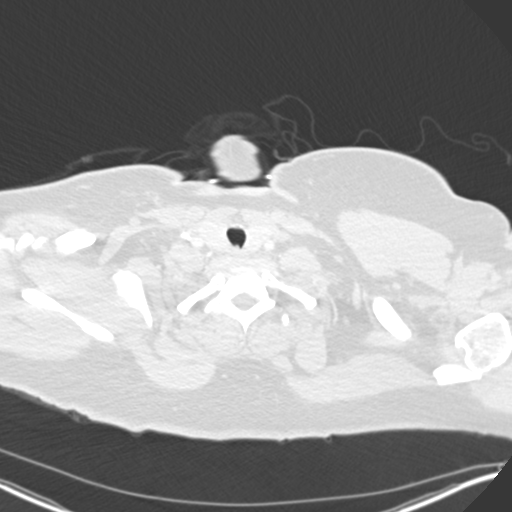

[Series 6: coronal mpr · coronal · 0.62mm/px · 1 of 159 slices shown]
[im 80/159  mediastinal]
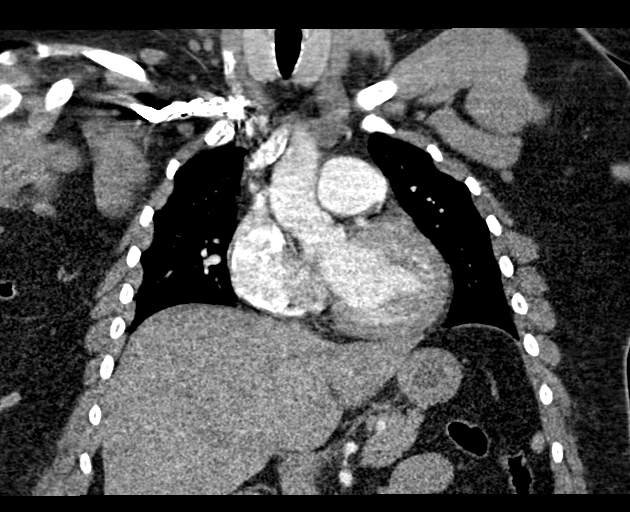

[18 of 36 positions shown; findings below may reference images not displayed]

RADIATION DOSE REDUCTION: This exam was performed according to the
departmental dose-optimization program which includes automated
exposure control, adjustment of the mA and/or kV according to
patient size and/or use of iterative reconstruction technique.

CONTRAST:  100mL OMNIPAQUE IOHEXOL 350 MG/ML SOLN
FINDINGS: Cardiovascular: Satisfactory opacification of the pulmonary arteries
to the segmental level. No evidence of pulmonary embolism. Normal
heart size. No pericardial effusion. Enlarged pulmonary artery is
noted suggesting pulmonary artery hypertension.

Mediastinum/Nodes: No enlarged mediastinal, hilar, or axillary lymph
nodes. Thyroid gland, trachea, and esophagus demonstrate no
significant findings.

Lungs/Pleura: No pneumothorax or pleural effusion is noted. Mild
mosaic pattern is noted throughout both lungs suggesting air
trapping secondary to small airways disease or possibly mild
multifocal inflammation.

Upper Abdomen: No acute abnormality.

Musculoskeletal: No chest wall abnormality. No acute or significant
osseous findings.

Review of the MIP images confirms the above findings.
IMPRESSION: No definite evidence of pulmonary embolus.

Enlarged pulmonary artery is noted suggesting pulmonary artery
hypertension.

Mild mosaic pattern is noted throughout both lungs suggesting air
trapping secondary to small airways disease or possibly mild
multifocal inflammation.

## 2023-08-27 ENCOUNTER — Telehealth: Payer: Self-pay | Admitting: Pulmonary Disease

## 2023-08-27 NOTE — Telephone Encounter (Signed)
*  Submitted error regarding appt scheduled w/ Dr. Annella. Outcome resulted in patient refusal of rescheduling. Changed appt to New Patient Appt due to her last visit was in 2023 and considered re-establishing care. Patient is aware of provider not completing sleep, but has since scheduled to f/u with Tammy Parrett in relation to sleep in Sept and referral attached to this visit to R/O OSA.   - Nothing further needed at this time.    Copied from CRM [id number]. Topic: E2C2 Error Reporting - Scheduling Error >> Aug 12, 2023  3:42 PM Angelica [name] wrote: This CRM was created to begin the process of an appointment error investigation. Please see the corresponding attachments, forms, and notes for details. >> Aug 19, 2023  4:26 PM Angelica [name] wrote: The PAS called and spoke with the patient to get her rescheduled with a Sleep Provider. Patient states that she normally sees Dr. Kassie but did not want to wait until August to see her. Patient states that she wants to keep her appointment with [facility] on 7/14. She also mentions that she needed both appointments due to missing her sleep appointment last year, but then patient was adamant about not wanting to reschedule sleep appointment.   Submitted error regarding appt scheduled w/ Dr. Annella. Outcome resulted in patient refusal of rescheduling. Changed appt to New Patient Appt due to her last visit was in 2023 and considered re-establishing care. Patient is aware of provider not completing sleep, but has since scheduled to f/u with Tammy Parrett in relation to sleep in Sept and referral attached to this visit to R/O OSA.   Nothing further needed at this time.

## 2023-08-31 ENCOUNTER — Encounter: Payer: Self-pay | Admitting: Pulmonary Disease

## 2023-08-31 ENCOUNTER — Ambulatory Visit (INDEPENDENT_AMBULATORY_CARE_PROVIDER_SITE_OTHER): Payer: MEDICAID | Admitting: Pulmonary Disease

## 2023-08-31 VITALS — BP 124/70 | HR 85 | Temp 98.3°F | Ht 63.0 in | Wt 278.8 lb

## 2023-08-31 DIAGNOSIS — R0609 Other forms of dyspnea: Secondary | ICD-10-CM | POA: Diagnosis not present

## 2023-08-31 DIAGNOSIS — F17210 Nicotine dependence, cigarettes, uncomplicated: Secondary | ICD-10-CM

## 2023-08-31 MED ORDER — ALBUTEROL SULFATE HFA 108 (90 BASE) MCG/ACT IN AERS: 2.0000 | INHALATION_SPRAY | Freq: Four times a day (QID) | RESPIRATORY_TRACT | 11 refills | Status: AC | PRN

## 2023-08-31 MED ORDER — FLUTICASONE-SALMETEROL 500-50 MCG/ACT IN AEPB: 1.0000 | INHALATION_SPRAY | Freq: Two times a day (BID) | RESPIRATORY_TRACT | 3 refills | Status: AC

## 2023-08-31 NOTE — Patient Instructions (Addendum)
 It is nice to meet you  I refilled albuterol , 2 puffs every 6 hours as needed for shortness of breath.  This is your rescue inhaler.  I sent a new prescription for Advair discus 1 puff twice a day every day, rinse your mouth out thoroughly with water after every use.  This is your maintenance or every day inhaler.  If the medication Advair is too expensive please call us  and let us  know we can look for more cost effective alternative.  We discussed gradually cutting back your cigarettes, 1 to 2 cigarettes every 1 to 2 weeks.  I would portion the amount.  So that you know the exact number you can smoke.  To slowly ration them through the day.  Return to clinic in 3 months with Dr. Kassie

## 2023-08-31 NOTE — Progress Notes (Signed)
 Subjective:   PATIENT ID: Angelica [name] GENDER: female DOB: 02-06-1981, MRN: [id number]   HPI  Chief Complaint  Patient presents with  . Consult    Pt is experiencing a small amount of SOB. She states that it does not get worse with exertion but that she feels it when she is both moving and at rest.  She would like to talk about quitting smoking as well.     Reason for Visit: Follow-up shortness of breath  Angelica [name] is a 43 y.o. female active smoker with schizophrenia who presents for follow-up.  Initial consult She was recently seen in the ED on 04/23/2021 for chest pain/pressure not associated with exertion but exacerbated by it.  Associated with shortness of breath.  ED note reviewed.  Work-up included negative D-dimer, troponin and borderline leukocytosis.  She was treated with GI cocktail which improved her chest pain.  She was referred to cardiology and pulmonology.  She was seen by Cardiology on 04/25/2021 for evaluation of atypical chest pain and advised for lifestyle modification and CVD risk medication.  She is an active smoker for 20 years. 1/2 ppd day. Does not vape. Shortness of breath with and without activity associated with activity. Some wheezing. Denies coughing. Has smoker's non-productive cough in the morning. Denies childhood illnesses.  She reports snoring at night. She naps during the week 2-3 times. Denies falling when working or talking to others. Will fall asleep when not engaged with anyone.   08/29/21 Denies shortness of breath, coughing or wheezing. She continues to have chest tightness that improves with albuterol . Seems to occur activity including house chores. Quit smoking 1 month ago. She reports sleeping 6-8 hours nightly. Does snore but no one available to witness apnea. Wakes up with morning headaches. Denies excessive daytime sleepiness.  08/31/2023 Last seen 08/2021 with 78-month follow-up requested.  She never has followed up.  She called  in for an appointment would not wait till next month to see her primary pulmonologist.  T  Previously complained of chest pressure and shortness of breath improved with albuterol .  Last prescribed Breo low-dose 08/2021.  PFT 08/2021 on my review interpretation reveal air trapping, mildly reduced DLCO.  Otherwise okay PFTs.  Today her chief complaint is shortness of breath.  At rest and with exertion.  A little bit of cough but not too much.  Smoking about half a pack a day.  She like to cut back.  She states albuterol  helps.  But she is run out for some time.  She was prescribed Breo in the past.  She never picked this up.  Never took it.  It seems like was too expensive.  She is not sure how much it cost.  She would like to cut back on smoking.  She states nicotine  replacement has not worked well in the past.  We discussed pharmacologic options.  We discussed strategy of slowly cutting back, decreasing cigarette use over time to minimize withdrawal symptoms with also the display clear goals and achieving clear goals which is powerful psychologically.  I offered her pharmacological treatment.  She preferred to try to cut back on her own for couple months and then reassess.  Chest imaging 04/2021 reviewed that shows mosaicism, concerning for small airways disease on my review and interpretation.  Social History: Current smoker 1/2 ppd  Past Medical History:  Diagnosis Date  . Medical history non-contributory   . Migraine   . No pertinent past medical history   .  Paranoid schizophrenia (HCC)      Family History  Problem Relation Age of Onset  . Mental illness Neg Hx      Social History   Occupational History  . Not on file  Tobacco Use  . Smoking status: Every Day    Current packs/day: 0.50    Average packs/day: 0.5 packs/day for 20.0 years (10.0 ttl pk-yrs)    Types: Cigarettes  . Smokeless tobacco: Never  Vaping Use  . Vaping status: Never Used  Substance and Sexual Activity  .  Alcohol use: No  . Drug use: No  . Sexual activity: Yes    Birth control/protection: Condom    Allergies  Allergen Reactions  . Invega  [Paliperidone  Er] Anaphylaxis    AKATHISIA     Outpatient Medications Prior to Visit  Medication Sig Dispense Refill  . ARIPiprazole  ER 400 MG SRER Inject 400 mg into the muscle every 30 (thirty) days. Next dose 2/8 1 each 0  . hydrOXYzine  (ATARAX /VISTARIL ) 25 MG tablet Take 1 tablet (25 mg total) by mouth every 6 (six) hours as needed for anxiety. 30 tablet 0  . albuterol  (VENTOLIN  HFA) 108 (90 Base) MCG/ACT inhaler Inhale 2 puffs into the lungs every 6 (six) hours as needed for wheezing or shortness of breath. 8 g 2  . fluticasone  furoate-vilanterol (BREO ELLIPTA ) 100-25 MCG/ACT AEPB Inhale 1 puff into the lungs daily. 60 each 2   No facility-administered medications prior to visit.    Review of systems: N/A   Objective:   Vitals:   08/31/23 1100  BP: 124/70  Pulse: 85  Temp: 98.3 F (36.8 C)  TempSrc: Oral  SpO2: 97%  Weight: 278 lb 12.8 oz (126.5 kg)  Height: 5' 3 (1.6 m)   SpO2: 97 % O2 Device: None (Room air)  Body mass index is 49.39 kg/m.  Physical Exam: General: Well-appearing, no acute distress HENT: , AT Eyes: EOMI, no scleral icterus Respiratory: Clear to auscultation bilaterally.  No crackles, wheezing or rales Cardiovascular: RRR, -M/R/G, no JVD Extremities:-Edema,-tenderness Neuro: AAO x4, CNII-XII grossly intact Psych: Normal mood, normal affect  Data Reviewed:  Imaging: CTA 04/23/2021-no pulmonary embolus.  Enlarged pulmonary artery concerning for PAH.  Mild mosaic pattern suggesting air trapping  PFT: 08/29/21 FVC 2.04 (55%) FEV1 1.85 (61%) Ratio 85  TLC 85% DLCO 70% Interpretation: No obstructive defect is present. Reduced FVC and FEV1 however normal lung volumes. Mildly reduced DLCO.   Labs: CBC    Component Value Date/Time   WBC 11.2 (H) 04/23/2021 1409   RBC 4.58 04/23/2021 1409   HGB 13.6  04/23/2021 1409   HCT 40.8 04/23/2021 1409   PLT 255 04/23/2021 1409   MCV 89.1 04/23/2021 1409   MCH 29.7 04/23/2021 1409   MCHC 33.3 04/23/2021 1409   RDW 14.7 04/23/2021 1409   LYMPHSABS 3.5 02/19/2016 0625   MONOABS 0.6 02/19/2016 0625   EOSABS 0.4 02/19/2016 0625   BASOSABS 0.0 02/19/2016 0625   Absolute eos 02/19/16 - 400     Assessment & Plan:   Discussion: 43 y.o. female active smoker with schizophrenia who presents for follow-up for shortness of breath.  Really unchanged over time.  Lost to follow-up for 2 years.  Never tried a maintenance inhaler.  I think this is most likely her highest yield intervention.  See below.  Consider workup for possible pulmonary hypertension in the future.  Echocardiogram ordered in the past never done.  Suspected asthma Shortness of breath Chest tightness/atypical chest pain START Advair  discus high-dose 1 puff twice a day, new prescription, this is EVERYDAY inhaler CONTINUE Albuterol  AS NEEDED for shortness of breath or wheezing, refilled today. This is your RESCUE inhaler  Tobacco abuse Smoking assessment and cessation counseling I have advised the patient to quit/stop smoking as soon as possible due to high risk for multiple medical problems.  It will also be very difficult for us  to manage patient's  respiratory symptoms and status if we continue to expose her lungs to a known irritant.  We do not advise e-cigarettes as a form of stopping smoking. Patient is willing to quit smoking. I have advised the patient that we can assist and have options of nicotine  replacement therapy, provided smoking cessation education today, provided smoking cessation counseling, and provided cessation resources.  We discussed pharmacologic options.  We discussed nicotine  replacement.  We discussed gradual reduction in cigarettes over time with clear goal every week or 2, decreasing by 1 to 2 cigarettes a week.  She would like to try the latter option. Follow-up next  office visit office visit for assessment of smoking cessation.  I spent 3 minutes on tobacco cessation counseling.   Health Maintenance Immunization History  Administered Date(s) Administered  . Influenza,inj,Quad PF,6+ Mos 02/03/2013  . Pneumococcal Polysaccharide-23 02/03/2013  . Tdap 10/27/2017   CT Lung Screen - not qualified due to age  No orders of the defined types were placed in this encounter.  Meds ordered this encounter  Medications  . albuterol  (VENTOLIN  HFA) 108 (90 Base) MCG/ACT inhaler    Sig: Inhale 2 puffs into the lungs every 6 (six) hours as needed for wheezing or shortness of breath.    Dispense:  1 each    Refill:  11  . fluticasone -salmeterol (ADVAIR DISKUS) 500-50 MCG/ACT AEPB    Sig: Inhale 1 puff into the lungs in the morning and at bedtime.    Dispense:  1 each    Refill:  3    Return in about 3 months (around 12/01/2023) for f/u Dr. Kassie.  I have spent a total time of 45 minutes on the day of the appointment including chart review, data review, collecting history, coordinating care and discussing medical diagnosis and plan with the patient/family. Past medical history, allergies, medications were reviewed. Pertinent imaging, labs and tests included in this note have been reviewed and interpreted independently by me.  Donnice JONELLE Beals, MD [facility] 08/31/2023 11:22 AM

## 2023-09-04 ENCOUNTER — Other Ambulatory Visit (HOSPITAL_COMMUNITY): Payer: Self-pay

## 2023-09-04 ENCOUNTER — Telehealth: Payer: Self-pay

## 2023-09-04 NOTE — Telephone Encounter (Signed)
*  Pulm  Pharmacy Patient Advocate Encounter   Received notification from Fax that prior authorization for [place] is required/requested.   Insurance verification completed.   The patient is insured through [employer] [place] .   Per test claim:  Brand Advair Diskus is preferred by the insurance.  If suggested medication is appropriate, Please send in a new RX and discontinue this one. If not, please advise as to why it's not appropriate so that we may request a Prior Authorization. Please note, some preferred medications may still require a PA.  If the suggested medications have not been trialed and there are no contraindications to their use, the PA will not be submitted, as it will not be approved.   *refill too soon- last filled 09/01/2023

## 2023-10-20 ENCOUNTER — Ambulatory Visit: Payer: MEDICAID | Admitting: Adult Health

## 2023-12-01 ENCOUNTER — Ambulatory Visit (HOSPITAL_BASED_OUTPATIENT_CLINIC_OR_DEPARTMENT_OTHER): Payer: MEDICAID | Admitting: Pulmonary Disease

## 2023-12-01 ENCOUNTER — Encounter (HOSPITAL_BASED_OUTPATIENT_CLINIC_OR_DEPARTMENT_OTHER): Payer: Self-pay | Admitting: Pulmonary Disease

## 2023-12-09 ENCOUNTER — Ambulatory Visit: Payer: MEDICAID | Admitting: Adult Health

## 2024-02-22 ENCOUNTER — Ambulatory Visit: Payer: MEDICAID | Admitting: Adult Health

## 2024-03-24 ENCOUNTER — Ambulatory Visit: Payer: MEDICAID | Admitting: Adult Health

## 2024-05-09 ENCOUNTER — Ambulatory Visit: Payer: MEDICAID | Admitting: Adult Health
